# Patient Record
Sex: Male | Born: 1950
Health system: Southern US, Community
[De-identification: ages and names within clinical notes are randomized; demographics above are authoritative.]

## PROBLEM LIST (undated history)

## (undated) DIAGNOSIS — G809 Cerebral palsy, unspecified: Secondary | ICD-10-CM

## (undated) DIAGNOSIS — K0510 Chronic gingivitis, plaque induced: Secondary | ICD-10-CM

## (undated) DIAGNOSIS — R569 Unspecified convulsions: Secondary | ICD-10-CM

## (undated) DIAGNOSIS — D171 Benign lipomatous neoplasm of skin and subcutaneous tissue of trunk: Secondary | ICD-10-CM

## (undated) DIAGNOSIS — E119 Type 2 diabetes mellitus without complications: Secondary | ICD-10-CM

## (undated) HISTORY — PX: OTHER SURGICAL HISTORY: SHX169

---

## 2006-05-26 ENCOUNTER — Emergency Department (HOSPITAL_COMMUNITY): Admission: EM | Admit: 2006-05-26 | Discharge: 2006-05-26 | Payer: Self-pay | Admitting: Emergency Medicine

## 2011-05-29 ENCOUNTER — Emergency Department (HOSPITAL_COMMUNITY)
Admission: EM | Admit: 2011-05-29 | Discharge: 2011-05-29 | Disposition: A | Discharge status: Home / Self Care | Payer: Medicaid Other | Attending: Emergency Medicine | Admitting: Emergency Medicine

## 2011-05-29 ENCOUNTER — Encounter (HOSPITAL_COMMUNITY): Payer: Self-pay

## 2011-05-29 DIAGNOSIS — S01309A Unspecified open wound of unspecified ear, initial encounter: Secondary | ICD-10-CM | POA: Insufficient documentation

## 2011-05-29 DIAGNOSIS — Z79899 Other long term (current) drug therapy: Secondary | ICD-10-CM | POA: Insufficient documentation

## 2011-05-29 DIAGNOSIS — W19XXXA Unspecified fall, initial encounter: Secondary | ICD-10-CM

## 2011-05-29 DIAGNOSIS — W06XXXA Fall from bed, initial encounter: Secondary | ICD-10-CM | POA: Insufficient documentation

## 2011-05-29 DIAGNOSIS — S01311A Laceration without foreign body of right ear, initial encounter: Secondary | ICD-10-CM

## 2011-05-29 DIAGNOSIS — G809 Cerebral palsy, unspecified: Secondary | ICD-10-CM | POA: Insufficient documentation

## 2011-05-29 HISTORY — DX: Unspecified convulsions: R56.9

## 2011-05-29 HISTORY — DX: Cerebral palsy, unspecified: G80.9

## 2011-05-29 NOTE — ED Provider Notes (Signed)
History   This chart was scribed for Lyanne Co, MD by Clarita Crane. The patient was seen in room APA04/APA04 and the patient's care was started at 7:18AM.   CSN: [id number]  Arrival date & time 05/29/11  4098   First MD Initiated Contact with Patient 05/29/11 [phone]      Chief Complaint  Patient presents with  . Fall    (Consider location/radiation/quality/duration/timing/severity/associated sxs/prior treatment) HPI Jeremy Parks is a 61 y.o. male who presents to the Emergency Department to be evaluated after sustaining a fall this morning after attempting to get out of bed and having his foot slip from underneath him and falling backwards. Patient indicates striking the posterior aspect of his head on the bed during fall and sustaining moderate laceration to right ear in process of fall. Denies HA, LOC, neck pain, chest pain, abdominal pain, numbness, tingling, seizure, fever, nausea, vomiting. Patient with h/o cerebral palsy and seizures.  Past Medical History  Diagnosis Date  . Cerebral palsy   . Seizures     History reviewed. No pertinent past surgical history.  No family history on file.  History  Substance Use Topics  . Smoking status: Never Smoker   . Smokeless tobacco: Not on file  . Alcohol Use: No  -Lives in group home    Review of Systems 10 Systems reviewed and are negative for acute change except as noted in the HPI.  Allergies  Review of patient's allergies indicates no known allergies.  Home Medications   Current Outpatient Rx  Name Route Sig Dispense Refill  . CARBAMAZEPINE 200 MG PO TABS Oral Take 200 mg by mouth 2 (two) times daily.    . CHLORHEXIDINE GLUCONATE 0.12 % MT SOLN Mouth/Throat Use as directed 15 mLs in the mouth or throat 2 (two) times daily.    . IBUPROFEN 800 MG PO TABS Oral Take 800 mg by mouth every 6 (six) hours as needed.      BP 143/80  Pulse 63  Temp(Src) 97.6 F (36.4 C) (Oral)  Resp 20  Ht 5\' 9"  (1.753 m)  Wt 200 lb  (90.719 kg)  BMI 29.53 kg/m2  SpO2 100%  Physical Exam  Nursing note and vitals reviewed. Constitutional: He is oriented to person, place, and time. He appears well-developed and well-nourished.  HENT:  Head: Normocephalic.  Left Ear: External ear normal.       0.75cm laceration to right superior ear involving the helix. No evidence of hematoma to right ear noted.   Eyes: EOM are normal.  Neck: Normal range of motion. Neck supple.       C-spine non-tender.   Cardiovascular: Normal rate and regular rhythm.  Exam reveals no gallop and no friction rub.   No murmur heard. Pulmonary/Chest: Effort normal. No respiratory distress. He has no wheezes. He has no rales.  Abdominal: Soft. He exhibits no distension. There is no tenderness.  Musculoskeletal: Normal range of motion. He exhibits no tenderness.       No C-spine tenderness. No step-offs noted. FROM of all major joints. Chronic deformity of left ankle.   Neurological: He is alert and oriented to person, place, and time.       55 strength in major muscle groups bilaterally of both upper and lower extremities. No facial asymetry  Skin: Skin is warm and dry.  Psychiatric: He has a normal mood and affect.    ED Course  Procedures (including critical care time)  LACERATION REPAIR PROCEDURE NOTE The patient's  identification was confirmed and consent was obtained. This procedure was performed by Lyanne Co, MD at 7:27 AM.  Site: superior aspect of helix of right ear Sterile procedures observed Anesthetic used (type and amt): None Suture type/size: 5-0 Vicryl Length: 0.75cm # of Sutures: 4 Technique:Simple Interrupted  Complexity: Complex Antibx ointment applied Tetanus UTD or ordered  Site anesthetized, irrigated with NS, explored without evidence of foreign body, wound well approximated, site covered with dry, sterile dressing.  Patient tolerated procedure well without complications. Instructions for care discussed verbally  and patient provided with additional written instructions for homecare and f/u.  DIAGNOSTIC STUDIES: Oxygen Saturation is 100% on room air, normal by my interpretation.    COORDINATION OF CARE: 7:20AM- Patient advised of intent to perform laceration repair to right ear and consents to having procedure performed.  7:26AM-Jeremy Macy Larena Glassman, MD to bedside to perform laceration repair.  7:35AM- Patient informed of intent to d/c home. Patient agrees with plan set forth at this time.  Labs Reviewed - No data to display No results found.   1. Fall   2. Laceration of right ear, external       MDM  Isolated laceration to right external ear helix.  Stitches placed.  Tetanus up to date.  Mechanical fall.  No indication for head imaging.  No loss consciousness.  Normal neurologic exam.      I personally performed the services described in this documentation, which was scribed in my presence. The recorded information has been reviewed and considered.    Lyanne Co, MD 05/29/11 [phone]

## 2011-05-29 NOTE — ED Notes (Signed)
Brought to er by ems, pt stated he rolled out of bed, small laceration to right ear, denies loc

## 2011-05-29 NOTE — ED Notes (Signed)
[facility] [phone].  Gave report to Lupita Leash.  Called ems to transport.

## 2011-06-09 ENCOUNTER — Emergency Department (HOSPITAL_COMMUNITY): Payer: Medicaid Other

## 2011-06-09 ENCOUNTER — Encounter (HOSPITAL_COMMUNITY): Payer: Self-pay | Admitting: *Deleted

## 2011-06-09 ENCOUNTER — Emergency Department (HOSPITAL_COMMUNITY)
Admission: EM | Admit: 2011-06-09 | Discharge: 2011-06-10 | Disposition: A | Discharge status: Other Acute Inpatient Hospital | Payer: Medicaid Other | Attending: Emergency Medicine | Admitting: Emergency Medicine

## 2011-06-09 DIAGNOSIS — G809 Cerebral palsy, unspecified: Secondary | ICD-10-CM | POA: Insufficient documentation

## 2011-06-09 DIAGNOSIS — D179 Benign lipomatous neoplasm, unspecified: Secondary | ICD-10-CM

## 2011-06-09 DIAGNOSIS — D175 Benign lipomatous neoplasm of intra-abdominal organs: Secondary | ICD-10-CM | POA: Insufficient documentation

## 2011-06-09 DIAGNOSIS — R1900 Intra-abdominal and pelvic swelling, mass and lump, unspecified site: Secondary | ICD-10-CM | POA: Insufficient documentation

## 2011-06-09 DIAGNOSIS — R569 Unspecified convulsions: Secondary | ICD-10-CM | POA: Insufficient documentation

## 2011-06-09 LAB — DIFFERENTIAL
Basophils Absolute: 0 10*3/uL (ref 0.0–0.1)
Basophils Relative: 0 % (ref 0–1)
Eosinophils Absolute: 0.1 10*3/uL (ref 0.0–0.7)
Eosinophils Relative: 2 % (ref 0–5)
Lymphocytes Relative: 37 % (ref 12–46)
Lymphs Abs: 2.3 10*3/uL (ref 0.7–4.0)
Monocytes Absolute: 0.6 10*3/uL (ref 0.1–1.0)
Monocytes Relative: 10 % (ref 3–12)
Neutro Abs: 3.2 10*3/uL (ref 1.7–7.7)
Neutrophils Relative %: 51 % (ref 43–77)

## 2011-06-09 LAB — BASIC METABOLIC PANEL
BUN: 14 mg/dL (ref 6–23)
CO2: 31 mEq/L (ref 19–32)
Calcium: 10.5 mg/dL (ref 8.4–10.5)
Chloride: 99 mEq/L (ref 96–112)
Creatinine, Ser: 0.89 mg/dL (ref 0.50–1.35)
GFR calc Af Amer: >90 mL/min (ref >90)
GFR calc non Af Amer: >90 mL/min (ref >90)
Glucose, Bld: 129 mg/dL — ABNORMAL HIGH (ref 70–99)
Potassium: 4.7 mEq/L (ref 3.5–5.1)
Sodium: 137 mEq/L (ref 135–145)

## 2011-06-09 LAB — CBC
HCT: 46 % (ref 39.0–52.0)
Hemoglobin: 15.6 g/dL (ref 13.0–17.0)
MCH: 29.6 pg (ref 26.0–34.0)
MCHC: 33.9 g/dL (ref 30.0–36.0)
MCV: 87.3 fL (ref 78.0–100.0)
Platelets: 163 10*3/uL (ref 150–400)
RBC: 5.27 MIL/uL (ref 4.22–5.81)
RDW: 12.4 % (ref 11.5–15.5)
WBC: 6.2 10*3/uL (ref 4.0–10.5)

## 2011-06-09 MED ORDER — IOHEXOL 300 MG/ML  SOLN
100.0000 mL | Freq: Once | INTRAMUSCULAR | Status: AC | PRN
  Administered 2011-06-09: 100 mL via INTRAVENOUS

## 2011-06-09 NOTE — ED Provider Notes (Signed)
History    This chart was scribed for Jeremy Lennert, MD, MD by Smitty Pluck. The patient was seen in room APA04 and the patient's care was started at 7:06PM.   CSN: [id number]  Arrival date & time 06/09/11  1753   First MD Initiated Contact with Patient 06/09/11 1903      Chief Complaint  Patient presents with  . Abdominal Pain    (Consider location/radiation/quality/duration/timing/severity/associated sxs/prior treatment) The history is provided by the patient.   Jeremy Parks is a 61 y.o. male who presents to the Emergency Department complaining of abdominal swelling onset 2 weeks. Pt reports falling out of bed 2 weeks ago and landing on the floor. Pt denies nausea, vomiting and diarrhea. Pt is from [facility]. Pt reports that he does not have abdominal pain.   Past Medical History  Diagnosis Date  . Cerebral palsy   . Seizures     History reviewed. No pertinent past surgical history.  History reviewed. No pertinent family history.  History  Substance Use Topics  . Smoking status: Never Smoker   . Smokeless tobacco: Not on file  . Alcohol Use: No      Review of Systems  All other systems reviewed and are negative.  10 Systems reviewed and are negative for acute change except as noted in the HPI.   Allergies  Review of patient's allergies indicates no known allergies.  Home Medications   Current Outpatient Rx  Name Route Sig Dispense Refill  . CARBAMAZEPINE 200 MG PO TABS Oral Take 200 mg by mouth 2 (two) times daily.    . CHLORHEXIDINE GLUCONATE 0.12 % MT SOLN Mouth/Throat Use as directed 15 mLs in the mouth or throat 2 (two) times daily. **Swish 15 mls for 30 seconds then spit out. USE DAILY**    . IBUPROFEN 800 MG PO TABS Oral Take 800 mg by mouth every 6 (six) hours as needed.      BP 137/77  Pulse 91  Temp(Src) 97.1 F (36.2 C) (Oral)  Resp 17  Ht 5\' 8"  (1.727 m)  Wt 200 lb (90.719 kg)  BMI 30.41 kg/m2  SpO2  97%  Physical Exam  Nursing note and vitals reviewed. Constitutional: He is oriented to person, place, and time. He appears well-developed and well-nourished. No distress.  HENT:  Head: Normocephalic and atraumatic.  Eyes: Conjunctivae and EOM are normal. No scleral icterus.  Neck: Neck supple. No thyromegaly present.  Cardiovascular: Normal rate and regular rhythm.  Exam reveals no gallop and no friction rub.   No murmur heard. Pulmonary/Chest: No stridor. He has no wheezes. He has no rales. He exhibits no tenderness.  Abdominal: He exhibits mass (LUQ 4x4). He exhibits no distension. There is no tenderness. There is no rebound.  Musculoskeletal: Normal range of motion. He exhibits no edema.  Lymphadenopathy:    He has no cervical adenopathy.  Neurological: He is oriented to person, place, and time. Coordination normal.  Skin: No rash noted. No erythema.  Psychiatric: He has a normal mood and affect. His behavior is normal.    ED Course  Procedures (including critical care time) DIAGNOSTIC STUDIES: Oxygen Saturation is 97% on room air, normal by my interpretation.    COORDINATION OF CARE: 7:15 EDP discussed pt ED treatment course with pt   Labs Reviewed  BASIC METABOLIC PANEL - Abnormal; Notable for the following:    Glucose, Bld 129 (*)    All other components within normal limits  CBC  DIFFERENTIAL   Ct Abdomen Pelvis W Contrast  06/09/2011  *RADIOLOGY REPORT*  Clinical Data: Left upper quadrant swelling.  CT ABDOMEN AND PELVIS WITH CONTRAST  Technique:  Multidetector CT imaging of the abdomen and pelvis was performed following the standard protocol during bolus administration of intravenous contrast.  Contrast: OMNIPAQUE IOHEXOL 300 MG/ML IV SOLN  Comparison: None.  Findings: Limited images through the lung bases demonstrate no significant appreciable abnormality. The heart size is within normal limits. No pleural or pericardial effusion.  Unremarkable liver, spleen,  pancreas, adrenal glands.  Symmetric renal enhancement.  No hydronephrosis or hydroureter.  Small bowel loops are normal course and caliber.  There is a moderate stool burden.  The proximal sigmoid colon is redundant and distended with air, up to 5.9 cm.  There is an abrupt transition in the left lower abdomen (image 50 of series 2).  Distal to this point, the sigmoid colon is entirely decompressed.  No mass, mesenteric twisting, or significant pericolonic inflammation identified in this region.  Mild colonic diverticulosis.  No free intraperitoneal air or fluid.  Normal caliber vasculature.  Thin-walled bladder.  Fat containing right greater than left inguinal hernias.  Multilevel degenerative changes of the imaged spine. No acute or aggressive appearing osseous lesion.  IMPRESSION: Redundant sigmoid colon with marked gaseous distension proximally and abrupt transition in the left lower abdomen to decompressed colon distally. This is a nonspecific finding on this static examination. No twisting to confirm volvulus.  Partial obstruction due to stricture or adhesion not excluded. Recommend a follow-up KUB to document contrast passage into the rectum and/or definitive characterization with colonoscopy to exclude an underlying lesion.  Original Report Authenticated By: Waneta Martins, M.D.     No diagnosis found.  I spoke with the radiologist and he felt that the swelling in his intestines was probably due to chronic constipation issues. The patient does not have any abdominal pain or tenderness. He just has the large fatty tumor in his left upper abd  MDM  Fatty tumor  The chart was scribed for me under my direct supervision.  I personally performed the history, physical, and medical decision making and all procedures in the evaluation of this patient.Jeremy Lennert, MD 06/09/11 2242

## 2011-06-09 NOTE — ED Notes (Signed)
Phoned [facility] about patient return to facility. They state to have ems return him to faclility due to them not having transport after eleven at night

## 2011-06-09 NOTE — ED Notes (Signed)
Swelling of Luq, noticed today.Pt is from [facility].   No NVD.

## 2012-03-14 DIAGNOSIS — G809 Cerebral palsy, unspecified: Secondary | ICD-10-CM | POA: Diagnosis not present

## 2012-03-14 DIAGNOSIS — R569 Unspecified convulsions: Secondary | ICD-10-CM | POA: Diagnosis not present

## 2012-03-21 DIAGNOSIS — R569 Unspecified convulsions: Secondary | ICD-10-CM | POA: Diagnosis not present

## 2012-05-12 DIAGNOSIS — B351 Tinea unguium: Secondary | ICD-10-CM | POA: Diagnosis not present

## 2012-05-12 DIAGNOSIS — M79609 Pain in unspecified limb: Secondary | ICD-10-CM | POA: Diagnosis not present

## 2012-07-18 DIAGNOSIS — G808 Other cerebral palsy: Secondary | ICD-10-CM | POA: Diagnosis not present

## 2012-07-18 DIAGNOSIS — R569 Unspecified convulsions: Secondary | ICD-10-CM | POA: Diagnosis not present

## 2012-07-19 DIAGNOSIS — G809 Cerebral palsy, unspecified: Secondary | ICD-10-CM | POA: Diagnosis not present

## 2012-07-19 DIAGNOSIS — R569 Unspecified convulsions: Secondary | ICD-10-CM | POA: Diagnosis not present

## 2012-07-19 DIAGNOSIS — E785 Hyperlipidemia, unspecified: Secondary | ICD-10-CM | POA: Diagnosis not present

## 2012-07-25 DIAGNOSIS — M79609 Pain in unspecified limb: Secondary | ICD-10-CM | POA: Diagnosis not present

## 2012-07-25 DIAGNOSIS — B351 Tinea unguium: Secondary | ICD-10-CM | POA: Diagnosis not present

## 2012-08-08 ENCOUNTER — Emergency Department (HOSPITAL_COMMUNITY)
Admission: EM | Admit: 2012-08-08 | Discharge: 2012-08-09 | Disposition: A | Discharge status: Other Acute Inpatient Hospital | Payer: Medicare Other | Attending: Emergency Medicine | Admitting: Emergency Medicine

## 2012-08-08 ENCOUNTER — Emergency Department (HOSPITAL_COMMUNITY): Payer: Medicare Other

## 2012-08-08 ENCOUNTER — Encounter (HOSPITAL_COMMUNITY): Payer: Self-pay | Admitting: Emergency Medicine

## 2012-08-08 DIAGNOSIS — R0609 Other forms of dyspnea: Secondary | ICD-10-CM | POA: Diagnosis not present

## 2012-08-08 DIAGNOSIS — R0989 Other specified symptoms and signs involving the circulatory and respiratory systems: Secondary | ICD-10-CM | POA: Insufficient documentation

## 2012-08-08 DIAGNOSIS — J3489 Other specified disorders of nose and nasal sinuses: Secondary | ICD-10-CM | POA: Insufficient documentation

## 2012-08-08 DIAGNOSIS — G40802 Other epilepsy, not intractable, without status epilepticus: Secondary | ICD-10-CM | POA: Insufficient documentation

## 2012-08-08 DIAGNOSIS — M545 Low back pain, unspecified: Secondary | ICD-10-CM | POA: Insufficient documentation

## 2012-08-08 DIAGNOSIS — G809 Cerebral palsy, unspecified: Secondary | ICD-10-CM | POA: Diagnosis not present

## 2012-08-08 DIAGNOSIS — R52 Pain, unspecified: Secondary | ICD-10-CM | POA: Diagnosis not present

## 2012-08-08 DIAGNOSIS — R05 Cough: Secondary | ICD-10-CM | POA: Diagnosis not present

## 2012-08-08 DIAGNOSIS — J9819 Other pulmonary collapse: Secondary | ICD-10-CM | POA: Diagnosis not present

## 2012-08-08 DIAGNOSIS — R059 Cough, unspecified: Secondary | ICD-10-CM | POA: Diagnosis not present

## 2012-08-08 DIAGNOSIS — J189 Pneumonia, unspecified organism: Secondary | ICD-10-CM

## 2012-08-08 DIAGNOSIS — R109 Unspecified abdominal pain: Secondary | ICD-10-CM

## 2012-08-08 DIAGNOSIS — Z79899 Other long term (current) drug therapy: Secondary | ICD-10-CM | POA: Diagnosis not present

## 2012-08-08 DIAGNOSIS — M549 Dorsalgia, unspecified: Secondary | ICD-10-CM | POA: Diagnosis not present

## 2012-08-08 DIAGNOSIS — K4090 Unilateral inguinal hernia, without obstruction or gangrene, not specified as recurrent: Secondary | ICD-10-CM | POA: Diagnosis not present

## 2012-08-08 MED ORDER — OXYMETAZOLINE HCL 0.05 % NA SOLN
2.0000 | Freq: Once | NASAL | Status: AC
  Administered 2012-08-08: 2 via NASAL
  Filled 2012-08-08: qty 15

## 2012-08-08 MED ORDER — HYDROCODONE-ACETAMINOPHEN 5-325 MG PO TABS
2.0000 | ORAL_TABLET | Freq: Once | ORAL | Status: AC
  Administered 2012-08-08: 2 via ORAL
  Filled 2012-08-08: qty 2

## 2012-08-08 NOTE — ED Notes (Signed)
Pt c/o left sided lower back pain and congestion.

## 2012-08-08 NOTE — ED Provider Notes (Signed)
History     CSN: [id number]  Arrival date & time 08/08/12  2051   First MD Initiated Contact with Patient 08/08/12 2221      Chief Complaint  Patient presents with  . Back Pain  . Nasal Congestion    (Consider location/radiation/quality/duration/timing/severity/associated sxs/prior treatment) Patient is a 62 y.o. male presenting with back pain. The history is provided by the patient.  Back Pain Location:  Lumbar spine Quality:  Aching Pain severity:  Moderate Pain is:  Same all the time Onset quality:  Gradual Duration:  2 days Timing:  Intermittent Progression:  Worsening Chronicity:  Chronic Context comment:  Pt has hx of back problems. Worse this weekend with cough Relieved by:  Nothing Worsened by:  Coughing and movement Ineffective treatments:  None tried Associated symptoms: no abdominal pain, no bladder incontinence, no bowel incontinence, no chest pain, no dysuria and no perianal numbness     Past Medical History  Diagnosis Date  . Cerebral palsy   . Seizures     History reviewed. No pertinent past surgical history.  History reviewed. No pertinent family history.  History  Substance Use Topics  . Smoking status: Never Smoker   . Smokeless tobacco: Not on file  . Alcohol Use: No      Review of Systems  Constitutional: Negative for activity change.       All ROS Neg except as noted in HPI  HENT: Negative for nosebleeds and neck pain.   Eyes: Negative for photophobia and discharge.  Respiratory: Negative for cough, shortness of breath and wheezing.   Cardiovascular: Negative for chest pain and palpitations.  Gastrointestinal: Negative for abdominal pain, blood in stool and bowel incontinence.  Genitourinary: Negative for bladder incontinence, dysuria, frequency and hematuria.  Musculoskeletal: Positive for back pain. Negative for arthralgias.  Skin: Negative.   Neurological: Positive for seizures. Negative for dizziness and speech difficulty.   Psychiatric/Behavioral: Negative for hallucinations and confusion.    Allergies  Review of patient's allergies indicates no known allergies.  Home Medications   Current Outpatient Rx  Name  Route  Sig  Dispense  Refill  . carbamazepine (TEGRETOL) 200 MG tablet   Oral   Take 200 mg by mouth 2 (two) times daily.         . chlorhexidine (PERIDEX) 0.12 % solution   Mouth/Throat   Use as directed 15 mLs in the mouth or throat 2 (two) times daily. **Swish 15 mls for 30 seconds then spit out. USE DAILY**         . ibuprofen (ADVIL,MOTRIN) 800 MG tablet   Oral   Take 800 mg by mouth every 6 (six) hours as needed.           BP 129/77  Pulse 84  Temp(Src) 97.7 F (36.5 C) (Oral)  Resp 20  Ht 5\' 6"  (1.676 m)  Wt 220 lb (99.791 kg)  BMI 35.53 kg/m2  SpO2 95%  Physical Exam  Nursing note and vitals reviewed. Constitutional: He is oriented to person, place, and time. He appears well-developed and well-nourished.  Non-toxic appearance.  HENT:  Head: Normocephalic.  Right Ear: Tympanic membrane and external ear normal.  Left Ear: Tympanic membrane and external ear normal.  Nasal congestion.  Eyes: EOM and lids are normal. Pupils are equal, round, and reactive to light.  Neck: Normal range of motion. Neck supple. Carotid bruit is not present.  Cardiovascular: Normal rate, regular rhythm, normal heart sounds, intact distal pulses and normal pulses.  Pulmonary/Chest: No respiratory distress. He has rhonchi.  Abdominal: Soft. Bowel sounds are normal. There is no tenderness. There is no guarding.  Musculoskeletal: Normal range of motion.  Lower lumbar area pain with change of position and with palpation. No hot areas. No palpable step off.  Lymphadenopathy:       Head (right side): No submandibular adenopathy present.       Head (left side): No submandibular adenopathy present.    He has no cervical adenopathy.  Neurological: He is alert and oriented to person, place, and  time. He has normal strength. No cranial nerve deficit or sensory deficit.  Skin: Skin is warm and dry.  Psychiatric: He has a normal mood and affect. His speech is normal.    ED Course  Procedures (including critical care time)  Labs Reviewed - No data to display No results found.  Pulse oximetry 95% on room air. Within normal limits by my interpretation. No diagnosis found.    MDM  I have reviewed nursing notes, vital signs, and all appropriate lab and imaging results for this patient.  Patient presents to the emergency department with left side of the lower back pain. He also has nasal and chest area congestion. He presents to the emergency department for evaluation of these problems.  The chest x-ray showed the patient had a mild patchy bibasilar airspace opacity which was of concern for pneumonia. A lumbar spine film showed no evidence of fracture or subluxation of the lumbar spine. It was noted that there was a significant amount of stool and gas throughout the colon and the question was raised as to the possibility of ileus or maybe even an early obstruction. Given the patient's complicated medical history it was the opinion that a CT scan should probably be obtained to rule out any additional problem.  A noncontrasted CT abdomen and pelvis was obtained and there was no acute abnormality seen within the abdomen or pelvis there is noted bilateral inguinal hernias present. There was also some thickening in the gallbladder and the question of sludge in the gallbladder was raised. It was determined that this could be rechecked in a nonemergent setting.  Plan at this time is for the patient to receive Rocephin 1 g intramuscularly in the emergency department. As well as an oral dose of Zithromax. Patient is given a prescription for Zithromax and a prescription for albuterol inhaler. The patient is to be seen by his primary physician in 3-5 days for recheck and close  monitoring.      Kathie Dike, PA-C 08/09/12 0030

## 2012-08-09 MED ORDER — HYDROCODONE-ACETAMINOPHEN 5-325 MG PO TABS: 1.0000 | ORAL_TABLET | ORAL | Status: DC | PRN

## 2012-08-09 MED ORDER — AZITHROMYCIN 250 MG PO TABS
500.0000 mg | ORAL_TABLET | Freq: Once | ORAL | Status: AC
  Administered 2012-08-09: 500 mg via ORAL
  Filled 2012-08-09: qty 2

## 2012-08-09 MED ORDER — AZITHROMYCIN 250 MG PO TABS: ORAL_TABLET | ORAL | Status: DC

## 2012-08-09 MED ORDER — ALBUTEROL SULFATE HFA 108 (90 BASE) MCG/ACT IN AERS
2.0000 | INHALATION_SPRAY | RESPIRATORY_TRACT | Status: DC
  Administered 2012-08-09: 2 via RESPIRATORY_TRACT
  Filled 2012-08-09: qty 6.7

## 2012-08-09 MED ORDER — CEFTRIAXONE SODIUM 1 G IJ SOLR
1.0000 g | Freq: Once | INTRAMUSCULAR | Status: AC
  Administered 2012-08-09: 1 g via INTRAMUSCULAR
  Filled 2012-08-09: qty 10

## 2012-08-09 NOTE — ED Provider Notes (Signed)
Medical screening examination/treatment/procedure(s) were performed by non-physician practitioner and as supervising physician I was immediately available for consultation/collaboration.   Joya Gaskins, MD 08/09/12 [phone]

## 2012-08-15 DIAGNOSIS — R569 Unspecified convulsions: Secondary | ICD-10-CM | POA: Diagnosis not present

## 2012-08-15 DIAGNOSIS — J189 Pneumonia, unspecified organism: Secondary | ICD-10-CM | POA: Diagnosis not present

## 2012-08-15 DIAGNOSIS — G809 Cerebral palsy, unspecified: Secondary | ICD-10-CM | POA: Diagnosis not present

## 2012-09-06 DIAGNOSIS — J189 Pneumonia, unspecified organism: Secondary | ICD-10-CM | POA: Diagnosis not present

## 2012-09-06 DIAGNOSIS — R569 Unspecified convulsions: Secondary | ICD-10-CM | POA: Diagnosis not present

## 2012-09-06 DIAGNOSIS — G809 Cerebral palsy, unspecified: Secondary | ICD-10-CM | POA: Diagnosis not present

## 2012-09-07 ENCOUNTER — Ambulatory Visit (HOSPITAL_COMMUNITY)
Admission: RE | Admit: 2012-09-07 | Discharge: 2012-09-07 | Disposition: A | Discharge status: Home / Self Care | Payer: Medicare Other | Source: Ambulatory Visit | Attending: Pulmonary Disease | Admitting: Pulmonary Disease

## 2012-09-07 ENCOUNTER — Other Ambulatory Visit (HOSPITAL_COMMUNITY): Payer: Self-pay | Admitting: Pulmonary Disease

## 2012-09-07 DIAGNOSIS — J189 Pneumonia, unspecified organism: Secondary | ICD-10-CM | POA: Insufficient documentation

## 2012-09-16 DIAGNOSIS — G808 Other cerebral palsy: Secondary | ICD-10-CM | POA: Diagnosis not present

## 2012-09-16 DIAGNOSIS — R569 Unspecified convulsions: Secondary | ICD-10-CM | POA: Diagnosis not present

## 2012-10-12 DIAGNOSIS — B351 Tinea unguium: Secondary | ICD-10-CM | POA: Diagnosis not present

## 2012-10-12 DIAGNOSIS — M79609 Pain in unspecified limb: Secondary | ICD-10-CM | POA: Diagnosis not present

## 2012-11-15 DIAGNOSIS — G808 Other cerebral palsy: Secondary | ICD-10-CM | POA: Diagnosis not present

## 2012-11-15 DIAGNOSIS — R569 Unspecified convulsions: Secondary | ICD-10-CM | POA: Diagnosis not present

## 2012-12-28 DIAGNOSIS — M79609 Pain in unspecified limb: Secondary | ICD-10-CM | POA: Diagnosis not present

## 2012-12-28 DIAGNOSIS — B351 Tinea unguium: Secondary | ICD-10-CM | POA: Diagnosis not present

## 2013-01-14 DIAGNOSIS — G808 Other cerebral palsy: Secondary | ICD-10-CM | POA: Diagnosis not present

## 2013-01-14 DIAGNOSIS — R569 Unspecified convulsions: Secondary | ICD-10-CM | POA: Diagnosis not present

## 2013-01-18 DIAGNOSIS — G808 Other cerebral palsy: Secondary | ICD-10-CM | POA: Diagnosis not present

## 2013-01-18 DIAGNOSIS — R569 Unspecified convulsions: Secondary | ICD-10-CM | POA: Diagnosis not present

## 2013-01-30 DIAGNOSIS — Z23 Encounter for immunization: Secondary | ICD-10-CM | POA: Diagnosis not present

## 2013-03-08 DIAGNOSIS — N390 Urinary tract infection, site not specified: Secondary | ICD-10-CM | POA: Diagnosis not present

## 2013-03-08 DIAGNOSIS — R21 Rash and other nonspecific skin eruption: Secondary | ICD-10-CM | POA: Diagnosis not present

## 2013-03-08 DIAGNOSIS — R569 Unspecified convulsions: Secondary | ICD-10-CM | POA: Diagnosis not present

## 2013-03-10 DIAGNOSIS — N390 Urinary tract infection, site not specified: Secondary | ICD-10-CM | POA: Diagnosis not present

## 2013-03-15 DIAGNOSIS — R569 Unspecified convulsions: Secondary | ICD-10-CM | POA: Diagnosis not present

## 2013-03-15 DIAGNOSIS — G808 Other cerebral palsy: Secondary | ICD-10-CM | POA: Diagnosis not present

## 2013-06-21 DIAGNOSIS — M79609 Pain in unspecified limb: Secondary | ICD-10-CM | POA: Diagnosis not present

## 2013-06-21 DIAGNOSIS — B351 Tinea unguium: Secondary | ICD-10-CM | POA: Diagnosis not present

## 2013-07-13 DIAGNOSIS — R569 Unspecified convulsions: Secondary | ICD-10-CM | POA: Diagnosis not present

## 2013-07-13 DIAGNOSIS — G808 Other cerebral palsy: Secondary | ICD-10-CM | POA: Diagnosis not present

## 2013-07-18 DIAGNOSIS — G809 Cerebral palsy, unspecified: Secondary | ICD-10-CM | POA: Diagnosis not present

## 2013-07-18 DIAGNOSIS — E785 Hyperlipidemia, unspecified: Secondary | ICD-10-CM | POA: Diagnosis not present

## 2013-07-18 DIAGNOSIS — R569 Unspecified convulsions: Secondary | ICD-10-CM | POA: Diagnosis not present

## 2013-09-11 DIAGNOSIS — R569 Unspecified convulsions: Secondary | ICD-10-CM | POA: Diagnosis not present

## 2013-09-11 DIAGNOSIS — G808 Other cerebral palsy: Secondary | ICD-10-CM | POA: Diagnosis not present

## 2013-09-18 DIAGNOSIS — B351 Tinea unguium: Secondary | ICD-10-CM | POA: Diagnosis not present

## 2013-09-18 DIAGNOSIS — M79609 Pain in unspecified limb: Secondary | ICD-10-CM | POA: Diagnosis not present

## 2013-11-22 DIAGNOSIS — M79609 Pain in unspecified limb: Secondary | ICD-10-CM | POA: Diagnosis not present

## 2013-11-22 DIAGNOSIS — B351 Tinea unguium: Secondary | ICD-10-CM | POA: Diagnosis not present

## 2014-02-13 DIAGNOSIS — Z23 Encounter for immunization: Secondary | ICD-10-CM | POA: Diagnosis not present

## 2014-02-16 ENCOUNTER — Encounter (HOSPITAL_COMMUNITY): Payer: Self-pay | Admitting: Emergency Medicine

## 2014-02-16 ENCOUNTER — Emergency Department (HOSPITAL_COMMUNITY)
Admission: EM | Admit: 2014-02-16 | Discharge: 2014-02-16 | Disposition: A | Discharge status: Home / Self Care | Payer: Medicare Other | Attending: Emergency Medicine | Admitting: Emergency Medicine

## 2014-02-16 DIAGNOSIS — R110 Nausea: Secondary | ICD-10-CM

## 2014-02-16 DIAGNOSIS — R112 Nausea with vomiting, unspecified: Secondary | ICD-10-CM | POA: Diagnosis not present

## 2014-02-16 DIAGNOSIS — G40909 Epilepsy, unspecified, not intractable, without status epilepticus: Secondary | ICD-10-CM | POA: Diagnosis not present

## 2014-02-16 DIAGNOSIS — R030 Elevated blood-pressure reading, without diagnosis of hypertension: Secondary | ICD-10-CM | POA: Diagnosis not present

## 2014-02-16 DIAGNOSIS — Z79899 Other long term (current) drug therapy: Secondary | ICD-10-CM | POA: Insufficient documentation

## 2014-02-16 DIAGNOSIS — Z792 Long term (current) use of antibiotics: Secondary | ICD-10-CM | POA: Diagnosis not present

## 2014-02-16 DIAGNOSIS — Z8669 Personal history of other diseases of the nervous system and sense organs: Secondary | ICD-10-CM | POA: Insufficient documentation

## 2014-02-16 DIAGNOSIS — R5383 Other fatigue: Secondary | ICD-10-CM | POA: Diagnosis not present

## 2014-02-16 LAB — COMPREHENSIVE METABOLIC PANEL
ALT: 33 U/L (ref 0–53)
AST: 22 U/L (ref 0–37)
Albumin: 4 g/dL (ref 3.5–5.2)
Alkaline Phosphatase: 160 U/L — ABNORMAL HIGH (ref 39–117)
Anion gap: 13 (ref 5–15)
BUN: 14 mg/dL (ref 6–23)
CO2: 26 mEq/L (ref 19–32)
Calcium: 9.6 mg/dL (ref 8.4–10.5)
Chloride: 100 mEq/L (ref 96–112)
Creatinine, Ser: 0.74 mg/dL (ref 0.50–1.35)
GFR calc Af Amer: >90 mL/min (ref >90)
GFR calc non Af Amer: >90 mL/min (ref >90)
Glucose, Bld: 365 mg/dL — ABNORMAL HIGH (ref 70–99)
Potassium: 4.4 mEq/L (ref 3.7–5.3)
Sodium: 139 mEq/L (ref 137–147)
Total Bilirubin: 0.3 mg/dL (ref 0.3–1.2)
Total Protein: 8.3 g/dL (ref 6.0–8.3)

## 2014-02-16 LAB — CBC WITH DIFFERENTIAL/PLATELET
Basophils Absolute: 0 10*3/uL (ref 0.0–0.1)
Basophils Relative: 0 % (ref 0–1)
Eosinophils Absolute: 0.2 10*3/uL (ref 0.0–0.7)
Eosinophils Relative: 3 % (ref 0–5)
HCT: 42.4 % (ref 39.0–52.0)
Hemoglobin: 15.2 g/dL (ref 13.0–17.0)
Lymphocytes Relative: 31 % (ref 12–46)
Lymphs Abs: 2 10*3/uL (ref 0.7–4.0)
MCH: 30 pg (ref 26.0–34.0)
MCHC: 35.8 g/dL (ref 30.0–36.0)
MCV: 83.8 fL (ref 78.0–100.0)
Monocytes Absolute: 0.7 10*3/uL (ref 0.1–1.0)
Monocytes Relative: 11 % (ref 3–12)
Neutro Abs: 3.6 10*3/uL (ref 1.7–7.7)
Neutrophils Relative %: 56 % (ref 43–77)
Platelets: 170 10*3/uL (ref 150–400)
RBC: 5.06 MIL/uL (ref 4.22–5.81)
RDW: 12.4 % (ref 11.5–15.5)
WBC: 6.4 10*3/uL (ref 4.0–10.5)

## 2014-02-16 MED ORDER — ONDANSETRON HCL 4 MG/2ML IJ SOLN
4.0000 mg | Freq: Once | INTRAMUSCULAR | Status: AC
  Administered 2014-02-16: 4 mg via INTRAVENOUS
  Filled 2014-02-16: qty 2

## 2014-02-16 MED ORDER — LIDOCAINE HCL (PF) 2 % IJ SOLN
INTRAMUSCULAR | Status: AC
  Filled 2014-02-16: qty 10

## 2014-02-16 MED ORDER — ONDANSETRON 4 MG PO TBDP: 4.0000 mg | ORAL_TABLET | Freq: Three times a day (TID) | ORAL | Status: DC | PRN

## 2014-02-16 NOTE — ED Notes (Signed)
[facility] called for pt d/c transportation.

## 2014-02-16 NOTE — ED Notes (Signed)
MD at bedside. 

## 2014-02-16 NOTE — Discharge Instructions (Signed)
Take this Zofran for the nausea and vomiting. Labs here without significant abnormalities. Return for any new or worse symptoms. Followup with your doctor as needed.

## 2014-02-16 NOTE — ED Notes (Signed)
Per EMS: pt wanted to come get checked out. Got a flu shot earlier this week, has been vomiting after eating x 2 days.

## 2014-02-16 NOTE — ED Provider Notes (Signed)
CSN: [id number]     Arrival date & time 02/16/14  0309 History   First MD Initiated Contact with Patient 02/16/14 0522     Chief Complaint  Patient presents with  . Emesis     (Consider location/radiation/quality/duration/timing/severity/associated sxs/prior Treatment) Patient is a 63 y.o. male presenting with vomiting. The history is provided by the patient and the EMS personnel.  Emesis Associated symptoms: no abdominal pain, no diarrhea, no headaches and no myalgias    patient with history of sural palsy and seizures. Patient on Tegretol for the seizures. Patient has had nausea and vomiting for the past 2 days not frequently but mostly associated with meals. Also has not been feeling well patient a flu shot earlier in the week. Patient denies any sniffing abdominal pain. Denies fevers. In the upper respiratory symptoms.  Past Medical History  Diagnosis Date  . Cerebral palsy   . Seizures    History reviewed. No pertinent past surgical history. No family history on file. History  Substance Use Topics  . Smoking status: Never Smoker   . Smokeless tobacco: Not on file  . Alcohol Use: No    Review of Systems  Constitutional: Positive for fatigue. Negative for fever.  HENT: Negative for congestion.   Eyes: Negative for redness.  Respiratory: Negative for shortness of breath.   Cardiovascular: Negative for chest pain.  Gastrointestinal: Positive for nausea and vomiting. Negative for abdominal pain and diarrhea.  Genitourinary: Negative for dysuria.  Musculoskeletal: Negative for myalgias.  Skin: Negative for rash.  Neurological: Negative for headaches.  Hematological: Does not bruise/bleed easily.  Psychiatric/Behavioral: Negative for confusion.      Allergies  Review of patient's allergies indicates no known allergies.  Home Medications   Prior to Admission medications   Medication Sig Start Date End Date Taking? Authorizing Provider  azithromycin (ZITHROMAX) 250  MG tablet Take one tab daily with food until all taken. 08/09/12   Lenox Ahr, PA-C  carbamazepine (TEGRETOL) 200 MG tablet Take 200 mg by mouth 2 (two) times daily.    Historical Provider, MD  chlorhexidine (PERIDEX) 0.12 % solution Use as directed 15 mLs in the mouth or throat 2 (two) times daily. **Swish 15 mls for 30 seconds then spit out. USE DAILY**    Historical Provider, MD  HYDROcodone-acetaminophen (NORCO/VICODIN) 5-325 MG per tablet Take 1 tablet by mouth every 4 (four) hours as needed for pain. 08/09/12   Lenox Ahr, PA-C  ibuprofen (ADVIL,MOTRIN) 800 MG tablet Take 800 mg by mouth every 6 (six) hours as needed.    Historical Provider, MD  ondansetron (ZOFRAN ODT) 4 MG disintegrating tablet Take 1 tablet (4 mg total) by mouth every 8 (eight) hours as needed. 02/16/14   Fredia Sorrow, MD   BP 115/78  Pulse 76  Temp(Src) 97.7 F (36.5 C) (Oral)  Resp 16  Ht 5\' 10"  (1.778 m)  Wt 175 lb (79.379 kg)  BMI 25.11 kg/m2  SpO2 100% Physical Exam  Nursing note and vitals reviewed. Constitutional: He is oriented to person, place, and time. He appears well-nourished. No distress.  HENT:  Head: Normocephalic and atraumatic.  Mouth/Throat: Oropharynx is clear and moist.  Eyes: Conjunctivae and EOM are normal. Pupils are equal, round, and reactive to light.  Neck: Normal range of motion. Neck supple.  Cardiovascular: Normal rate, regular rhythm and normal heart sounds.   No murmur heard. Pulmonary/Chest: Effort normal and breath sounds normal. No respiratory distress.  Abdominal: Soft. Bowel sounds are normal. There  is no tenderness.  Musculoskeletal: He exhibits no edema.  Neurological: He is alert and oriented to person, place, and time. No cranial nerve deficit. He exhibits normal muscle tone. Coordination normal.  Skin: Skin is warm. No rash noted.    ED Course  Procedures (including critical care time) Labs Review Labs Reviewed  COMPREHENSIVE METABOLIC PANEL - Abnormal;  Notable for the following:    Glucose, Bld 365 (*)    Alkaline Phosphatase 160 (*)    All other components within normal limits  CBC WITH DIFFERENTIAL   Results for orders placed during the hospital encounter of 02/16/14  CBC WITH DIFFERENTIAL      Result Value Ref Range   WBC 6.4  4.0 - 10.5 K/uL   RBC 5.06  4.22 - 5.81 MIL/uL   Hemoglobin 15.2  13.0 - 17.0 g/dL   HCT 42.4  39.0 - 52.0 %   MCV 83.8  78.0 - 100.0 fL   MCH 30.0  26.0 - 34.0 pg   MCHC 35.8  30.0 - 36.0 g/dL   RDW 12.4  11.5 - 15.5 %   Platelets 170  150 - 400 K/uL   Neutrophils Relative % 56  43 - 77 %   Neutro Abs 3.6  1.7 - 7.7 K/uL   Lymphocytes Relative 31  12 - 46 %   Lymphs Abs 2.0  0.7 - 4.0 K/uL   Monocytes Relative 11  3 - 12 %   Monocytes Absolute 0.7  0.1 - 1.0 K/uL   Eosinophils Relative 3  0 - 5 %   Eosinophils Absolute 0.2  0.0 - 0.7 K/uL   Basophils Relative 0  0 - 1 %   Basophils Absolute 0.0  0.0 - 0.1 K/uL  COMPREHENSIVE METABOLIC PANEL      Result Value Ref Range   Sodium 139  137 - 147 mEq/L   Potassium 4.4  3.7 - 5.3 mEq/L   Chloride 100  96 - 112 mEq/L   CO2 26  19 - 32 mEq/L   Glucose, Bld 365 (*) 70 - 99 mg/dL   BUN 14  6 - 23 mg/dL   Creatinine, Ser 0.74  0.50 - 1.35 mg/dL   Calcium 9.6  8.4 - 10.5 mg/dL   Total Protein 8.3  6.0 - 8.3 g/dL   Albumin 4.0  3.5 - 5.2 g/dL   AST 22  0 - 37 U/L   ALT 33  0 - 53 U/L   Alkaline Phosphatase 160 (*) 39 - 117 U/L   Total Bilirubin 0.3  0.3 - 1.2 mg/dL   GFR calc non Af Amer >90  >90 mL/min   GFR calc Af Amer >90  >90 mL/min   Anion gap 13  5 - 15     Imaging Review No results found.   EKG Interpretation None      MDM   Final diagnoses:  Nausea  Nausea and vomiting, vomiting of unspecified type    Patient arrived wanting to get checked out. Brought in by EMS. Patient is a shot earlier this week and he has had some nausea and vomiting after eating for 2 days. Not vomiting that frequently. No blood in it. Patient thinks  something to do with flu shot. Labs here without significant abnormalities. Patient improved with antinausea medicine. Abdomen soft nontender no evidence of any acute surgical process. Patient nontoxic no acute distress. Patient is on Tegretol. Level not checked. Suspect not related to toxicity.    Kinsley Nicklaus  Rogene Houston, MD 02/16/14 [phone]

## 2014-02-19 DIAGNOSIS — B351 Tinea unguium: Secondary | ICD-10-CM | POA: Diagnosis not present

## 2014-02-19 DIAGNOSIS — M79675 Pain in left toe(s): Secondary | ICD-10-CM | POA: Diagnosis not present

## 2014-02-19 DIAGNOSIS — M79674 Pain in right toe(s): Secondary | ICD-10-CM | POA: Diagnosis not present

## 2014-03-05 DIAGNOSIS — G809 Cerebral palsy, unspecified: Secondary | ICD-10-CM | POA: Diagnosis not present

## 2014-03-05 DIAGNOSIS — R569 Unspecified convulsions: Secondary | ICD-10-CM | POA: Diagnosis not present

## 2014-03-10 DIAGNOSIS — G801 Spastic diplegic cerebral palsy: Secondary | ICD-10-CM | POA: Diagnosis not present

## 2014-03-10 DIAGNOSIS — R569 Unspecified convulsions: Secondary | ICD-10-CM | POA: Diagnosis not present

## 2014-06-05 DIAGNOSIS — M79675 Pain in left toe(s): Secondary | ICD-10-CM | POA: Diagnosis not present

## 2014-06-05 DIAGNOSIS — B351 Tinea unguium: Secondary | ICD-10-CM | POA: Diagnosis not present

## 2014-07-25 ENCOUNTER — Emergency Department (HOSPITAL_COMMUNITY)
Admission: EM | Admit: 2014-07-25 | Discharge: 2014-07-25 | Disposition: A | Discharge status: Home / Self Care | Payer: Medicare Other | Attending: Emergency Medicine | Admitting: Emergency Medicine

## 2014-07-25 ENCOUNTER — Emergency Department (HOSPITAL_COMMUNITY): Payer: Medicare Other

## 2014-07-25 ENCOUNTER — Encounter (HOSPITAL_COMMUNITY): Payer: Self-pay | Admitting: Cardiology

## 2014-07-25 DIAGNOSIS — R40241 Glasgow coma scale score 13-15: Secondary | ICD-10-CM | POA: Diagnosis not present

## 2014-07-25 DIAGNOSIS — R279 Unspecified lack of coordination: Secondary | ICD-10-CM | POA: Diagnosis not present

## 2014-07-25 DIAGNOSIS — K5904 Chronic idiopathic constipation: Secondary | ICD-10-CM

## 2014-07-25 DIAGNOSIS — K5900 Constipation, unspecified: Secondary | ICD-10-CM | POA: Diagnosis not present

## 2014-07-25 DIAGNOSIS — G40909 Epilepsy, unspecified, not intractable, without status epilepticus: Secondary | ICD-10-CM | POA: Insufficient documentation

## 2014-07-25 DIAGNOSIS — D171 Benign lipomatous neoplasm of skin and subcutaneous tissue of trunk: Secondary | ICD-10-CM | POA: Insufficient documentation

## 2014-07-25 DIAGNOSIS — M549 Dorsalgia, unspecified: Secondary | ICD-10-CM | POA: Insufficient documentation

## 2014-07-25 DIAGNOSIS — R197 Diarrhea, unspecified: Secondary | ICD-10-CM | POA: Diagnosis not present

## 2014-07-25 DIAGNOSIS — K5909 Other constipation: Secondary | ICD-10-CM | POA: Diagnosis not present

## 2014-07-25 DIAGNOSIS — K760 Fatty (change of) liver, not elsewhere classified: Secondary | ICD-10-CM | POA: Diagnosis not present

## 2014-07-25 DIAGNOSIS — Z7401 Bed confinement status: Secondary | ICD-10-CM | POA: Diagnosis not present

## 2014-07-25 DIAGNOSIS — R1012 Left upper quadrant pain: Secondary | ICD-10-CM | POA: Diagnosis not present

## 2014-07-25 DIAGNOSIS — R109 Unspecified abdominal pain: Secondary | ICD-10-CM

## 2014-07-25 DIAGNOSIS — L89151 Pressure ulcer of sacral region, stage 1: Secondary | ICD-10-CM | POA: Insufficient documentation

## 2014-07-25 DIAGNOSIS — K6289 Other specified diseases of anus and rectum: Secondary | ICD-10-CM | POA: Diagnosis present

## 2014-07-25 LAB — COMPREHENSIVE METABOLIC PANEL
ALT: 26 U/L (ref 0–53)
AST: 24 U/L (ref 0–37)
Albumin: 4.3 g/dL (ref 3.5–5.2)
Alkaline Phosphatase: 138 U/L — ABNORMAL HIGH (ref 39–117)
Anion gap: 11 (ref 5–15)
BUN: 15 mg/dL (ref 6–23)
CO2: 24 mmol/L (ref 19–32)
Calcium: 9.3 mg/dL (ref 8.4–10.5)
Chloride: 98 mmol/L (ref 96–112)
Creatinine, Ser: 0.79 mg/dL (ref 0.50–1.35)
GFR calc Af Amer: >90 mL/min (ref >90)
GFR calc non Af Amer: >90 mL/min (ref >90)
Glucose, Bld: 315 mg/dL — ABNORMAL HIGH (ref 70–99)
Potassium: 4.2 mmol/L (ref 3.5–5.1)
Sodium: 133 mmol/L — ABNORMAL LOW (ref 135–145)
Total Bilirubin: 0.7 mg/dL (ref 0.3–1.2)
Total Protein: 8.3 g/dL (ref 6.0–8.3)

## 2014-07-25 LAB — LIPASE, BLOOD: Lipase: 20 U/L (ref 11–59)

## 2014-07-25 LAB — URINALYSIS, ROUTINE W REFLEX MICROSCOPIC
Bilirubin Urine: NEGATIVE
Glucose, UA: >1000 mg/dL — AB
Ketones, ur: 15 mg/dL — AB
Nitrite: NEGATIVE
Protein, ur: NEGATIVE mg/dL
Specific Gravity, Urine: 1.025 (ref 1.005–1.030)
Urobilinogen, UA: 0.2 mg/dL (ref 0.0–1.0)
pH: 5.5 (ref 5.0–8.0)

## 2014-07-25 LAB — CBC WITH DIFFERENTIAL/PLATELET
Basophils Absolute: 0 10*3/uL (ref 0.0–0.1)
Basophils Relative: 0 % (ref 0–1)
Eosinophils Absolute: 0.1 10*3/uL (ref 0.0–0.7)
Eosinophils Relative: 1 % (ref 0–5)
HCT: 44 % (ref 39.0–52.0)
Hemoglobin: 15.4 g/dL (ref 13.0–17.0)
Lymphocytes Relative: 27 % (ref 12–46)
Lymphs Abs: 2.2 10*3/uL (ref 0.7–4.0)
MCH: 29.1 pg (ref 26.0–34.0)
MCHC: 35 g/dL (ref 30.0–36.0)
MCV: 83.2 fL (ref 78.0–100.0)
Monocytes Absolute: 0.7 10*3/uL (ref 0.1–1.0)
Monocytes Relative: 8 % (ref 3–12)
Neutro Abs: 5.4 10*3/uL (ref 1.7–7.7)
Neutrophils Relative %: 64 % (ref 43–77)
Platelets: 162 10*3/uL (ref 150–400)
RBC: 5.29 MIL/uL (ref 4.22–5.81)
RDW: 12.1 % (ref 11.5–15.5)
WBC: 8.4 10*3/uL (ref 4.0–10.5)

## 2014-07-25 LAB — URINE MICROSCOPIC-ADD ON

## 2014-07-25 MED ORDER — IOHEXOL 300 MG/ML  SOLN
100.0000 mL | Freq: Once | INTRAMUSCULAR | Status: AC | PRN
  Administered 2014-07-25: 100 mL via INTRAVENOUS

## 2014-07-25 MED ORDER — SODIUM CHLORIDE 0.9 % IJ SOLN
INTRAMUSCULAR | Status: AC
  Filled 2014-07-25: qty 1000

## 2014-07-25 MED ORDER — IOHEXOL 300 MG/ML  SOLN
50.0000 mL | Freq: Once | INTRAMUSCULAR | Status: AC | PRN
  Administered 2014-07-25: 50 mL via ORAL

## 2014-07-25 MED ORDER — PEG 3350/ELECTROLYTES 240 G PO SOLR: 240.0000 mL | ORAL | Status: DC

## 2014-07-25 NOTE — ED Provider Notes (Signed)
CSN: [id number]     Arrival date & time 07/25/14  1723 History   This chart was scribed for Delora Fuel, MD by Chester Holstein, ED Scribe. This patient was seen in room APA14/[facility]14 and the patient's care was started at 5:55 PM.    Chief Complaint  Patient presents with  . Rectal Pain    The history is provided by the patient. No language interpreter was used.    HPI Comments: Jeremy Parks is a 64 y.o. male brought in by ambulance, with PMHx of cerebral palsy and seizures who presents to the Emergency Department complaining of 4/10 abdominal pain with onset this morning. Pt denies nay known alleviating or aggravating factors. Pt notes associated rectal pain, nausea, diarrhea and back pain. Pt denies vomiting, chills, fever, diaphoresis, and constipation.  Past Medical History  Diagnosis Date  . Cerebral palsy   . Seizures    History reviewed. No pertinent past surgical history. History reviewed. No pertinent family history. History  Substance Use Topics  . Smoking status: Never Smoker   . Smokeless tobacco: Not on file  . Alcohol Use: No    Review of Systems  Constitutional: Negative for fever, chills and diaphoresis.  Gastrointestinal: Positive for nausea, abdominal pain, diarrhea and rectal pain. Negative for vomiting and constipation.  Musculoskeletal: Positive for back pain.  All other systems reviewed and are negative.    Allergies  Review of patient's allergies indicates no known allergies.  Home Medications   Prior to Admission medications   Medication Sig Start Date End Date Taking? Authorizing Provider  azithromycin (ZITHROMAX) 250 MG tablet Take one tab daily with food until all taken. 08/09/12   Lily Kocher, PA-C  carbamazepine (TEGRETOL) 200 MG tablet Take 200 mg by mouth 2 (two) times daily.    Historical Provider, MD  chlorhexidine (PERIDEX) 0.12 % solution Use as directed 15 mLs in the mouth or throat 2 (two) times daily. **Swish 15 mls for 30 seconds then  spit out. USE DAILY**    Historical Provider, MD  HYDROcodone-acetaminophen (NORCO/VICODIN) 5-325 MG per tablet Take 1 tablet by mouth every 4 (four) hours as needed for pain. 08/09/12   Lily Kocher, PA-C  ibuprofen (ADVIL,MOTRIN) 800 MG tablet Take 800 mg by mouth every 6 (six) hours as needed.    Historical Provider, MD  ondansetron (ZOFRAN ODT) 4 MG disintegrating tablet Take 1 tablet (4 mg total) by mouth every 8 (eight) hours as needed. 02/16/14   Fredia Sorrow, MD   BP 128/81 mmHg  Pulse 96  Temp(Src) 98.2 F (36.8 C) (Oral)  Resp 18  Ht 6' (1.829 m)  Wt 175 lb (79.379 kg)  BMI 23.73 kg/m2  SpO2 100% Physical Exam  Constitutional: He is oriented to person, place, and time. He appears well-developed and well-nourished.  HENT:  Head: Normocephalic.  Eyes: Conjunctivae are normal. Pupils are equal, round, and reactive to light.  Neck: Normal range of motion. Neck supple. No JVD present.  Cardiovascular: Normal rate and regular rhythm.   No murmur heard. Pulmonary/Chest: Effort normal and breath sounds normal. No respiratory distress. He has no wheezes. He has no rales.  Abdominal: Soft. Bowel sounds are normal. He exhibits no distension. There is tenderness (mild) in the left lower quadrant. There is no rebound and no guarding.  Lipoma present LUQ  Genitourinary: Rectal exam shows no internal hemorrhoid and no fissure.  grade 1 left sacral decubitis  Musculoskeletal: Normal range of motion. He exhibits no edema.  Braces are  present on both ankles.  Lymphadenopathy:    He has no cervical adenopathy.  Neurological: He is alert and oriented to person, place, and time. No cranial nerve deficit.  Skin: Skin is warm and dry. No rash noted.  Psychiatric: He has a normal mood and affect. His behavior is normal.  Nursing note and vitals reviewed.   ED Course  Procedures (including critical care time) DIAGNOSTIC STUDIES: Oxygen Saturation is 100% on room air, normal by my  interpretation.    COORDINATION OF CARE: 6:08 PM Discussed treatment plan with patient at beside, the patient agrees with the plan and has no further questions at this time.   Labs Review Results for orders placed or performed during the hospital encounter of 07/25/14  Comprehensive metabolic panel  Result Value Ref Range   Sodium 133 (L) 135 - 145 mmol/L   Potassium 4.2 3.5 - 5.1 mmol/L   Chloride 98 96 - 112 mmol/L   CO2 24 19 - 32 mmol/L   Glucose, Bld 315 (H) 70 - 99 mg/dL   BUN 15 6 - 23 mg/dL   Creatinine, Ser 0.79 0.50 - 1.35 mg/dL   Calcium 9.3 8.4 - 10.5 mg/dL   Total Protein 8.3 6.0 - 8.3 g/dL   Albumin 4.3 3.5 - 5.2 g/dL   AST 24 0 - 37 U/L   ALT 26 0 - 53 U/L   Alkaline Phosphatase 138 (H) 39 - 117 U/L   Total Bilirubin 0.7 0.3 - 1.2 mg/dL   GFR calc non Af Amer >90 >90 mL/min   GFR calc Af Amer >90 >90 mL/min   Anion gap 11 5 - 15  Lipase, blood  Result Value Ref Range   Lipase 20 11 - 59 U/L  CBC with Differential  Result Value Ref Range   WBC 8.4 4.0 - 10.5 K/uL   RBC 5.29 4.22 - 5.81 MIL/uL   Hemoglobin 15.4 13.0 - 17.0 g/dL   HCT 44.0 39.0 - 52.0 %   MCV 83.2 78.0 - 100.0 fL   MCH 29.1 26.0 - 34.0 pg   MCHC 35.0 30.0 - 36.0 g/dL   RDW 12.1 11.5 - 15.5 %   Platelets 162 150 - 400 K/uL   Neutrophils Relative % 64 43 - 77 %   Neutro Abs 5.4 1.7 - 7.7 K/uL   Lymphocytes Relative 27 12 - 46 %   Lymphs Abs 2.2 0.7 - 4.0 K/uL   Monocytes Relative 8 3 - 12 %   Monocytes Absolute 0.7 0.1 - 1.0 K/uL   Eosinophils Relative 1 0 - 5 %   Eosinophils Absolute 0.1 0.0 - 0.7 K/uL   Basophils Relative 0 0 - 1 %   Basophils Absolute 0.0 0.0 - 0.1 K/uL  Urinalysis, Routine w reflex microscopic  Result Value Ref Range   Color, Urine YELLOW YELLOW   APPearance CLEAR CLEAR   Specific Gravity, Urine 1.025 1.005 - 1.030   pH 5.5 5.0 - 8.0   Glucose, UA >1000 (A) NEGATIVE mg/dL   Hgb urine dipstick TRACE (A) NEGATIVE   Bilirubin Urine NEGATIVE NEGATIVE   Ketones, ur  15 (A) NEGATIVE mg/dL   Protein, ur NEGATIVE NEGATIVE mg/dL   Urobilinogen, UA 0.2 0.0 - 1.0 mg/dL   Nitrite NEGATIVE NEGATIVE   Leukocytes, UA TRACE (A) NEGATIVE  Urine microscopic-add on  Result Value Ref Range   Squamous Epithelial / LPF RARE RARE   WBC, UA 3-6 <3 WBC/hpf   RBC / HPF 0-2 <3  RBC/hpf   Bacteria, UA FEW (A) RARE   Urine-Other YEAST    Imaging Review Ct Abdomen Pelvis W Contrast  07/25/2014   CLINICAL DATA:  Left lower quadrant pain, rectal pain  EXAM: CT ABDOMEN AND PELVIS WITH CONTRAST  TECHNIQUE: Multidetector CT imaging of the abdomen and pelvis was performed using the standard protocol following bolus administration of intravenous contrast.  CONTRAST:  67mL OMNIPAQUE IOHEXOL 300 MG/ML SOLN, 130mL OMNIPAQUE IOHEXOL 300 MG/ML SOLN  COMPARISON:  08/08/2012  FINDINGS: Sagittal images of the spine shows mild degenerative changes lumbar spine.  Lung bases are unremarkable. There are artifacts from patient's right hand overlying the mid upper abdomen. Mild hepatic fatty infiltration. No focal hepatic mass. The spleen, pancreas and adrenal glands are unremarkable.  Kidneys are symmetrical in size and enhancement. No hydronephrosis or hydroureter. There is a probable lipoma in left anterior abdominal wall measures 9 x 3.6 cm. Abdominal aorta is unremarkable. No aortic aneurysm.  Kidneys are symmetrical in size and enhancement. No hydronephrosis or hydroureter. Abundant stool noted in right colon and transverse colon. There is no small bowel obstruction. No ascites or free air. No adenopathy. There is moderate stool in descending colon. There is significant redundant sigmoid colon which is looping in left upper abdomen. Moderate colonic gas is noted within sigmoid colon. There is sign narrowing of the sigmoid colon in left upper abdomen see axial image 35 without evidence of definite obstruction or mass at this level. There is some gas and moderate stool is noted in distal sigmoid colon.  Moderate stool is noted within rectum. The rectum measures 5.4 cm in diameter suspicious for mild fecal impaction.  The urinary bladder is unremarkable. Prostate gland is unremarkable. There is a small right inguinal hernia containing fat measures 1.9 cm without evidence of acute complication. Mild enlarged lymph nodes noted in left inguinal region the largest measures 1.4 x 1.5 cm probable reactive.  Delayed renal images shows bilateral renal symmetrical excretion.  IMPRESSION: 1. Mild hepatic fatty infiltration. 2. No pericecal inflammation. Abundant stool noted in right colon and transverse colon. Moderate stool noted in descending colon. Significant redundant sigmoid colon which is looping in left upper abdomen. There is mild narrowing of the lumen of the sigmoid colon in left abdomen see axial image 35 and coronal image 34. There is no definite evidence of colonic obstruction or mass at this level. There is some gas within colon beyond this point. Moderate stool is noted within rectum which measures 5.4 cm in diameter suspicious for mild fecal impaction. 3. No small bowel obstruction. 4. Probable lipoma in left anterior abdominal wall measures 8.9 x 3.6 cm. 5. Mild enlarged lymph nodes in left inguinal region. This may be reactive. Clinical correlation is necessary. 6. No hydronephrosis or hydroureter.   Electronically Signed   By: Lahoma Crocker M.D.   On: 07/25/2014 21:21   Images viewed by me.   MDM   Final diagnoses:  Abdominal pain, unspecified abdominal location  Constipation - functional    Abdominal pain of uncertain cause. Lipoma in left upper quadrant is not related to his pain and is an incidental finding. Ability to get history is somewhat limited, so he will be sent for CT scan.  CT is significant for significant fecal stasis and fecal retention which appears to be the cause of his pain. CT also confirms presence of lipoma and left upper quadrant. He is discharged with prescription for  polyethylene glycol solution.   I personally performed the  services described in this documentation, which was scribed in my presence. The recorded information has been reviewed and is accurate.       Delora Fuel, MD 50/27/74 [id number]

## 2014-07-25 NOTE — ED Notes (Signed)
Spoke with Charleston Ropes at [facility], report given to her and all questions answered. [facility] to transport.

## 2014-07-25 NOTE — ED Notes (Signed)
EMS called out for rectal pain.

## 2014-07-25 NOTE — ED Notes (Signed)
[facility] called for patient transport.

## 2014-07-25 NOTE — Discharge Instructions (Signed)
Constipation Constipation is when a person has fewer than three bowel movements a week, has difficulty having a bowel movement, or has stools that are dry, hard, or larger than normal. As people grow older, constipation is more common. If you try to fix constipation with medicines that make you have a bowel movement (laxatives), the problem may get worse. Long-term laxative use may cause the muscles of the colon to become weak. A low-fiber diet, not taking in enough fluids, and taking certain medicines may make constipation worse.  CAUSES   Certain medicines, such as antidepressants, pain medicine, iron supplements, antacids, and water pills.   Certain diseases, such as diabetes, irritable bowel syndrome (IBS), thyroid disease, or depression.   Not drinking enough water.   Not eating enough fiber-rich foods.   Stress or travel.   Lack of physical activity or exercise.   Ignoring the urge to have a bowel movement.   Using laxatives too much.  SIGNS AND SYMPTOMS   Having fewer than three bowel movements a week.   Straining to have a bowel movement.   Having stools that are hard, dry, or larger than normal.   Feeling full or bloated.   Pain in the lower abdomen.   Not feeling relief after having a bowel movement.  DIAGNOSIS  Your health care provider will take a medical history and perform a physical exam. Further testing may be done for severe constipation. Some tests may include:  A barium enema X-ray to examine your rectum, colon, and, sometimes, your small intestine.   A sigmoidoscopy to examine your lower colon.   A colonoscopy to examine your entire colon. TREATMENT  Treatment will depend on the severity of your constipation and what is causing it. Some dietary treatments include drinking more fluids and eating more fiber-rich foods. Lifestyle treatments may include regular exercise. If these diet and lifestyle recommendations do not help, your health care  provider may recommend taking over-the-counter laxative medicines to help you have bowel movements. Prescription medicines may be prescribed if over-the-counter medicines do not work.  HOME CARE INSTRUCTIONS   Eat foods that have a lot of fiber, such as fruits, vegetables, whole grains, and beans.  Limit foods high in fat and processed sugars, such as french fries, hamburgers, cookies, candies, and soda.   A fiber supplement may be added to your diet if you cannot get enough fiber from foods.   Drink enough fluids to keep your urine clear or pale yellow.   Exercise regularly or as directed by your health care provider.   Go to the restroom when you have the urge to go. Do not hold it.   Only take over-the-counter or prescription medicines as directed by your health care provider. Do not take other medicines for constipation without talking to your health care provider first.  [place] IF:   You have bright red blood in your stool.   Your constipation lasts for more than 4 days or gets worse.   You have abdominal or rectal pain.   You have thin, pencil-like stools.   You have unexplained weight loss. MAKE SURE YOU:   Understand these instructions.  Will watch your condition.  Will get help right away if you are not doing well or get worse. Document Released: 01/17/2004 Document Revised: 04/25/2013 Document Reviewed: 01/30/2013 [facility] Patient Information 2015 [employer], [place]. This information is not intended to replace advice given to you by your health care provider. Make sure you discuss any questions  you have with your health care provider.   Abdominal Pain Many things can cause abdominal pain. Usually, abdominal pain is not caused by a disease and will improve without treatment. It can often be observed and treated at home. Your health care provider will do a physical exam and possibly order blood tests and X-rays to help determine the  seriousness of your pain. However, in many cases, more time must pass before a clear cause of the pain can be found. Before that point, your health care provider may not know if you need more testing or further treatment. HOME CARE INSTRUCTIONS  Monitor your abdominal pain for any changes. The following actions may help to alleviate any discomfort you are experiencing:  Only take over-the-counter or prescription medicines as directed by your health care provider.  Do not take laxatives unless directed to do so by your health care provider.  Try a clear liquid diet (broth, tea, or water) as directed by your health care provider. Slowly move to a bland diet as tolerated. SEEK MEDICAL CARE IF:  You have unexplained abdominal pain.  You have abdominal pain associated with nausea or diarrhea.  You have pain when you urinate or have a bowel movement.  You experience abdominal pain that wakes you in the night.  You have abdominal pain that is worsened or improved by eating food.  You have abdominal pain that is worsened with eating fatty foods.  You have a fever. SEEK IMMEDIATE MEDICAL CARE IF:   Your pain does not go away within 2 hours.  You keep throwing up (vomiting).  Your pain is felt only in portions of the abdomen, such as the right side or the left lower portion of the abdomen.  You pass bloody or black tarry stools. MAKE SURE YOU:  Understand these instructions.   Will watch your condition.   Will get help right away if you are not doing well or get worse.  Document Released: 01/28/2005 Document Revised: 04/25/2013 Document Reviewed: 12/28/2012 [facility] Patient Information 2015 [place], [place]. This information is not intended to replace advice given to you by your health care provider. Make sure you discuss any questions you have with your health care provider.  Polyethylene glycol; Electrolytes; Bisacodyl bowel prep kit What is this medicine? POLYETHYLENE GLYCOL;  ELECTROLYTES; BISACODYL (pol ee ETH i leen GLYE col; i LEK truh lahyts; bis a KOE dill) bowel prep kit is a combination laxative. It is used to clean out the bowel before a colonoscopy. This medicine may be used for other purposes; ask your health care provider or pharmacist if you have questions. COMMON BRAND NAME(S): GaviLyte-H, HalfLytely and Bisacodyl Tablet Bowel Prep Kit, PEG-Prep and Bisacodyl What should I tell my health care provider before I take this medicine? They need to know if you have any of these conditions: - any chronic disease of the intestine, stomach, or throat - kidney disease - stomach pain or blockage - an unusual or allergic reaction to polyethylene glycol, bisacodyl, other medicines, dyes, or preservatives - pregnant or trying to get pregnant - breast-feeding How should I use this medicine? Take this medicine by mouth. Swallow the tablets whole with a glass of water. Do not crush or chew. Do not attempt to swallow the dry powder before mixing it with water. To take this medicine, you must prepare a solution as indicated on the package label. Other than the flavor packs provided, do not add any additional ingredients or flavors to the solution. You may  chill the solution before using it to improve its taste. Shake well before each dose. Your doctor will tell you at what time to start this medicine. Take exactly as directed. You will have the first bowel movement about 1 to 6 hours after you take the tablets. Once you start taking the solution, you will have frequent watery bowel movements. You will need to follow a special diet before your procedure. Talk to your doctor or healthcare professional. Talk to your pediatrician regarding the use of this medicine in children. Special care may be needed. Overdosage: If you think you've taken too much of this medicine contact a poison control center or emergency room at once. Overdosage: If you think you have taken too much of this  medicine contact a poison control center or emergency room at once. NOTE: This medicine is only for you. Do not share this medicine with others. What if I miss a dose? Talk to your doctor if you are not able to complete the bowel preparation as advised. What may interact with this medicine? - antacids - diuretics - medicines for blood pressure like enalapril, lisinopril, quinapril, and ramipril - medicines for stomach problems like famotidine, nizatidine, ranitidine, esomeprazole, omeprazole, pantoprazole, and rabeprazole This list may not describe all possible interactions. Give your health care provider a list of all the medicines, herbs, non-prescription drugs, or dietary supplements you use. Also tell them if you smoke, drink alcohol, or use illegal drugs. Some items may interact with your medicine. What should I watch for while using this medicine? Do not take any other medicine by mouth starting one hour before you take this medicine. Talk to your doctor about when to take your other medicines. Do not take this medicine within 1 hour of taking antacids or eating dairy products like milk or yogurt. These items can destroy the protective coating on the tablets and increase stomach upset and cramps. Do not inhale dust from the solution powder. This can make breathing difficult or may cause sneezing or an allergic-type reaction. Bloating may occur before the first bowel movement. If your discomfort continues while you are drinking the solution, stop drinking the solution temporarily or drink each portion with longer spaces in between until your symptoms disappear. What side effects may I notice from receiving this medicine? Side effects that you should report to your doctor or health care professional as soon as possible: - allergic reactions like skin rash, itching or hives, swelling of the face, lips, or tongue - breathing problems - chest tightness - seizures - vomiting Side effects that  usually do not require medical attention (Report these to your doctor or health care professional if they continue or are bothersome.): - anal irritation - bloating, pain, or distension of the stomach - headache - hunger, thirst - nausea - stomach gas This list may not describe all possible side effects. Call your doctor for medical advice about side effects. You may report side effects to FDA at 1-800-FDA-1088. Where should I keep my medicine? Keep out of the reach of children. Store at room temperature between 15 and 30 degrees C (59 and 86 degrees F). Protect from moisture. Once the solution has been prepared, store it in the refrigerator to improve its taste. Do not freeze. Throw away any unused solution 48 hours after mixing. NOTE: This sheet is a summary. It may not cover all possible information. If you have questions about this medicine, talk to your doctor, pharmacist, or health care provider.  2015,  [employer] Standard. (2007-12-30 15:59:28)

## 2014-07-26 ENCOUNTER — Encounter (HOSPITAL_COMMUNITY): Payer: Self-pay | Admitting: *Deleted

## 2014-07-26 ENCOUNTER — Emergency Department (HOSPITAL_COMMUNITY)
Admission: EM | Admit: 2014-07-26 | Discharge: 2014-07-26 | Disposition: A | Discharge status: Home / Self Care | Payer: Medicare Other | Attending: Emergency Medicine | Admitting: Emergency Medicine

## 2014-07-26 DIAGNOSIS — L0231 Cutaneous abscess of buttock: Secondary | ICD-10-CM | POA: Diagnosis not present

## 2014-07-26 DIAGNOSIS — G40909 Epilepsy, unspecified, not intractable, without status epilepticus: Secondary | ICD-10-CM | POA: Diagnosis not present

## 2014-07-26 DIAGNOSIS — R109 Unspecified abdominal pain: Secondary | ICD-10-CM | POA: Diagnosis present

## 2014-07-26 DIAGNOSIS — D171 Benign lipomatous neoplasm of skin and subcutaneous tissue of trunk: Secondary | ICD-10-CM | POA: Diagnosis not present

## 2014-07-26 DIAGNOSIS — D179 Benign lipomatous neoplasm, unspecified: Secondary | ICD-10-CM

## 2014-07-26 DIAGNOSIS — K5900 Constipation, unspecified: Secondary | ICD-10-CM | POA: Diagnosis not present

## 2014-07-26 DIAGNOSIS — Z743 Need for continuous supervision: Secondary | ICD-10-CM | POA: Diagnosis not present

## 2014-07-26 DIAGNOSIS — R40241 Glasgow coma scale score 13-15: Secondary | ICD-10-CM | POA: Diagnosis not present

## 2014-07-26 DIAGNOSIS — Z79899 Other long term (current) drug therapy: Secondary | ICD-10-CM | POA: Insufficient documentation

## 2014-07-26 DIAGNOSIS — R279 Unspecified lack of coordination: Secondary | ICD-10-CM | POA: Diagnosis not present

## 2014-07-26 HISTORY — DX: Benign lipomatous neoplasm of skin and subcutaneous tissue of trunk: D17.1

## 2014-07-26 MED ORDER — SULFAMETHOXAZOLE-TRIMETHOPRIM 800-160 MG PO TABS
1.0000 | ORAL_TABLET | Freq: Two times a day (BID) | ORAL | Status: DC
Start: 2014-07-26 — End: 2016-06-03

## 2014-07-26 MED ORDER — SULFAMETHOXAZOLE-TRIMETHOPRIM 800-160 MG PO TABS
1.0000 | ORAL_TABLET | Freq: Once | ORAL | Status: AC
  Administered 2014-07-26: 1 via ORAL
  Filled 2014-07-26: qty 1

## 2014-07-26 NOTE — ED Provider Notes (Signed)
CSN: [id number]     Arrival date & time 07/26/14  1346 History   First MD Initiated Contact with Patient 07/26/14 1412     Chief Complaint  Patient presents with  . Abdominal Pain  . Mass    lt side off abdomen     HPI  Impression presents from his care facility with concern "staff suspects he has a hernia".  He should have an evaluation yesterday including CT scan of the abdomen exam showing abdominal: Lipoma and no sign of abdominal wall hernia. His diagnosed with constipation and prescribed GoLYTELY. Situation has not changed. No nausea vomiting diarrhea fevers. Patient is able to some tell me that he has had this area "a long time". He denies abdominal pain. He has no abdominal tenderness.  Past Medical History  Diagnosis Date  . Cerebral palsy   . Seizures   . Lipoma of abdominal wall    History reviewed. No pertinent past surgical history. History reviewed. No pertinent family history. History  Substance Use Topics  . Smoking status: Never Smoker   . Smokeless tobacco: Not on file  . Alcohol Use: No    Review of Systems  Constitutional: Negative for fever, chills, diaphoresis, appetite change and fatigue.  HENT: Negative for mouth sores, sore throat and trouble swallowing.   Eyes: Negative for visual disturbance.  Respiratory: Negative for cough, chest tightness, shortness of breath and wheezing.   Cardiovascular: Negative for chest pain.  Gastrointestinal: Negative for nausea, vomiting, abdominal pain, diarrhea and abdominal distention.  Endocrine: Negative for polydipsia, polyphagia and polyuria.  Genitourinary: Negative for dysuria, frequency and hematuria.  Musculoskeletal: Negative for gait problem.  Skin: Negative for color change, pallor and rash.  Neurological: Negative for dizziness, syncope, light-headedness and headaches.  Hematological: Does not bruise/bleed easily.  Psychiatric/Behavioral: Negative for behavioral problems and confusion.       Allergies  Review of patient's allergies indicates no known allergies.  Home Medications   Prior to Admission medications   Medication Sig Start Date End Date Taking? Authorizing Provider  acetaminophen (TYLENOL) 500 MG tablet Take 500 mg by mouth every 6 (six) hours as needed (pain).    Historical Provider, MD  azithromycin (ZITHROMAX) 250 MG tablet Take one tab daily with food until all taken. Patient not taking: Reported on 07/25/2014 08/09/12   Lily Kocher, PA-C  carbamazepine (TEGRETOL) 200 MG tablet Take 200 mg by mouth 2 (two) times daily.    Historical Provider, MD  chlorhexidine (PERIDEX) 0.12 % solution Use as directed 15 mLs in the mouth or throat daily. **Swish 15 mls for 30 seconds then spit out. USE DAILY**    Historical Provider, MD  HYDROcodone-acetaminophen (NORCO/VICODIN) 5-325 MG per tablet Take 1 tablet by mouth every 4 (four) hours as needed for pain. Patient not taking: Reported on 07/25/2014 08/09/12   Lily Kocher, PA-C  ondansetron (ZOFRAN ODT) 4 MG disintegrating tablet Take 1 tablet (4 mg total) by mouth every 8 (eight) hours as needed. 02/16/14   Fredia Sorrow, MD  PEG 3350-KCl-NaBcb-NaCl-NaSulf (PEG 3350/ELECTROLYTES) 240 G SOLR Take 240 mLs by mouth every 15 (fifteen) minutes. 1/75/10   Delora Fuel, MD   BP 258/52 mmHg  Pulse 89  Temp(Src) 98.3 F (36.8 C) (Oral)  Resp 17  SpO2 99% Physical Exam  Constitutional: He is oriented to person, place, and time. He appears well-developed and well-nourished. No distress.  HENT:  Head: Normocephalic.  Eyes: Conjunctivae are normal. Pupils are equal, round, and reactive to light. No scleral  icterus.  Neck: Normal range of motion. Neck supple. No thyromegaly present.  Cardiovascular: Normal rate and regular rhythm.  Exam reveals no gallop and no friction rub.   No murmur heard. Pulmonary/Chest: Effort normal and breath sounds normal. No respiratory distress. He has no wheezes. He has no rales.  Abdominal:  Soft. Bowel sounds are normal. He exhibits no distension. There is no tenderness. There is no rebound.    Musculoskeletal: Normal range of motion.  Neurological: He is alert and oriented to person, place, and time.  Skin: Skin is warm and dry. No rash noted.  Psychiatric: He has a normal mood and affect. His behavior is normal.    ED Course  Procedures (including critical care time) Labs Review Labs Reviewed - No data to display  Imaging Review Ct Abdomen Pelvis W Contrast  07/25/2014   CLINICAL DATA:  Left lower quadrant pain, rectal pain  EXAM: CT ABDOMEN AND PELVIS WITH CONTRAST  TECHNIQUE: Multidetector CT imaging of the abdomen and pelvis was performed using the standard protocol following bolus administration of intravenous contrast.  CONTRAST:  55mL OMNIPAQUE IOHEXOL 300 MG/ML SOLN, 166mL OMNIPAQUE IOHEXOL 300 MG/ML SOLN  COMPARISON:  08/08/2012  FINDINGS: Sagittal images of the spine shows mild degenerative changes lumbar spine.  Lung bases are unremarkable. There are artifacts from patient's right hand overlying the mid upper abdomen. Mild hepatic fatty infiltration. No focal hepatic mass. The spleen, pancreas and adrenal glands are unremarkable.  Kidneys are symmetrical in size and enhancement. No hydronephrosis or hydroureter. There is a probable lipoma in left anterior abdominal wall measures 9 x 3.6 cm. Abdominal aorta is unremarkable. No aortic aneurysm.  Kidneys are symmetrical in size and enhancement. No hydronephrosis or hydroureter. Abundant stool noted in right colon and transverse colon. There is no small bowel obstruction. No ascites or free air. No adenopathy. There is moderate stool in descending colon. There is significant redundant sigmoid colon which is looping in left upper abdomen. Moderate colonic gas is noted within sigmoid colon. There is sign narrowing of the sigmoid colon in left upper abdomen see axial image 35 without evidence of definite obstruction or mass at this  level. There is some gas and moderate stool is noted in distal sigmoid colon. Moderate stool is noted within rectum. The rectum measures 5.4 cm in diameter suspicious for mild fecal impaction.  The urinary bladder is unremarkable. Prostate gland is unremarkable. There is a small right inguinal hernia containing fat measures 1.9 cm without evidence of acute complication. Mild enlarged lymph nodes noted in left inguinal region the largest measures 1.4 x 1.5 cm probable reactive.  Delayed renal images shows bilateral renal symmetrical excretion.  IMPRESSION: 1. Mild hepatic fatty infiltration. 2. No pericecal inflammation. Abundant stool noted in right colon and transverse colon. Moderate stool noted in descending colon. Significant redundant sigmoid colon which is looping in left upper abdomen. There is mild narrowing of the lumen of the sigmoid colon in left abdomen see axial image 35 and coronal image 34. There is no definite evidence of colonic obstruction or mass at this level. There is some gas within colon beyond this point. Moderate stool is noted within rectum which measures 5.4 cm in diameter suspicious for mild fecal impaction. 3. No small bowel obstruction. 4. Probable lipoma in left anterior abdominal wall measures 8.9 x 3.6 cm. 5. Mild enlarged lymph nodes in left inguinal region. This may be reactive. Clinical correlation is necessary. 6. No hydronephrosis or hydroureter.   Electronically Signed  By: Lahoma Crocker M.D.   On: 07/25/2014 21:21     EKG Interpretation None      MDM   Final diagnoses:  Lipoma  Constipation, unspecified constipation type    Reassuring exam. Stable lipoma. Will try to reitterate to his care facility that this is a lipoma, not a hernia. Recommended they follow through with the physician's recommendation of giving GoLYTELY for his constipation.    Tanna Furry, MD 07/26/14 [phone]

## 2014-07-26 NOTE — ED Notes (Addendum)
Pt brought in from [facility] facility for abdominal mass to the lt of belly button with pain. Seen here for same thing, DX with constipation and abdominal lipoma. Pt has HX of cerebral palsy. Pt was DC with instructions for facility to give an enema, pt sent back here for staff suspecting hernia.

## 2014-07-26 NOTE — ED Provider Notes (Signed)
Asked by RN to examine skin on left buttocks.  Patient has a draining boil approximately 2 cm in diameter. Moderate amount of pus expressed. Rx Septra DS twice a day for 10 days.  Nat Christen, MD 07/26/14 1950

## 2014-07-26 NOTE — ED Notes (Signed)
Discharge instructions reviewed with pt, questions answered. Pt verbalized understanding.  

## 2014-07-26 NOTE — ED Notes (Signed)
Pt still waiting for EMS to transport him back to [facility]. No requests at this time.

## 2014-07-26 NOTE — Discharge Instructions (Signed)
Jeremy Parks does not have an abdominal wall hernia. His CT scan on March 23 clearly demonstrates a lipoma in his abdominal wall. Lipoma is a benign fatty growth, is not painful, and is not dangerous. He IS constipated and needs to have the GoLYTELY prescription given by Dr. Roxanne Mins yesterday in the emergency room.  Additonally, you have a draining boil on the buttocks. Antibiotic for same.  Can also take shower.    Lipoma A lipoma is a noncancerous (benign) tumor composed of fat cells. They are usually found under the skin (subcutaneous). A lipoma may occur in any tissue of the body that contains fat. Common areas for lipomas to appear include the back, shoulders, buttocks, and thighs. Lipomas are a very common soft tissue growth. They are soft and grow slowly. Most problems caused by a lipoma depend on where it is growing. DIAGNOSIS  A lipoma can be diagnosed with a physical exam. These tumors rarely become cancerous, but radiographic studies can help determine this for certain. Studies used may include:  Computerized X-ray scans (CT or CAT scan).  Computerized magnetic scans (MRI). TREATMENT  Small lipomas that are not causing problems may be watched. If a lipoma continues to enlarge or causes problems, removal is often the best treatment. Lipomas can also be removed to improve appearance. Surgery is done to remove the fatty cells and the surrounding capsule. Most often, this is done with medicine that numbs the area (local anesthetic). The removed tissue is examined under a microscope to make sure it is not cancerous. Keep all follow-up appointments with your caregiver. SEEK MEDICAL CARE IF:   The lipoma becomes larger or hard.  The lipoma becomes painful, red, or increasingly swollen. These could be signs of infection or a more serious condition. Document Released: 04/10/2002 Document Revised: 07/13/2011 Document Reviewed: 09/20/2009 [facility] Patient Information 2015 [place], [place]. This  information is not intended to replace advice given to you by your health care provider. Make sure you discuss any questions you have with your health care provider.  Constipation Constipation is when a person:  Poops (has a bowel movement) less than 3 times a week.  Has a hard time pooping.  Has poop that is dry, hard, or bigger than normal. HOME CARE   Eat foods with a lot of fiber in them. This includes fruits, vegetables, beans, and whole grains such as brown rice.  Avoid fatty foods and foods with a lot of sugar. This includes french fries, hamburgers, cookies, candy, and soda.  If you are not getting enough fiber from food, take products with added fiber in them (supplements).  Drink enough fluid to keep your pee (urine) clear or pale yellow.  Exercise on a regular basis, or as told by your doctor.  Go to the restroom when you feel like you need to poop. Do not hold it.  Only take medicine as told by your doctor. Do not take medicines that help you poop (laxatives) without talking to your doctor first. GET HELP RIGHT AWAY IF:   You have bright red blood in your poop (stool).  Your constipation lasts more than 4 days or gets worse.  You have belly (abdominal) or butt (rectal) pain.  You have thin poop (as thin as a pencil).  You lose weight, and it cannot be explained. MAKE SURE YOU:   Understand these instructions.  Will watch your condition.  Will get help right away if you are not doing well or get worse. Document Released: 10/07/2007 Document  Revised: 04/25/2013 Document Reviewed: 01/30/2013 [facility] Patient Information 2015 [place], [place]. This information is not intended to replace advice given to you by your health care provider. Make sure you discuss any questions you have with your health care provider.

## 2014-07-31 DIAGNOSIS — G809 Cerebral palsy, unspecified: Secondary | ICD-10-CM | POA: Diagnosis not present

## 2014-07-31 DIAGNOSIS — L0231 Cutaneous abscess of buttock: Secondary | ICD-10-CM | POA: Diagnosis not present

## 2014-08-03 DIAGNOSIS — R569 Unspecified convulsions: Secondary | ICD-10-CM | POA: Diagnosis not present

## 2014-08-03 DIAGNOSIS — G809 Cerebral palsy, unspecified: Secondary | ICD-10-CM | POA: Diagnosis not present

## 2014-08-03 DIAGNOSIS — L0231 Cutaneous abscess of buttock: Secondary | ICD-10-CM | POA: Diagnosis not present

## 2014-08-04 DIAGNOSIS — L0231 Cutaneous abscess of buttock: Secondary | ICD-10-CM | POA: Diagnosis not present

## 2014-08-04 DIAGNOSIS — R569 Unspecified convulsions: Secondary | ICD-10-CM | POA: Diagnosis not present

## 2014-08-04 DIAGNOSIS — G809 Cerebral palsy, unspecified: Secondary | ICD-10-CM | POA: Diagnosis not present

## 2014-08-06 DIAGNOSIS — G809 Cerebral palsy, unspecified: Secondary | ICD-10-CM | POA: Diagnosis not present

## 2014-08-06 DIAGNOSIS — R569 Unspecified convulsions: Secondary | ICD-10-CM | POA: Diagnosis not present

## 2014-08-06 DIAGNOSIS — L0231 Cutaneous abscess of buttock: Secondary | ICD-10-CM | POA: Diagnosis not present

## 2014-08-07 DIAGNOSIS — R569 Unspecified convulsions: Secondary | ICD-10-CM | POA: Diagnosis not present

## 2014-08-07 DIAGNOSIS — G809 Cerebral palsy, unspecified: Secondary | ICD-10-CM | POA: Diagnosis not present

## 2014-08-07 DIAGNOSIS — L0231 Cutaneous abscess of buttock: Secondary | ICD-10-CM | POA: Diagnosis not present

## 2014-08-08 DIAGNOSIS — H25813 Combined forms of age-related cataract, bilateral: Secondary | ICD-10-CM | POA: Diagnosis not present

## 2014-08-10 DIAGNOSIS — G809 Cerebral palsy, unspecified: Secondary | ICD-10-CM | POA: Diagnosis not present

## 2014-08-10 DIAGNOSIS — R569 Unspecified convulsions: Secondary | ICD-10-CM | POA: Diagnosis not present

## 2014-08-10 DIAGNOSIS — L0231 Cutaneous abscess of buttock: Secondary | ICD-10-CM | POA: Diagnosis not present

## 2014-08-14 DIAGNOSIS — G809 Cerebral palsy, unspecified: Secondary | ICD-10-CM | POA: Diagnosis not present

## 2014-08-14 DIAGNOSIS — L0231 Cutaneous abscess of buttock: Secondary | ICD-10-CM | POA: Diagnosis not present

## 2014-08-14 DIAGNOSIS — R569 Unspecified convulsions: Secondary | ICD-10-CM | POA: Diagnosis not present

## 2014-08-21 DIAGNOSIS — R569 Unspecified convulsions: Secondary | ICD-10-CM | POA: Diagnosis not present

## 2014-08-21 DIAGNOSIS — L0231 Cutaneous abscess of buttock: Secondary | ICD-10-CM | POA: Diagnosis not present

## 2014-08-21 DIAGNOSIS — G809 Cerebral palsy, unspecified: Secondary | ICD-10-CM | POA: Diagnosis not present

## 2014-08-27 DIAGNOSIS — M79674 Pain in right toe(s): Secondary | ICD-10-CM | POA: Diagnosis not present

## 2014-08-27 DIAGNOSIS — B351 Tinea unguium: Secondary | ICD-10-CM | POA: Diagnosis not present

## 2014-08-27 DIAGNOSIS — M79675 Pain in left toe(s): Secondary | ICD-10-CM | POA: Diagnosis not present

## 2014-08-29 DIAGNOSIS — R569 Unspecified convulsions: Secondary | ICD-10-CM | POA: Diagnosis not present

## 2014-08-29 DIAGNOSIS — G809 Cerebral palsy, unspecified: Secondary | ICD-10-CM | POA: Diagnosis not present

## 2014-08-29 DIAGNOSIS — L0231 Cutaneous abscess of buttock: Secondary | ICD-10-CM | POA: Diagnosis not present

## 2014-09-04 DIAGNOSIS — L02416 Cutaneous abscess of left lower limb: Secondary | ICD-10-CM | POA: Diagnosis not present

## 2014-09-04 DIAGNOSIS — G809 Cerebral palsy, unspecified: Secondary | ICD-10-CM | POA: Diagnosis not present

## 2014-09-04 DIAGNOSIS — R569 Unspecified convulsions: Secondary | ICD-10-CM | POA: Diagnosis not present

## 2014-09-08 DIAGNOSIS — R569 Unspecified convulsions: Secondary | ICD-10-CM | POA: Diagnosis not present

## 2014-09-08 DIAGNOSIS — G809 Cerebral palsy, unspecified: Secondary | ICD-10-CM | POA: Diagnosis not present

## 2014-09-08 DIAGNOSIS — L0231 Cutaneous abscess of buttock: Secondary | ICD-10-CM | POA: Diagnosis not present

## 2014-09-12 DIAGNOSIS — R569 Unspecified convulsions: Secondary | ICD-10-CM | POA: Diagnosis not present

## 2014-09-12 DIAGNOSIS — L0231 Cutaneous abscess of buttock: Secondary | ICD-10-CM | POA: Diagnosis not present

## 2014-09-12 DIAGNOSIS — G809 Cerebral palsy, unspecified: Secondary | ICD-10-CM | POA: Diagnosis not present

## 2014-09-17 DIAGNOSIS — R569 Unspecified convulsions: Secondary | ICD-10-CM | POA: Diagnosis not present

## 2014-09-17 DIAGNOSIS — G809 Cerebral palsy, unspecified: Secondary | ICD-10-CM | POA: Diagnosis not present

## 2014-09-17 DIAGNOSIS — L0231 Cutaneous abscess of buttock: Secondary | ICD-10-CM | POA: Diagnosis not present

## 2014-09-24 DIAGNOSIS — G809 Cerebral palsy, unspecified: Secondary | ICD-10-CM | POA: Diagnosis not present

## 2014-09-24 DIAGNOSIS — R569 Unspecified convulsions: Secondary | ICD-10-CM | POA: Diagnosis not present

## 2014-09-24 DIAGNOSIS — L0231 Cutaneous abscess of buttock: Secondary | ICD-10-CM | POA: Diagnosis not present

## 2014-10-01 DIAGNOSIS — L0231 Cutaneous abscess of buttock: Secondary | ICD-10-CM | POA: Diagnosis not present

## 2014-10-01 DIAGNOSIS — R569 Unspecified convulsions: Secondary | ICD-10-CM | POA: Diagnosis not present

## 2014-10-01 DIAGNOSIS — G809 Cerebral palsy, unspecified: Secondary | ICD-10-CM | POA: Diagnosis not present

## 2014-10-02 DIAGNOSIS — R569 Unspecified convulsions: Secondary | ICD-10-CM | POA: Diagnosis not present

## 2014-10-02 DIAGNOSIS — G809 Cerebral palsy, unspecified: Secondary | ICD-10-CM | POA: Diagnosis not present

## 2014-10-02 DIAGNOSIS — L0231 Cutaneous abscess of buttock: Secondary | ICD-10-CM | POA: Diagnosis not present

## 2014-11-19 DIAGNOSIS — M79675 Pain in left toe(s): Secondary | ICD-10-CM | POA: Diagnosis not present

## 2014-11-19 DIAGNOSIS — B351 Tinea unguium: Secondary | ICD-10-CM | POA: Diagnosis not present

## 2015-01-30 DIAGNOSIS — L0231 Cutaneous abscess of buttock: Secondary | ICD-10-CM | POA: Diagnosis not present

## 2015-01-30 DIAGNOSIS — R569 Unspecified convulsions: Secondary | ICD-10-CM | POA: Diagnosis not present

## 2015-01-30 DIAGNOSIS — G809 Cerebral palsy, unspecified: Secondary | ICD-10-CM | POA: Diagnosis not present

## 2015-02-04 ENCOUNTER — Emergency Department (HOSPITAL_COMMUNITY)
Admission: EM | Admit: 2015-02-04 | Discharge: 2015-02-05 | Disposition: A | Discharge status: Skilled Nursing Facility | Payer: Medicare Other | Attending: Emergency Medicine | Admitting: Emergency Medicine

## 2015-02-04 ENCOUNTER — Encounter (HOSPITAL_COMMUNITY): Payer: Self-pay | Admitting: Emergency Medicine

## 2015-02-04 DIAGNOSIS — Z792 Long term (current) use of antibiotics: Secondary | ICD-10-CM | POA: Diagnosis not present

## 2015-02-04 DIAGNOSIS — G40909 Epilepsy, unspecified, not intractable, without status epilepticus: Secondary | ICD-10-CM | POA: Diagnosis not present

## 2015-02-04 DIAGNOSIS — M62569 Muscle wasting and atrophy, not elsewhere classified, unspecified lower leg: Secondary | ICD-10-CM | POA: Insufficient documentation

## 2015-02-04 DIAGNOSIS — R569 Unspecified convulsions: Secondary | ICD-10-CM | POA: Diagnosis not present

## 2015-02-04 DIAGNOSIS — Z79899 Other long term (current) drug therapy: Secondary | ICD-10-CM | POA: Diagnosis not present

## 2015-02-04 DIAGNOSIS — Z86018 Personal history of other benign neoplasm: Secondary | ICD-10-CM | POA: Insufficient documentation

## 2015-02-04 NOTE — ED Notes (Signed)
Pt from [facility] nursing facility and brother witnessed pt having seizure. Pt has hx of seizures.

## 2015-02-05 DIAGNOSIS — G40909 Epilepsy, unspecified, not intractable, without status epilepticus: Secondary | ICD-10-CM | POA: Diagnosis not present

## 2015-02-05 DIAGNOSIS — Z7401 Bed confinement status: Secondary | ICD-10-CM | POA: Diagnosis not present

## 2015-02-05 DIAGNOSIS — F4489 Other dissociative and conversion disorders: Secondary | ICD-10-CM | POA: Diagnosis not present

## 2015-02-05 LAB — BASIC METABOLIC PANEL
Anion gap: 5 (ref 5–15)
BUN: 13 mg/dL (ref 6–20)
CO2: 26 mmol/L (ref 22–32)
Calcium: 9.1 mg/dL (ref 8.9–10.3)
Chloride: 101 mmol/L (ref 101–111)
Creatinine, Ser: 0.82 mg/dL (ref 0.61–1.24)
GFR calc Af Amer: >60 mL/min (ref >60)
GFR calc non Af Amer: >60 mL/min (ref >60)
Glucose, Bld: 232 mg/dL — ABNORMAL HIGH (ref 65–99)
Potassium: 4.3 mmol/L (ref 3.5–5.1)
Sodium: 132 mmol/L — ABNORMAL LOW (ref 135–145)

## 2015-02-05 LAB — CARBAMAZEPINE LEVEL, TOTAL: Carbamazepine Lvl: 9.3 ug/mL (ref 4.0–12.0)

## 2015-02-05 NOTE — ED Provider Notes (Signed)
CSN: [id number]     Arrival date & time 02/04/15  2328 History   By signing my name below, I, Jeremy Parks, attest that this documentation has been prepared under the direction and in the presence of Jeremy Porter, MD. Electronically Signed: Forrestine Parks, ED Scribe. 02/05/2015. 12:20 AM.   Chief Complaint  Patient presents with  . Seizures   The history is provided by the patient. No language interpreter was used.    HPI Comments: Jeremy Parks from [facility] facility is a 64 y.o. male with a PMHx of cerebral palsy and seizures who presents to the Emergency Department here after a witnessed seizure this evening. Pt states typically he gets a HA prior to episodes which he reports this evening. Denies any pain at this time. Last known normal earlier this afternoon. Jeremy Parks is not a drinker and denies any smoking habits.no known allergies to medications. When asked how often he gets seizures he states "once in a while". He denies any injury from his seizure today.  PCP Dr Jeremy Parks  Past Medical History  Diagnosis Date  . Cerebral palsy ([place])   . Seizures ([place])   . Lipoma of abdominal wall    History reviewed. No pertinent past surgical history. History reviewed. No pertinent family history. Social History  Substance Use Topics  . Smoking status: Never Smoker   . Smokeless tobacco: None  . Alcohol Use: No  lives in a NH nonambulatory  Review of Systems  Constitutional: Negative for fever and chills.  Respiratory: Negative for cough and shortness of breath.   Cardiovascular: Negative for chest pain.  Gastrointestinal: Negative for nausea, vomiting and abdominal pain.  Genitourinary: Negative for dysuria.  Musculoskeletal: Negative for back pain.  Skin: Negative for rash.  Neurological: Positive for seizures.  Psychiatric/Behavioral: Negative for confusion.  All other systems reviewed and are negative.     Allergies  Review of patient's allergies indicates no known  allergies.  Home Medications   Prior to Admission medications   Medication Sig Start Date End Date Taking? Authorizing Provider  acetaminophen (TYLENOL) 500 MG tablet Take 500 mg by mouth every 6 (six) hours as needed (pain).    Historical Provider, MD  azithromycin (ZITHROMAX) 250 MG tablet Take one tab daily with food until all taken. Patient not taking: Reported on 07/25/2014 08/09/12   Lily Kocher, PA-C  carbamazepine (TEGRETOL) 200 MG tablet Take 200 mg by mouth 2 (two) times daily.    Historical Provider, MD  chlorhexidine (PERIDEX) 0.12 % solution Use as directed 15 mLs in the mouth or throat daily. **Swish 15 mls for 30 seconds then spit out. USE DAILY**    Historical Provider, MD  HYDROcodone-acetaminophen (NORCO/VICODIN) 5-325 MG per tablet Take 1 tablet by mouth every 4 (four) hours as needed for pain. Patient not taking: Reported on 07/25/2014 08/09/12   Lily Kocher, PA-C  ondansetron (ZOFRAN ODT) 4 MG disintegrating tablet Take 1 tablet (4 mg total) by mouth every 8 (eight) hours as needed. 02/16/14   Fredia Sorrow, MD  PEG 3350-KCl-NaBcb-NaCl-NaSulf (PEG 3350/ELECTROLYTES) 240 G SOLR Take 240 mLs by mouth every 15 (fifteen) minutes. 0/34/74   Delora Fuel, MD  sulfamethoxazole-trimethoprim (SEPTRA DS) 800-160 MG per tablet Take 1 tablet by mouth 2 (two) times daily. 07/26/14   Nat Christen, MD   Triage Vitals: BP 137/87 mmHg  Pulse 80  Temp(Src) 97 F (36.1 C) (Oral)  Resp 18  SpO2 94%  Vital signs normal     Physical Exam  Constitutional: He is oriented to person, place, and time. He appears well-developed and well-nourished.  Non-toxic appearance. He does not appear ill. No distress.  HENT:  Head: Normocephalic and atraumatic.  Right Ear: External ear normal.  Left Ear: External ear normal.  Nose: Nose normal. No mucosal edema or rhinorrhea.  Mouth/Throat: Oropharynx is clear and moist and mucous membranes are normal. No dental abscesses or uvula swelling.   Edentulous No trauma to tongue   Eyes: Conjunctivae and EOM are normal. Pupils are equal, round, and reactive to light.  Neck: Normal range of motion and full passive range of motion without pain. Neck supple.  Cardiovascular: Normal rate, regular rhythm and normal heart sounds.  Exam reveals no gallop and no friction rub.   No murmur heard. Pulmonary/Chest: Effort normal and breath sounds normal. No respiratory distress. He has no wheezes. He has no rhonchi. He has no rales. He exhibits no tenderness and no crepitus.  Abdominal: Soft. Normal appearance and bowel sounds are normal. He exhibits no distension. There is no tenderness. There is no rebound and no guarding.  Musculoskeletal: Normal range of motion. He exhibits no edema or tenderness.   Has muscle wasting of legs consistent with Parks being non ambulatory   Neurological: He is alert and oriented to person, place, and time. He has normal strength. No cranial nerve deficit.  Skin: Skin is warm, dry and intact. No rash noted. No erythema. No pallor.  Psychiatric: He has a normal mood and affect. His speech is normal and behavior is normal. His mood appears not anxious.  Nursing note and vitals reviewed.   ED Course  Procedures (including critical care time)  DIAGNOSTIC STUDIES: Oxygen Saturation is 94% on RA, adequate by my interpretation.    COORDINATION OF CARE: 12:37 AM- Will order carbamazepine level and BMP. Discussed treatment plan with pt at bedside and pt agreed to plan.     Labs Review Results for orders placed or performed during the hospital encounter of 02/04/15  Carbamazepine level, total  Result Value Ref Range   Carbamazepine Lvl 9.3 4.0 - 12.0 ug/mL  Basic metabolic panel  Result Value Ref Range   Sodium 132 (L) 135 - 145 mmol/L   Potassium 4.3 3.5 - 5.1 mmol/L   Chloride 101 101 - 111 mmol/L   CO2 26 22 - 32 mmol/L   Glucose, Bld 232 (H) 65 - 99 mg/dL   BUN 13 6 - 20 mg/dL   Creatinine, Ser 0.82 0.61 -  1.24 mg/dL   Calcium 9.1 8.9 - 10.3 mg/dL   GFR calc non Af Amer >60 >60 mL/min   GFR calc Af Amer >60 >60 mL/min   Anion gap 5 5 - 15   Laboratory interpretation all normal except hyperglycemia, mild hyponatremia. Tegretol level is therapeutic     Imaging Review No results found. I have personally reviewed and evaluated these images and lab results as part of my medical decision-making.   EKG Interpretation None      MDM   patient presents with a seizure and known seizure disorder on tegretol. He is not able to tell me how often he has seizures. I am going to ask the nursing home to keep a log of his seizure activity. His Tegretol level is therapeutic. This is the patient's first ED visit in 6 months so I am going to assume this is his first seizure in 6 months. His medication was not adjusted or changed tonight. He can follow-up with a  neurologist to discuss his seizure medications. Patient seemed to be back to his baseline at the time of his exam.    Final diagnoses:  Seizure disorder [facility])   Plan discharge  Jeremy Porter, MD, FACEP   I personally performed the services described in this documentation, which was scribed in my presence. The recorded information has been reviewed and considered.  Jeremy Porter, MD, Barbette Or, MD 02/05/15 [phone]

## 2015-02-05 NOTE — Discharge Instructions (Signed)
Your tegretol (carbamaepine) level was therapeutic tonight at 9.3. Please discuss with your neurologist if you have one or you can be seen by Dr Merlene Laughter to discuss your seizure medication. Please have someone at your nursing facility write down every time you have a seizure on a piece of paper so it can be reviewed to see how often you are having seizures, and if your medication needs to be adjusted or changed.

## 2015-02-11 DIAGNOSIS — M79672 Pain in left foot: Secondary | ICD-10-CM | POA: Diagnosis not present

## 2015-02-11 DIAGNOSIS — M79675 Pain in left toe(s): Secondary | ICD-10-CM | POA: Diagnosis not present

## 2015-02-11 DIAGNOSIS — B351 Tinea unguium: Secondary | ICD-10-CM | POA: Diagnosis not present

## 2015-02-19 DIAGNOSIS — G809 Cerebral palsy, unspecified: Secondary | ICD-10-CM | POA: Diagnosis not present

## 2015-02-19 DIAGNOSIS — R569 Unspecified convulsions: Secondary | ICD-10-CM | POA: Diagnosis not present

## 2015-03-07 DIAGNOSIS — G809 Cerebral palsy, unspecified: Secondary | ICD-10-CM | POA: Diagnosis not present

## 2015-03-07 DIAGNOSIS — R569 Unspecified convulsions: Secondary | ICD-10-CM | POA: Diagnosis not present

## 2015-03-31 DIAGNOSIS — R569 Unspecified convulsions: Secondary | ICD-10-CM | POA: Diagnosis not present

## 2015-03-31 DIAGNOSIS — G809 Cerebral palsy, unspecified: Secondary | ICD-10-CM | POA: Diagnosis not present

## 2015-03-31 DIAGNOSIS — L0231 Cutaneous abscess of buttock: Secondary | ICD-10-CM | POA: Diagnosis not present

## 2015-04-03 DIAGNOSIS — R569 Unspecified convulsions: Secondary | ICD-10-CM | POA: Diagnosis not present

## 2015-04-03 DIAGNOSIS — G809 Cerebral palsy, unspecified: Secondary | ICD-10-CM | POA: Diagnosis not present

## 2015-05-21 DIAGNOSIS — M79675 Pain in left toe(s): Secondary | ICD-10-CM | POA: Diagnosis not present

## 2015-05-21 DIAGNOSIS — B351 Tinea unguium: Secondary | ICD-10-CM | POA: Diagnosis not present

## 2015-05-21 DIAGNOSIS — M79674 Pain in right toe(s): Secondary | ICD-10-CM | POA: Diagnosis not present

## 2015-06-11 DIAGNOSIS — R569 Unspecified convulsions: Secondary | ICD-10-CM | POA: Diagnosis not present

## 2015-07-29 DIAGNOSIS — R569 Unspecified convulsions: Secondary | ICD-10-CM | POA: Diagnosis not present

## 2015-07-29 DIAGNOSIS — G809 Cerebral palsy, unspecified: Secondary | ICD-10-CM | POA: Diagnosis not present

## 2015-07-29 DIAGNOSIS — L0231 Cutaneous abscess of buttock: Secondary | ICD-10-CM | POA: Diagnosis not present

## 2015-08-20 DIAGNOSIS — G809 Cerebral palsy, unspecified: Secondary | ICD-10-CM | POA: Diagnosis not present

## 2015-08-20 DIAGNOSIS — R569 Unspecified convulsions: Secondary | ICD-10-CM | POA: Diagnosis not present

## 2015-09-04 DIAGNOSIS — B351 Tinea unguium: Secondary | ICD-10-CM | POA: Diagnosis not present

## 2015-09-04 DIAGNOSIS — M79674 Pain in right toe(s): Secondary | ICD-10-CM | POA: Diagnosis not present

## 2015-09-04 DIAGNOSIS — M79675 Pain in left toe(s): Secondary | ICD-10-CM | POA: Diagnosis not present

## 2015-11-27 DIAGNOSIS — M79675 Pain in left toe(s): Secondary | ICD-10-CM | POA: Diagnosis not present

## 2015-11-27 DIAGNOSIS — B351 Tinea unguium: Secondary | ICD-10-CM | POA: Diagnosis not present

## 2015-11-27 DIAGNOSIS — M79674 Pain in right toe(s): Secondary | ICD-10-CM | POA: Diagnosis not present

## 2016-02-19 DIAGNOSIS — H25813 Combined forms of age-related cataract, bilateral: Secondary | ICD-10-CM | POA: Diagnosis not present

## 2016-02-19 DIAGNOSIS — R569 Unspecified convulsions: Secondary | ICD-10-CM | POA: Diagnosis not present

## 2016-02-19 DIAGNOSIS — G809 Cerebral palsy, unspecified: Secondary | ICD-10-CM | POA: Diagnosis not present

## 2016-02-19 DIAGNOSIS — Z23 Encounter for immunization: Secondary | ICD-10-CM | POA: Diagnosis not present

## 2016-03-12 DIAGNOSIS — M79675 Pain in left toe(s): Secondary | ICD-10-CM | POA: Diagnosis not present

## 2016-03-12 DIAGNOSIS — B351 Tinea unguium: Secondary | ICD-10-CM | POA: Diagnosis not present

## 2016-06-03 ENCOUNTER — Emergency Department (HOSPITAL_COMMUNITY)
Admission: EM | Admit: 2016-06-03 | Discharge: 2016-06-03 | Disposition: A | Discharge status: Home / Self Care | Payer: Medicare Other | Attending: Emergency Medicine | Admitting: Emergency Medicine

## 2016-06-03 ENCOUNTER — Encounter (HOSPITAL_COMMUNITY): Payer: Self-pay | Admitting: *Deleted

## 2016-06-03 DIAGNOSIS — R569 Unspecified convulsions: Secondary | ICD-10-CM

## 2016-06-03 DIAGNOSIS — E108 Type 1 diabetes mellitus with unspecified complications: Secondary | ICD-10-CM | POA: Diagnosis not present

## 2016-06-03 DIAGNOSIS — G40909 Epilepsy, unspecified, not intractable, without status epilepticus: Secondary | ICD-10-CM | POA: Diagnosis not present

## 2016-06-03 DIAGNOSIS — Z7401 Bed confinement status: Secondary | ICD-10-CM | POA: Diagnosis not present

## 2016-06-03 DIAGNOSIS — R279 Unspecified lack of coordination: Secondary | ICD-10-CM | POA: Diagnosis not present

## 2016-06-03 LAB — CBC WITH DIFFERENTIAL/PLATELET
Basophils Absolute: 0 10*3/uL (ref 0.0–0.1)
Basophils Relative: 0 %
Eosinophils Absolute: 0.1 10*3/uL (ref 0.0–0.7)
Eosinophils Relative: 2 %
HCT: 41.6 % (ref 39.0–52.0)
Hemoglobin: 14.5 g/dL (ref 13.0–17.0)
Lymphocytes Relative: 41 %
Lymphs Abs: 2.2 10*3/uL (ref 0.7–4.0)
MCH: 28.7 pg (ref 26.0–34.0)
MCHC: 34.9 g/dL (ref 30.0–36.0)
MCV: 82.4 fL (ref 78.0–100.0)
Monocytes Absolute: 0.5 10*3/uL (ref 0.1–1.0)
Monocytes Relative: 9 %
Neutro Abs: 2.5 10*3/uL (ref 1.7–7.7)
Neutrophils Relative %: 48 %
Platelets: 174 10*3/uL (ref 150–400)
RBC: 5.05 MIL/uL (ref 4.22–5.81)
RDW: 12.5 % (ref 11.5–15.5)
WBC: 5.3 10*3/uL (ref 4.0–10.5)

## 2016-06-03 LAB — BASIC METABOLIC PANEL
Anion gap: 7 (ref 5–15)
BUN: 13 mg/dL (ref 6–20)
CO2: 27 mmol/L (ref 22–32)
Calcium: 8.8 mg/dL — ABNORMAL LOW (ref 8.9–10.3)
Chloride: 99 mmol/L — ABNORMAL LOW (ref 101–111)
Creatinine, Ser: 0.73 mg/dL (ref 0.61–1.24)
GFR calc Af Amer: >60 mL/min (ref >60)
GFR calc non Af Amer: >60 mL/min (ref >60)
Glucose, Bld: 317 mg/dL — ABNORMAL HIGH (ref 65–99)
Potassium: 4.1 mmol/L (ref 3.5–5.1)
Sodium: 133 mmol/L — ABNORMAL LOW (ref 135–145)

## 2016-06-03 LAB — I-STAT CHEM 8, ED
BUN: 17 mg/dL (ref 6–20)
Calcium, Ion: 1.13 mmol/L — ABNORMAL LOW (ref 1.15–1.40)
Chloride: 98 mmol/L — ABNORMAL LOW (ref 101–111)
Creatinine, Ser: 0.7 mg/dL (ref 0.61–1.24)
Glucose, Bld: 342 mg/dL — ABNORMAL HIGH (ref 65–99)
HCT: 42 % (ref 39.0–52.0)
Hemoglobin: 14.3 g/dL (ref 13.0–17.0)
Potassium: 5.8 mmol/L — ABNORMAL HIGH (ref 3.5–5.1)
Sodium: 134 mmol/L — ABNORMAL LOW (ref 135–145)
TCO2: 30 mmol/L (ref 0–100)

## 2016-06-03 MED ORDER — CARBAMAZEPINE 200 MG PO TABS
200.0000 mg | ORAL_TABLET | Freq: Once | ORAL | Status: AC
  Administered 2016-06-03: 200 mg via ORAL
  Filled 2016-06-03: qty 1

## 2016-06-03 MED ORDER — SODIUM CHLORIDE 0.9 % IV BOLUS (SEPSIS)
500.0000 mL | Freq: Once | INTRAVENOUS | Status: AC
  Administered 2016-06-03: 500 mL via INTRAVENOUS

## 2016-06-03 NOTE — ED Triage Notes (Addendum)
Pt brought in by [facility] from [facility]. Staff at [employer] states they walked into the patients room and witnessed the pt having a seizure. Staff states that the pt had a witnessed 2 minute seizure. Pt was alert upon arrival. When the pt was asked what happened, he reported, "I had a seizure." [facility] reports that the pt's CBG was 457.

## 2016-06-03 NOTE — ED Notes (Signed)
Pt offered food and fluids.

## 2016-06-03 NOTE — ED Provider Notes (Signed)
[facility] DEPT Provider Note   CSN: [id number] Arrival date & time: 06/03/16  [id number]     History   Chief Complaint Chief Complaint  Patient presents with  . Seizures    HPI Jeremy Parks is a 66 y.o. male.   Seizures   This is a recurrent problem. The current episode started less than 1 hour ago. The problem has not changed since onset.There was 1 seizure. The most recent episode lasted 2 to 5 minutes. Pertinent negatives include no sleepiness and no neck stiffness. Characteristics include rhythmic jerking and loss of consciousness. The episode was witnessed. The seizures did not continue in the ED. The seizure(s) had no focality. There has been no fever.   Discussed with nursing home and patient was found this morning to be having a tonic/clonic seizure associated with LOC and shaking. Lasted about two minutes. Was postictal but normal VS with EMS. Alert on arrival here. Facility states no recent illnesses, has been eating/drinking/acting normally.  Past Medical History:  Diagnosis Date  . Cerebral palsy ([place])   . Lipoma of abdominal wall   . Seizures ([place])     There are no active problems to display for this patient.   History reviewed. No pertinent surgical history.     Home Medications    Prior to Admission medications   Medication Sig Start Date End Date Taking? Authorizing Provider  acetaminophen (TYLENOL) 500 MG tablet Take 500 mg by mouth every 6 (six) hours as needed (pain).    Historical Provider, MD  carbamazepine (TEGRETOL) 200 MG tablet Take 200 mg by mouth 2 (two) times daily.    Historical Provider, MD  chlorhexidine (PERIDEX) 0.12 % solution Use as directed 15 mLs in the mouth or throat daily. **Swish 15 mls for 30 seconds then spit out. USE DAILY**    Historical Provider, MD  ondansetron (ZOFRAN ODT) 4 MG disintegrating tablet Take 1 tablet (4 mg total) by mouth every 8 (eight) hours as needed. 02/16/14   Fredia Sorrow, MD  PEG  3350-KCl-NaBcb-NaCl-NaSulf (PEG 3350/ELECTROLYTES) 240 G SOLR Take 240 mLs by mouth every 15 (fifteen) minutes. [id number]   Delora Fuel, MD    Family History History reviewed. No pertinent family history.  Social History Social History  Substance Use Topics  . Smoking status: Never Smoker  . Smokeless tobacco: Never Used  . Alcohol use No     Allergies   Patient has no known allergies.   Review of Systems Review of Systems  Neurological: Positive for seizures and loss of consciousness.  All other systems reviewed and are negative.    Physical Exam Updated Vital Signs BP 115/82   Pulse 77   Temp 97.5 F (36.4 C) (Oral)   Resp 24   SpO2 95%   Physical Exam  Constitutional: He is oriented to person, place, and time. He appears well-developed and well-nourished.  HENT:  Head: Normocephalic and atraumatic.  Eyes: Conjunctivae and EOM are normal.  Neck: Normal range of motion.  Cardiovascular: Normal rate.   Pulmonary/Chest: Effort normal. No respiratory distress.  Abdominal: Soft. He exhibits no distension.  Musculoskeletal: Normal range of motion.  Neurological: He is alert and oriented to person, place, and time. No cranial nerve deficit.  Nursing note and vitals reviewed.    ED Treatments / Results  Labs (all labs ordered are listed, but only abnormal results are displayed) Labs Reviewed  BASIC METABOLIC PANEL - Abnormal; Notable for the following:       Result Value  Sodium 133 (*)    Chloride 99 (*)    Glucose, Bld 317 (*)    Calcium 8.8 (*)    All other components within normal limits  I-STAT CHEM 8, ED - Abnormal; Notable for the following:    Sodium 134 (*)    Potassium 5.8 (*)    Chloride 98 (*)    Glucose, Bld 342 (*)    Calcium, Ion 1.13 (*)    All other components within normal limits  CBC WITH DIFFERENTIAL/PLATELET    EKG  EKG Interpretation  Date/Time:  Wednesday June 03 2016 06:48:48 EST Ventricular Rate:  77 PR Interval:    QRS  Duration: 88 QT Interval:  387 QTC Calculation: 438 R Axis:   54 Text Interpretation:  Sinus rhythm Short PR interval Abnormal R-wave progression, early transition Electrode noise No old tracing to compare Confirmed by KNAPP  MD-I, IVA ([id number]) on 06/03/2016 6:59:24 AM       Radiology No results found.  Procedures Procedures (including critical care time)  Medications Ordered in ED Medications  carbamazepine (TEGRETOL) tablet 200 mg (not administered)  sodium chloride 0.9 % bolus 500 mL (500 mLs Intravenous New Bag/Given 06/03/16 0804)     Initial Impression / Assessment and Plan / ED Course  I have reviewed the triage vital signs and the nursing notes.  Pertinent labs & imaging results that were available during my care of the patient were reviewed by me and considered in my medical decision making (see chart for details).   Labs ok. Ms ok. ecg ok. lilkely breakthrough seizure. Will give morning dose of tegretol here. Dc to facility for pcp/neuro follow up as dictated by neurologist.   Final Clinical Impressions(s) / ED Diagnoses   Final diagnoses:  Seizure [facility])    New Prescriptions Current Discharge Medication List       Merrily Pew, MD 06/03/16 1002

## 2016-06-11 DIAGNOSIS — R739 Hyperglycemia, unspecified: Secondary | ICD-10-CM | POA: Diagnosis not present

## 2016-06-11 DIAGNOSIS — G809 Cerebral palsy, unspecified: Secondary | ICD-10-CM | POA: Diagnosis not present

## 2016-06-11 DIAGNOSIS — R69 Illness, unspecified: Secondary | ICD-10-CM | POA: Diagnosis not present

## 2016-06-17 DIAGNOSIS — G809 Cerebral palsy, unspecified: Secondary | ICD-10-CM | POA: Diagnosis not present

## 2016-06-17 DIAGNOSIS — R739 Hyperglycemia, unspecified: Secondary | ICD-10-CM | POA: Diagnosis not present

## 2016-06-29 DIAGNOSIS — M79674 Pain in right toe(s): Secondary | ICD-10-CM | POA: Diagnosis not present

## 2016-06-29 DIAGNOSIS — B351 Tinea unguium: Secondary | ICD-10-CM | POA: Diagnosis not present

## 2016-07-03 DIAGNOSIS — E1165 Type 2 diabetes mellitus with hyperglycemia: Secondary | ICD-10-CM | POA: Diagnosis not present

## 2016-07-03 DIAGNOSIS — G890 Central pain syndrome: Secondary | ICD-10-CM | POA: Diagnosis not present

## 2016-07-03 DIAGNOSIS — R569 Unspecified convulsions: Secondary | ICD-10-CM | POA: Diagnosis not present

## 2016-07-15 ENCOUNTER — Encounter (HOSPITAL_COMMUNITY): Payer: Self-pay | Admitting: Emergency Medicine

## 2016-07-15 ENCOUNTER — Emergency Department (HOSPITAL_COMMUNITY)
Admission: EM | Admit: 2016-07-15 | Discharge: 2016-07-15 | Disposition: A | Discharge status: Home / Self Care | Payer: Medicare Other | Attending: Emergency Medicine | Admitting: Emergency Medicine

## 2016-07-15 DIAGNOSIS — Z79899 Other long term (current) drug therapy: Secondary | ICD-10-CM | POA: Insufficient documentation

## 2016-07-15 DIAGNOSIS — D1739 Benign lipomatous neoplasm of skin and subcutaneous tissue of other sites: Secondary | ICD-10-CM | POA: Insufficient documentation

## 2016-07-15 DIAGNOSIS — R402441 Other coma, without documented Glasgow coma scale score, or with partial score reported, in the field [EMT or ambulance]: Secondary | ICD-10-CM | POA: Diagnosis not present

## 2016-07-15 DIAGNOSIS — R531 Weakness: Secondary | ICD-10-CM | POA: Insufficient documentation

## 2016-07-15 DIAGNOSIS — Z7401 Bed confinement status: Secondary | ICD-10-CM | POA: Diagnosis not present

## 2016-07-15 DIAGNOSIS — G40909 Epilepsy, unspecified, not intractable, without status epilepticus: Secondary | ICD-10-CM | POA: Diagnosis not present

## 2016-07-15 DIAGNOSIS — R197 Diarrhea, unspecified: Secondary | ICD-10-CM

## 2016-07-15 DIAGNOSIS — R279 Unspecified lack of coordination: Secondary | ICD-10-CM | POA: Diagnosis not present

## 2016-07-15 LAB — BASIC METABOLIC PANEL
Anion gap: 6 (ref 5–15)
BUN: 12 mg/dL (ref 6–20)
CO2: 28 mmol/L (ref 22–32)
Calcium: 9.1 mg/dL (ref 8.9–10.3)
Chloride: 101 mmol/L (ref 101–111)
Creatinine, Ser: 0.67 mg/dL (ref 0.61–1.24)
GFR calc Af Amer: >60 mL/min (ref >60)
GFR calc non Af Amer: >60 mL/min (ref >60)
Glucose, Bld: 138 mg/dL — ABNORMAL HIGH (ref 65–99)
Potassium: 3.9 mmol/L (ref 3.5–5.1)
Sodium: 135 mmol/L (ref 135–145)

## 2016-07-15 LAB — CBC WITH DIFFERENTIAL/PLATELET
Basophils Absolute: 0 10*3/uL (ref 0.0–0.1)
Basophils Relative: 0 %
Eosinophils Absolute: 0.1 10*3/uL (ref 0.0–0.7)
Eosinophils Relative: 2 %
HCT: 37.8 % — ABNORMAL LOW (ref 39.0–52.0)
Hemoglobin: 13.2 g/dL (ref 13.0–17.0)
Lymphocytes Relative: 45 %
Lymphs Abs: 2.6 10*3/uL (ref 0.7–4.0)
MCH: 29.2 pg (ref 26.0–34.0)
MCHC: 34.9 g/dL (ref 30.0–36.0)
MCV: 83.6 fL (ref 78.0–100.0)
Monocytes Absolute: 0.5 10*3/uL (ref 0.1–1.0)
Monocytes Relative: 8 %
Neutro Abs: 2.6 10*3/uL (ref 1.7–7.7)
Neutrophils Relative %: 45 %
Platelets: 177 10*3/uL (ref 150–400)
RBC: 4.52 MIL/uL (ref 4.22–5.81)
RDW: 12.5 % (ref 11.5–15.5)
WBC: 5.8 10*3/uL (ref 4.0–10.5)

## 2016-07-15 MED ORDER — SODIUM CHLORIDE 0.9 % IV SOLN
1000.0000 mL | Freq: Once | INTRAVENOUS | Status: AC
  Administered 2016-07-15: 1000 mL via INTRAVENOUS

## 2016-07-15 MED ORDER — TOBRAMYCIN 0.3 % OP SOLN: 2.0000 [drp] | OPHTHALMIC | Status: DC

## 2016-07-15 MED ORDER — TOBRAMYCIN 0.3 % OP SOLN
OPHTHALMIC | Status: AC
  Filled 2016-07-15: qty 5

## 2016-07-15 MED ORDER — LOPERAMIDE HCL 2 MG PO CAPS
2.0000 mg | ORAL_CAPSULE | Freq: Once | ORAL | Status: AC
  Administered 2016-07-15: 2 mg via ORAL
  Filled 2016-07-15: qty 1

## 2016-07-15 MED ORDER — SODIUM CHLORIDE 0.9 % IV SOLN: 1000.0000 mL | INTRAVENOUS | Status: DC

## 2016-07-15 NOTE — ED Provider Notes (Signed)
[facility] DEPT Provider Note   CSN: [id number] Arrival date & time: 07/15/16  8588     History   Chief Complaint Chief Complaint  Patient presents with  . Diarrhea    HPI Jeremy Parks is a 66 y.o. male.  Patient is a 66 year old male who presents to the emergency department from a local nursing facility because of diarrhea.  Information from the nursing facility, as well as from the patient report that the patient has recently been started on a new medication for his diabetes. Review of the records sent from the nursing facility indicate Januvia. Patient states since he's been on this medication he's been having diarrhea. According to patient and staff there's been 3 or 4 loose stools in the last 24 hours. The patient is eating and drinking. There's been no high fevers reported. It is also of note that the patient is on a stool softener. No recent injury or trauma to the abdomen. The patient has a history of cerebral palsy. Patient was sent to the emergency department for evaluation at this time.   The history is provided by the patient.  Diarrhea      Past Medical History:  Diagnosis Date  . Cerebral palsy ([place])   . Lipoma of abdominal wall   . Seizures ([place])     There are no active problems to display for this patient.   History reviewed. No pertinent surgical history.     Home Medications    Prior to Admission medications   Medication Sig Start Date End Date Taking? Authorizing Provider  carbamazepine (TEGRETOL) 200 MG tablet Take 200 mg by mouth 2 (two) times daily.   Yes Historical Provider, MD  chlorhexidine (PERIDEX) 0.12 % solution Use as directed 15 mLs in the mouth or throat daily. **Swish 15 mls for 30 seconds then spit out. USE DAILY**   Yes Historical Provider, MD  docusate sodium (COLACE) 100 MG capsule Take 100 mg by mouth 2 (two) times daily.   Yes Historical Provider, MD  nystatin (MYCOSTATIN/NYSTOP) powder Apply topically 4 (four) times daily.  Apply topically to affected area of groin twice daily as needed for rash.   Yes Historical Provider, MD  SitaGLIPtin-MetFORMIN HCl (JANUMET XR) [phone] MG TB24 Take 1 tablet by mouth daily.   Yes Historical Provider, MD    Family History History reviewed. No pertinent family history.  Social History Social History  Substance Use Topics  . Smoking status: Never Smoker  . Smokeless tobacco: Never Used  . Alcohol use No     Allergies   Patient has no known allergies.   Review of Systems Review of Systems  Gastrointestinal: Positive for diarrhea.  Neurological: Positive for seizures and weakness.  All other systems reviewed and are negative.    Physical Exam Updated Vital Signs BP 130/97 (BP Location: Left Arm)   Pulse 90   Temp 98.5 F (36.9 C) (Oral)   Resp 19   Ht 5\' 10"  (1.778 m)   Wt 99.8 kg   SpO2 98%   BMI 31.57 kg/m   Physical Exam  Constitutional: He is oriented to person, place, and time. He appears well-developed and well-nourished.  Non-toxic appearance.  HENT:  Head: Normocephalic.  Right Ear: Tympanic membrane and external ear normal.  Left Ear: Tympanic membrane and external ear normal.  Eyes: EOM and lids are normal. Pupils are equal, round, and reactive to light.  Neck: Normal range of motion. Neck supple. Carotid bruit is not present.  Cardiovascular:  Normal rate, regular rhythm, normal heart sounds, intact distal pulses and normal pulses.   Pulmonary/Chest: Breath sounds normal. No respiratory distress.  Abdominal: Soft. Bowel sounds are normal. There is no tenderness. There is no guarding.  Patient has a large lipoma present of the left upper quadrant. This is not new.  Musculoskeletal: Normal range of motion.  The radial pulses on the dorsalis pedis pulses are 2+ bilaterally. The capillary refill is less than 2 seconds bilaterally.  Lymphadenopathy:       Head (right side): No submandibular adenopathy present.       Head (left side): No  submandibular adenopathy present.    He has no cervical adenopathy.  Neurological: He is alert and oriented to person, place, and time. He has normal strength. No cranial nerve deficit or sensory deficit.  Skin: Skin is warm and dry.  Psychiatric: He has a normal mood and affect. His speech is normal.  Nursing note and vitals reviewed.    ED Treatments / Results  Labs (all labs ordered are listed, but only abnormal results are displayed) Labs Reviewed  C DIFFICILE QUICK SCREEN W PCR REFLEX  BASIC METABOLIC PANEL  CBC WITH DIFFERENTIAL/PLATELET    EKG  EKG Interpretation None       Radiology No results found.  Procedures Procedures (including critical care time)  Medications Ordered in ED Medications  0.9 %  sodium chloride infusion (not administered)    Followed by  0.9 %  sodium chloride infusion (not administered)     Initial Impression / Assessment and Plan / ED Course  I have reviewed the triage vital signs and the nursing notes.  Pertinent labs & imaging results that were available during my care of the patient were reviewed by me and considered in my medical decision making (see chart for details).     *I have reviewed nursing notes, vital signs, and all appropriate lab and imaging results for this patient.**  Final Clinical Impressions(s) / ED Diagnoses  MDM Patient sent from the local nursing facility because of repeated problems with diarrhea. Patient is a stool softener. He is also recently started Januvia. Vital signs at this time are within normal limits. The patient will be evaluated for changes in his chemistry. We'll also send a sample for C. difficile as the patient is in a nursing facility.  The basic metabolic panel is well within normal limits. The complete blood count is within normal limits. Several attempts were made to collect a stool specimen for C. difficile, but these were unsuccessful. We will ask the nursing facility to attempt to  collect the specimen and have Dr. Luan Pulling to send this into the lab for evaluation. Patient was given Imodium on 1 tablet here in the emergency department. Patient was also given IV fluids here in the emergency department, which he tolerated without problem. I suspect that given the normal vital signs, that this diarrhea is probably related to the Januvia.    Final diagnoses:  None    New Prescriptions New Prescriptions   No medications on file     Lily Kocher, PA-C 07/15/16 [place], DO 07/16/16 1526

## 2016-07-15 NOTE — ED Notes (Signed)
Pt has small light brown mooshy bm. Pt cleaned

## 2016-07-15 NOTE — ED Triage Notes (Signed)
Pt reports starting a new medication for diabetes and since starting it, has been having diarrhea.  States at first was just in the morning, but now constant.  Pt states 3 stools in last 24 hours.

## 2016-07-15 NOTE — Discharge Instructions (Signed)
Vital signs, complete blood count, and electrolyte chemistries are all within normal limits. Attempted to collect a specimen for C. difficile, but this was unsuccessful. Please collect a stool specimen, and have Dr. Luan Pulling, or member of his team send it to the lab to rule out the possibility of C. difficile. Please increase fluids. Discuss with Dr. Luan Pulling the possibility of selecting another medication other than Januvia, as this may be the source of the diarrhea.

## 2016-07-15 NOTE — ED Notes (Addendum)
Report given to Stacy at [facility] and she is aware pt needing stool specimen for C diff

## 2016-07-21 DIAGNOSIS — G809 Cerebral palsy, unspecified: Secondary | ICD-10-CM | POA: Diagnosis not present

## 2016-07-21 DIAGNOSIS — R569 Unspecified convulsions: Secondary | ICD-10-CM | POA: Diagnosis not present

## 2016-07-21 DIAGNOSIS — R197 Diarrhea, unspecified: Secondary | ICD-10-CM | POA: Diagnosis not present

## 2016-07-21 DIAGNOSIS — E1165 Type 2 diabetes mellitus with hyperglycemia: Secondary | ICD-10-CM | POA: Diagnosis not present

## 2016-08-19 DIAGNOSIS — R197 Diarrhea, unspecified: Secondary | ICD-10-CM | POA: Diagnosis not present

## 2016-08-19 DIAGNOSIS — G809 Cerebral palsy, unspecified: Secondary | ICD-10-CM | POA: Diagnosis not present

## 2016-08-19 DIAGNOSIS — R569 Unspecified convulsions: Secondary | ICD-10-CM | POA: Diagnosis not present

## 2016-08-19 DIAGNOSIS — E1165 Type 2 diabetes mellitus with hyperglycemia: Secondary | ICD-10-CM | POA: Diagnosis not present

## 2016-09-04 DIAGNOSIS — H25813 Combined forms of age-related cataract, bilateral: Secondary | ICD-10-CM | POA: Diagnosis not present

## 2016-09-04 DIAGNOSIS — Z794 Long term (current) use of insulin: Secondary | ICD-10-CM | POA: Diagnosis not present

## 2016-09-04 DIAGNOSIS — E119 Type 2 diabetes mellitus without complications: Secondary | ICD-10-CM | POA: Diagnosis not present

## 2016-09-07 DIAGNOSIS — B351 Tinea unguium: Secondary | ICD-10-CM | POA: Diagnosis not present

## 2016-09-07 DIAGNOSIS — M79674 Pain in right toe(s): Secondary | ICD-10-CM | POA: Diagnosis not present

## 2016-09-07 DIAGNOSIS — M79675 Pain in left toe(s): Secondary | ICD-10-CM | POA: Diagnosis not present

## 2016-09-16 DIAGNOSIS — R2689 Other abnormalities of gait and mobility: Secondary | ICD-10-CM | POA: Diagnosis not present

## 2016-09-16 DIAGNOSIS — G809 Cerebral palsy, unspecified: Secondary | ICD-10-CM | POA: Diagnosis not present

## 2016-09-16 DIAGNOSIS — E119 Type 2 diabetes mellitus without complications: Secondary | ICD-10-CM | POA: Diagnosis not present

## 2016-09-16 DIAGNOSIS — G4089 Other seizures: Secondary | ICD-10-CM | POA: Diagnosis not present

## 2016-09-16 DIAGNOSIS — M6281 Muscle weakness (generalized): Secondary | ICD-10-CM | POA: Diagnosis not present

## 2016-09-18 DIAGNOSIS — E119 Type 2 diabetes mellitus without complications: Secondary | ICD-10-CM | POA: Diagnosis not present

## 2016-09-18 DIAGNOSIS — G809 Cerebral palsy, unspecified: Secondary | ICD-10-CM | POA: Diagnosis not present

## 2016-09-18 DIAGNOSIS — R2689 Other abnormalities of gait and mobility: Secondary | ICD-10-CM | POA: Diagnosis not present

## 2016-09-18 DIAGNOSIS — G4089 Other seizures: Secondary | ICD-10-CM | POA: Diagnosis not present

## 2016-09-18 DIAGNOSIS — M6281 Muscle weakness (generalized): Secondary | ICD-10-CM | POA: Diagnosis not present

## 2016-09-21 DIAGNOSIS — G4089 Other seizures: Secondary | ICD-10-CM | POA: Diagnosis not present

## 2016-09-21 DIAGNOSIS — R2689 Other abnormalities of gait and mobility: Secondary | ICD-10-CM | POA: Diagnosis not present

## 2016-09-21 DIAGNOSIS — G809 Cerebral palsy, unspecified: Secondary | ICD-10-CM | POA: Diagnosis not present

## 2016-09-21 DIAGNOSIS — M6281 Muscle weakness (generalized): Secondary | ICD-10-CM | POA: Diagnosis not present

## 2016-09-21 DIAGNOSIS — E119 Type 2 diabetes mellitus without complications: Secondary | ICD-10-CM | POA: Diagnosis not present

## 2016-09-24 DIAGNOSIS — G4089 Other seizures: Secondary | ICD-10-CM | POA: Diagnosis not present

## 2016-09-24 DIAGNOSIS — M6281 Muscle weakness (generalized): Secondary | ICD-10-CM | POA: Diagnosis not present

## 2016-09-24 DIAGNOSIS — R2689 Other abnormalities of gait and mobility: Secondary | ICD-10-CM | POA: Diagnosis not present

## 2016-09-24 DIAGNOSIS — E119 Type 2 diabetes mellitus without complications: Secondary | ICD-10-CM | POA: Diagnosis not present

## 2016-09-24 DIAGNOSIS — G809 Cerebral palsy, unspecified: Secondary | ICD-10-CM | POA: Diagnosis not present

## 2016-09-29 DIAGNOSIS — R2689 Other abnormalities of gait and mobility: Secondary | ICD-10-CM | POA: Diagnosis not present

## 2016-09-29 DIAGNOSIS — E119 Type 2 diabetes mellitus without complications: Secondary | ICD-10-CM | POA: Diagnosis not present

## 2016-09-29 DIAGNOSIS — G809 Cerebral palsy, unspecified: Secondary | ICD-10-CM | POA: Diagnosis not present

## 2016-09-29 DIAGNOSIS — M6281 Muscle weakness (generalized): Secondary | ICD-10-CM | POA: Diagnosis not present

## 2016-09-29 DIAGNOSIS — G4089 Other seizures: Secondary | ICD-10-CM | POA: Diagnosis not present

## 2016-10-01 DIAGNOSIS — M6281 Muscle weakness (generalized): Secondary | ICD-10-CM | POA: Diagnosis not present

## 2016-10-01 DIAGNOSIS — R2689 Other abnormalities of gait and mobility: Secondary | ICD-10-CM | POA: Diagnosis not present

## 2016-10-01 DIAGNOSIS — G4089 Other seizures: Secondary | ICD-10-CM | POA: Diagnosis not present

## 2016-10-01 DIAGNOSIS — E119 Type 2 diabetes mellitus without complications: Secondary | ICD-10-CM | POA: Diagnosis not present

## 2016-10-01 DIAGNOSIS — G809 Cerebral palsy, unspecified: Secondary | ICD-10-CM | POA: Diagnosis not present

## 2016-10-05 DIAGNOSIS — G4089 Other seizures: Secondary | ICD-10-CM | POA: Diagnosis not present

## 2016-10-05 DIAGNOSIS — M6281 Muscle weakness (generalized): Secondary | ICD-10-CM | POA: Diagnosis not present

## 2016-10-05 DIAGNOSIS — G809 Cerebral palsy, unspecified: Secondary | ICD-10-CM | POA: Diagnosis not present

## 2016-10-05 DIAGNOSIS — E119 Type 2 diabetes mellitus without complications: Secondary | ICD-10-CM | POA: Diagnosis not present

## 2016-10-05 DIAGNOSIS — R2689 Other abnormalities of gait and mobility: Secondary | ICD-10-CM | POA: Diagnosis not present

## 2016-10-08 DIAGNOSIS — G4089 Other seizures: Secondary | ICD-10-CM | POA: Diagnosis not present

## 2016-10-08 DIAGNOSIS — E119 Type 2 diabetes mellitus without complications: Secondary | ICD-10-CM | POA: Diagnosis not present

## 2016-10-08 DIAGNOSIS — M6281 Muscle weakness (generalized): Secondary | ICD-10-CM | POA: Diagnosis not present

## 2016-10-08 DIAGNOSIS — R2689 Other abnormalities of gait and mobility: Secondary | ICD-10-CM | POA: Diagnosis not present

## 2016-10-08 DIAGNOSIS — G809 Cerebral palsy, unspecified: Secondary | ICD-10-CM | POA: Diagnosis not present

## 2016-10-12 DIAGNOSIS — G809 Cerebral palsy, unspecified: Secondary | ICD-10-CM | POA: Diagnosis not present

## 2016-10-12 DIAGNOSIS — G4089 Other seizures: Secondary | ICD-10-CM | POA: Diagnosis not present

## 2016-10-12 DIAGNOSIS — E119 Type 2 diabetes mellitus without complications: Secondary | ICD-10-CM | POA: Diagnosis not present

## 2016-10-12 DIAGNOSIS — R2689 Other abnormalities of gait and mobility: Secondary | ICD-10-CM | POA: Diagnosis not present

## 2016-10-12 DIAGNOSIS — M6281 Muscle weakness (generalized): Secondary | ICD-10-CM | POA: Diagnosis not present

## 2016-10-14 DIAGNOSIS — E119 Type 2 diabetes mellitus without complications: Secondary | ICD-10-CM | POA: Diagnosis not present

## 2016-10-14 DIAGNOSIS — M6281 Muscle weakness (generalized): Secondary | ICD-10-CM | POA: Diagnosis not present

## 2016-10-14 DIAGNOSIS — R2689 Other abnormalities of gait and mobility: Secondary | ICD-10-CM | POA: Diagnosis not present

## 2016-10-14 DIAGNOSIS — G809 Cerebral palsy, unspecified: Secondary | ICD-10-CM | POA: Diagnosis not present

## 2016-10-14 DIAGNOSIS — G4089 Other seizures: Secondary | ICD-10-CM | POA: Diagnosis not present

## 2016-10-19 DIAGNOSIS — R569 Unspecified convulsions: Secondary | ICD-10-CM | POA: Diagnosis not present

## 2016-10-19 DIAGNOSIS — M6281 Muscle weakness (generalized): Secondary | ICD-10-CM | POA: Diagnosis not present

## 2016-10-19 DIAGNOSIS — R2689 Other abnormalities of gait and mobility: Secondary | ICD-10-CM | POA: Diagnosis not present

## 2016-10-19 DIAGNOSIS — E119 Type 2 diabetes mellitus without complications: Secondary | ICD-10-CM | POA: Diagnosis not present

## 2016-10-19 DIAGNOSIS — E785 Hyperlipidemia, unspecified: Secondary | ICD-10-CM | POA: Diagnosis not present

## 2016-10-19 DIAGNOSIS — G809 Cerebral palsy, unspecified: Secondary | ICD-10-CM | POA: Diagnosis not present

## 2016-10-19 DIAGNOSIS — E1165 Type 2 diabetes mellitus with hyperglycemia: Secondary | ICD-10-CM | POA: Diagnosis not present

## 2016-10-19 DIAGNOSIS — G4089 Other seizures: Secondary | ICD-10-CM | POA: Diagnosis not present

## 2016-10-23 DIAGNOSIS — G809 Cerebral palsy, unspecified: Secondary | ICD-10-CM | POA: Diagnosis not present

## 2016-10-23 DIAGNOSIS — E119 Type 2 diabetes mellitus without complications: Secondary | ICD-10-CM | POA: Diagnosis not present

## 2016-10-23 DIAGNOSIS — G4089 Other seizures: Secondary | ICD-10-CM | POA: Diagnosis not present

## 2016-10-23 DIAGNOSIS — M6281 Muscle weakness (generalized): Secondary | ICD-10-CM | POA: Diagnosis not present

## 2016-10-23 DIAGNOSIS — R2689 Other abnormalities of gait and mobility: Secondary | ICD-10-CM | POA: Diagnosis not present

## 2016-10-26 DIAGNOSIS — G809 Cerebral palsy, unspecified: Secondary | ICD-10-CM | POA: Diagnosis not present

## 2016-10-26 DIAGNOSIS — R2689 Other abnormalities of gait and mobility: Secondary | ICD-10-CM | POA: Diagnosis not present

## 2016-10-26 DIAGNOSIS — M6281 Muscle weakness (generalized): Secondary | ICD-10-CM | POA: Diagnosis not present

## 2016-10-26 DIAGNOSIS — G4089 Other seizures: Secondary | ICD-10-CM | POA: Diagnosis not present

## 2016-10-26 DIAGNOSIS — E119 Type 2 diabetes mellitus without complications: Secondary | ICD-10-CM | POA: Diagnosis not present

## 2016-10-29 DIAGNOSIS — R2689 Other abnormalities of gait and mobility: Secondary | ICD-10-CM | POA: Diagnosis not present

## 2016-10-29 DIAGNOSIS — M6281 Muscle weakness (generalized): Secondary | ICD-10-CM | POA: Diagnosis not present

## 2016-10-29 DIAGNOSIS — G809 Cerebral palsy, unspecified: Secondary | ICD-10-CM | POA: Diagnosis not present

## 2016-10-29 DIAGNOSIS — G4089 Other seizures: Secondary | ICD-10-CM | POA: Diagnosis not present

## 2016-10-29 DIAGNOSIS — E119 Type 2 diabetes mellitus without complications: Secondary | ICD-10-CM | POA: Diagnosis not present

## 2016-10-30 ENCOUNTER — Encounter (HOSPITAL_COMMUNITY): Payer: Self-pay | Admitting: Emergency Medicine

## 2016-10-30 ENCOUNTER — Emergency Department (HOSPITAL_COMMUNITY)
Admission: EM | Admit: 2016-10-30 | Discharge: 2016-10-31 | Disposition: A | Discharge status: Home / Self Care | Payer: Medicare Other | Attending: Emergency Medicine | Admitting: Emergency Medicine

## 2016-10-30 ENCOUNTER — Emergency Department (HOSPITAL_COMMUNITY): Payer: Medicare Other

## 2016-10-30 DIAGNOSIS — R4781 Slurred speech: Secondary | ICD-10-CM | POA: Diagnosis not present

## 2016-10-30 DIAGNOSIS — I69992 Facial weakness following unspecified cerebrovascular disease: Secondary | ICD-10-CM | POA: Diagnosis not present

## 2016-10-30 DIAGNOSIS — I6789 Other cerebrovascular disease: Secondary | ICD-10-CM | POA: Diagnosis not present

## 2016-10-30 DIAGNOSIS — Z7984 Long term (current) use of oral hypoglycemic drugs: Secondary | ICD-10-CM | POA: Insufficient documentation

## 2016-10-30 DIAGNOSIS — R569 Unspecified convulsions: Secondary | ICD-10-CM | POA: Insufficient documentation

## 2016-10-30 DIAGNOSIS — Z79899 Other long term (current) drug therapy: Secondary | ICD-10-CM | POA: Diagnosis not present

## 2016-10-30 DIAGNOSIS — I639 Cerebral infarction, unspecified: Secondary | ICD-10-CM | POA: Diagnosis present

## 2016-10-30 DIAGNOSIS — R29818 Other symptoms and signs involving the nervous system: Secondary | ICD-10-CM | POA: Diagnosis not present

## 2016-10-30 DIAGNOSIS — Z8669 Personal history of other diseases of the nervous system and sense organs: Secondary | ICD-10-CM | POA: Diagnosis not present

## 2016-10-30 LAB — DIFFERENTIAL
Basophils Absolute: 0 10*3/uL (ref 0.0–0.1)
Basophils Relative: 0 %
Eosinophils Absolute: 0.1 10*3/uL (ref 0.0–0.7)
Eosinophils Relative: 3 %
Lymphocytes Relative: 51 %
Lymphs Abs: 2.1 10*3/uL (ref 0.7–4.0)
Monocytes Absolute: 0.4 10*3/uL (ref 0.1–1.0)
Monocytes Relative: 9 %
Neutro Abs: 1.5 10*3/uL — ABNORMAL LOW (ref 1.7–7.7)
Neutrophils Relative %: 37 %

## 2016-10-30 LAB — COMPREHENSIVE METABOLIC PANEL
ALT: 23 U/L (ref 17–63)
AST: 19 U/L (ref 15–41)
Albumin: 4.3 g/dL (ref 3.5–5.0)
Alkaline Phosphatase: 104 U/L (ref 38–126)
Anion gap: 9 (ref 5–15)
BUN: 13 mg/dL (ref 6–20)
CO2: 27 mmol/L (ref 22–32)
Calcium: 9.5 mg/dL (ref 8.9–10.3)
Chloride: 96 mmol/L — ABNORMAL LOW (ref 101–111)
Creatinine, Ser: 0.86 mg/dL (ref 0.61–1.24)
GFR calc Af Amer: >60 mL/min (ref >60)
GFR calc non Af Amer: >60 mL/min (ref >60)
Glucose, Bld: 144 mg/dL — ABNORMAL HIGH (ref 65–99)
Potassium: 4.4 mmol/L (ref 3.5–5.1)
Sodium: 132 mmol/L — ABNORMAL LOW (ref 135–145)
Total Bilirubin: 0.7 mg/dL (ref 0.3–1.2)
Total Protein: 7.8 g/dL (ref 6.5–8.1)

## 2016-10-30 LAB — TROPONIN I: Troponin I: <0.03 ng/mL (ref <0.03)

## 2016-10-30 LAB — CBC
HCT: 37.9 % — ABNORMAL LOW (ref 39.0–52.0)
Hemoglobin: 13.3 g/dL (ref 13.0–17.0)
MCH: 28.5 pg (ref 26.0–34.0)
MCHC: 35.1 g/dL (ref 30.0–36.0)
MCV: 81.2 fL (ref 78.0–100.0)
Platelets: 177 10*3/uL (ref 150–400)
RBC: 4.67 MIL/uL (ref 4.22–5.81)
RDW: 12.2 % (ref 11.5–15.5)
WBC: 4.2 10*3/uL (ref 4.0–10.5)

## 2016-10-30 LAB — PROTIME-INR
INR: 1
Prothrombin Time: 13.2 seconds (ref 11.4–15.2)

## 2016-10-30 LAB — APTT: aPTT: 28 seconds (ref 24–36)

## 2016-10-30 LAB — CBG MONITORING, ED: Glucose-Capillary: 137 mg/dL — ABNORMAL HIGH (ref 65–99)

## 2016-10-30 MED ORDER — CARBAMAZEPINE 200 MG PO TABS
300.0000 mg | ORAL_TABLET | Freq: Once | ORAL | Status: AC
  Administered 2016-10-30: 300 mg via ORAL
  Filled 2016-10-30: qty 2

## 2016-10-30 NOTE — ED Notes (Signed)
Soc neurologist cancelled code stroke along with dr Oleta Mouse at 2300.

## 2016-10-30 NOTE — ED Triage Notes (Signed)
Pt was getting a shower with caregivers from [facility] and they stated he started shaking all over and would not respond to them around 2215 tonight. When the med tech got to shower room pt was very slow to respond and would not make eye contact with them.

## 2016-10-31 DIAGNOSIS — R279 Unspecified lack of coordination: Secondary | ICD-10-CM | POA: Diagnosis not present

## 2016-10-31 DIAGNOSIS — Z743 Need for continuous supervision: Secondary | ICD-10-CM | POA: Diagnosis not present

## 2016-10-31 MED ORDER — CARBAMAZEPINE 200 MG PO TABS: 300.0000 mg | ORAL_TABLET | Freq: Two times a day (BID) | ORAL | 0 refills | Status: AC

## 2016-10-31 NOTE — ED Provider Notes (Signed)
[facility] DEPT Provider Note   CSN: [id number] Arrival date & time: 10/30/16  2236   An emergency department physician performed an initial assessment on this suspected stroke patient at 2235.  History   Chief Complaint Chief Complaint  Patient presents with  . Code Stroke    HPI Jeremy Parks is a 66 y.o. male.  The history is provided by the nursing home.   66 year old male who presents as a code stroke. as a code stroke. He has a history of cerebral palsy and seizure disorder on Tegretol. According to nursing facility, patient while getting out of the shower had generalized shaking, and afterwards was minimally responsive. They stated that he had slurred speech. EMS was called, and a code stroke was activated. They did state initially that he had difficulty moving the left side compared to the right. The patient does not recall what happened to him. He denies any complaints. Denies any chest pain, abdominal pain, difficulty breathing. Denies feeling ill.   Past Medical History:  Diagnosis Date  . Cerebral palsy ([place])   . Lipoma of abdominal wall   . Seizures ([place])     There are no active problems to display for this patient.   History reviewed. No pertinent surgical history.     Home Medications    Prior to Admission medications   Medication Sig Start Date End Date Taking? Authorizing Provider  carbamazepine (TEGRETOL) 200 MG tablet Take 1.5 tablets (300 mg total) by mouth 2 (two) times daily. 10/31/16   Forde Dandy, MD  chlorhexidine (PERIDEX) 0.12 % solution Use as directed 15 mLs in the mouth or throat daily. **Swish 15 mls for 30 seconds then spit out. USE DAILY**    [provider]  docusate sodium (COLACE) 100 MG capsule Take 100 mg by mouth 2 (two) times daily.    [provider]  nystatin (MYCOSTATIN/NYSTOP) powder Apply topically 4 (four) times daily. Apply topically to affected area of groin twice daily as needed for rash.    [provider]    SitaGLIPtin-MetFORMIN HCl (JANUMET XR) [phone] MG TB24 Take 1 tablet by mouth daily.    [provider]    Family History No family history on file.  Social History Social History  Substance Use Topics  . Smoking status: Never Smoker  . Smokeless tobacco: Never Used  . Alcohol use No     Allergies   Patient has no known allergies.   Review of Systems Review of Systems  Constitutional: Negative for fever.  Respiratory: Negative for shortness of breath.   Cardiovascular: Negative for chest pain.  Gastrointestinal: Negative for abdominal pain.  Hematological: Does not bruise/bleed easily.  All other systems reviewed and are negative.    Physical Exam Updated Vital Signs BP 125/86   Pulse 85   Temp 98.9 F (37.2 C)   Resp 12   SpO2 99%   Physical Exam Physical Exam  Nursing note and vitals reviewed. Constitutional: Well developed, well nourished, non-toxic, and in no acute distress Head: Normocephalic and atraumatic.  Mouth/Throat: Oropharynx is clear and moist.  Neck: Normal range of motion. Neck supple. no nuchal rigidity Cardiovascular: Normal rate and regular rhythm.   Pulmonary/Chest: Effort normal and breath sounds normal.  Abdominal: Soft. There is no tenderness. There is no rebound and no guarding.  Musculoskeletal: Normal range of motion.  Neurological: Alert, no facial droop, fluent speech, no pronator drift, sensation to light touch in tact throughout, (minimally moves lower extremities which is baseline), EOMI, PERRL  Skin: Skin is warm and dry.  Psychiatric: Cooperative   ED Treatments / Results  Labs (all labs ordered are listed, but only abnormal results are displayed) Labs Reviewed  CBC - Abnormal; Notable for the following:       Result Value   HCT 37.9 (*)    All other components within normal limits  DIFFERENTIAL - Abnormal; Notable for the following:    Neutro Abs 1.5 (*)    All other components within normal limits   COMPREHENSIVE METABOLIC PANEL - Abnormal; Notable for the following:    Sodium 132 (*)    Chloride 96 (*)    Glucose, Bld 144 (*)    All other components within normal limits  CBG MONITORING, ED - Abnormal; Notable for the following:    Glucose-Capillary 137 (*)    All other components within normal limits  PROTIME-INR  APTT  TROPONIN I    EKG  EKG Interpretation  Date/Time:  Friday October 30 2016 22:55:50 EDT Ventricular Rate:  92 PR Interval:    QRS Duration: 82 QT Interval:  352 QTC Calculation: 436 R Axis:   55 Text Interpretation:  Sinus rhythm Abnormal R-wave progression, early transition no acute changes  Confirmed by Brantley Stage [id number]) on 10/30/2016 11:40:31 PM       Radiology Ct Head Code Stroke W/o Cm  Result Date: 10/30/2016 CLINICAL DATA:  Code stroke.  Shaking all over. EXAM: CT HEAD WITHOUT CONTRAST TECHNIQUE: Contiguous axial images were obtained from the base of the skull through the vertex without intravenous contrast. COMPARISON:  None. FINDINGS: Brain: No evidence of acute infarction, hemorrhage, hydrocephalus, extra-axial collection or mass lesion/mass effect. Moderate brain parenchymal volume loss with prominent cerebellar volume loss. Nonspecific foci of hypoattenuation in white matter probably represent mild chronic microvascular ischemic changes Vascular: No hyperdense vessel or unexpected calcification. Skull: Normal. Negative for fracture or focal lesion. Sinuses/Orbits: No acute finding. Other: Left external ear opacification, probably cerumen. ASPECTS [facility] Stroke Program Early CT Score) - Ganglionic level infarction (caudate, lentiform nuclei, internal capsule, insula, M1-M3 cortex): 7 - Supraganglionic infarction (M4-M6 cortex): 3 Total score (0-10 with 10 being normal): 10 IMPRESSION: 1. No acute intracranial abnormality identified. 2. Moderate brain parenchymal volume loss. Prominent cerebellar volume loss can be seen with chronic seizure medication. 3.  ASPECTS is 10 These results were called by telephone at the time of interpretation on 10/30/2016 at 10:58 pm to Dr. Brantley Stage , who verbally acknowledged these results. Electronically Signed   By: Kristine Garbe M.D.   On: 10/30/2016 23:00    Procedures Procedures (including critical care time)  Medications Ordered in ED Medications  carbamazepine (TEGRETOL) tablet 300 mg (300 mg Oral Given 10/30/16 2312)     Initial Impression / Assessment and Plan / ED Course  I have reviewed the triage vital signs and the nursing notes.  Pertinent labs & imaging results that were available during my care of the patient were reviewed by me and considered in my medical decision making (see chart for details).     Patient presenting his code stroke, however on my evaluation patient appeared to have seizure with possible Todd's paralysis. On my evaluation, I he has no focal neurological deficits. Answering questions appropriately.  At baseline, he has difficulty moving his lower extremities but no new findings. CT head visualized and shows no acute intracranial processes. Was evaluated by the [place] neurologist, who felt that presentation is consistent with that of his seizure. He recommended increasing his dose  of Tegretol to 300 mg twice a day. A prescription is provided. Patient is observed, and has returned to baseline. Is felt to be stable for discharge back to his facility.  Final Clinical Impressions(s) / ED Diagnoses   Final diagnoses:  Seizure ([place])    New Prescriptions New Prescriptions   CARBAMAZEPINE (TEGRETOL) 200 MG TABLET    Take 1.5 tablets (300 mg total) by mouth 2 (two) times daily.     Forde Dandy, MD 10/31/16 [phone]

## 2016-10-31 NOTE — Discharge Instructions (Signed)
You had a seizure today.  Our neurologist recommend that you increase your tegretol to 300 mg two times daily.  Return for worsening symptoms, including confusion, recurrent seizures, or any other symptoms concerning to you.

## 2016-11-02 DIAGNOSIS — E119 Type 2 diabetes mellitus without complications: Secondary | ICD-10-CM | POA: Diagnosis not present

## 2016-11-02 DIAGNOSIS — G809 Cerebral palsy, unspecified: Secondary | ICD-10-CM | POA: Diagnosis not present

## 2016-11-02 DIAGNOSIS — G4089 Other seizures: Secondary | ICD-10-CM | POA: Diagnosis not present

## 2016-11-02 DIAGNOSIS — M6281 Muscle weakness (generalized): Secondary | ICD-10-CM | POA: Diagnosis not present

## 2016-11-02 DIAGNOSIS — R2689 Other abnormalities of gait and mobility: Secondary | ICD-10-CM | POA: Diagnosis not present

## 2017-02-26 DIAGNOSIS — Z23 Encounter for immunization: Secondary | ICD-10-CM | POA: Diagnosis not present

## 2017-03-10 DIAGNOSIS — R609 Edema, unspecified: Secondary | ICD-10-CM | POA: Diagnosis not present

## 2017-03-10 DIAGNOSIS — R569 Unspecified convulsions: Secondary | ICD-10-CM | POA: Diagnosis not present

## 2017-03-10 DIAGNOSIS — E1165 Type 2 diabetes mellitus with hyperglycemia: Secondary | ICD-10-CM | POA: Diagnosis not present

## 2017-03-10 DIAGNOSIS — G809 Cerebral palsy, unspecified: Secondary | ICD-10-CM | POA: Diagnosis not present

## 2017-03-18 DIAGNOSIS — H25813 Combined forms of age-related cataract, bilateral: Secondary | ICD-10-CM | POA: Diagnosis not present

## 2017-03-18 DIAGNOSIS — Z794 Long term (current) use of insulin: Secondary | ICD-10-CM | POA: Diagnosis not present

## 2017-03-18 DIAGNOSIS — E119 Type 2 diabetes mellitus without complications: Secondary | ICD-10-CM | POA: Diagnosis not present

## 2017-04-05 DIAGNOSIS — M79674 Pain in right toe(s): Secondary | ICD-10-CM | POA: Diagnosis not present

## 2017-04-05 DIAGNOSIS — M79675 Pain in left toe(s): Secondary | ICD-10-CM | POA: Diagnosis not present

## 2017-04-05 DIAGNOSIS — B351 Tinea unguium: Secondary | ICD-10-CM | POA: Diagnosis not present

## 2017-06-08 DIAGNOSIS — R569 Unspecified convulsions: Secondary | ICD-10-CM | POA: Diagnosis not present

## 2017-06-22 DIAGNOSIS — B351 Tinea unguium: Secondary | ICD-10-CM | POA: Diagnosis not present

## 2017-06-22 DIAGNOSIS — M79674 Pain in right toe(s): Secondary | ICD-10-CM | POA: Diagnosis not present

## 2017-06-22 DIAGNOSIS — M79675 Pain in left toe(s): Secondary | ICD-10-CM | POA: Diagnosis not present

## 2017-06-29 DIAGNOSIS — H25813 Combined forms of age-related cataract, bilateral: Secondary | ICD-10-CM | POA: Diagnosis not present

## 2017-06-29 DIAGNOSIS — E109 Type 1 diabetes mellitus without complications: Secondary | ICD-10-CM | POA: Diagnosis not present

## 2017-06-29 DIAGNOSIS — Z794 Long term (current) use of insulin: Secondary | ICD-10-CM | POA: Diagnosis not present

## 2017-07-17 ENCOUNTER — Encounter (HOSPITAL_COMMUNITY): Payer: Self-pay | Admitting: Emergency Medicine

## 2017-07-17 ENCOUNTER — Emergency Department (HOSPITAL_COMMUNITY): Payer: Medicare Other

## 2017-07-17 ENCOUNTER — Emergency Department (HOSPITAL_COMMUNITY)
Admission: EM | Admit: 2017-07-17 | Discharge: 2017-07-18 | Disposition: A | Discharge status: Home / Self Care | Payer: Medicare Other | Attending: Emergency Medicine | Admitting: Emergency Medicine

## 2017-07-17 ENCOUNTER — Other Ambulatory Visit: Payer: Self-pay

## 2017-07-17 DIAGNOSIS — R1110 Vomiting, unspecified: Secondary | ICD-10-CM | POA: Diagnosis present

## 2017-07-17 DIAGNOSIS — G43A0 Cyclical vomiting, not intractable: Secondary | ICD-10-CM | POA: Insufficient documentation

## 2017-07-17 DIAGNOSIS — R509 Fever, unspecified: Secondary | ICD-10-CM | POA: Diagnosis not present

## 2017-07-17 DIAGNOSIS — R197 Diarrhea, unspecified: Secondary | ICD-10-CM | POA: Diagnosis not present

## 2017-07-17 DIAGNOSIS — J9811 Atelectasis: Secondary | ICD-10-CM | POA: Diagnosis not present

## 2017-07-17 DIAGNOSIS — G43A1 Cyclical vomiting, intractable: Secondary | ICD-10-CM | POA: Diagnosis not present

## 2017-07-17 DIAGNOSIS — R110 Nausea: Secondary | ICD-10-CM | POA: Diagnosis not present

## 2017-07-17 DIAGNOSIS — R1115 Cyclical vomiting syndrome unrelated to migraine: Secondary | ICD-10-CM

## 2017-07-17 DIAGNOSIS — R269 Unspecified abnormalities of gait and mobility: Secondary | ICD-10-CM | POA: Diagnosis not present

## 2017-07-17 DIAGNOSIS — R52 Pain, unspecified: Secondary | ICD-10-CM | POA: Diagnosis not present

## 2017-07-17 MED ORDER — ONDANSETRON HCL 4 MG/2ML IJ SOLN
4.0000 mg | Freq: Once | INTRAMUSCULAR | Status: AC
  Administered 2017-07-18: 4 mg via INTRAVENOUS
  Filled 2017-07-17: qty 2

## 2017-07-17 MED ORDER — SODIUM CHLORIDE 0.9 % IV BOLUS (SEPSIS)
1000.0000 mL | Freq: Once | INTRAVENOUS | Status: AC
  Administered 2017-07-18: 1000 mL via INTRAVENOUS

## 2017-07-17 NOTE — ED Triage Notes (Signed)
Pt had 4 episodes of coffee ground emesis tonight.

## 2017-07-17 NOTE — ED Provider Notes (Signed)
[facility] EMERGENCY DEPARTMENT Provider Note   CSN: [id number] Arrival date & time: 07/17/17  2310     History   Chief Complaint Chief Complaint  Patient presents with  . Emesis  Level 5 caveat due to poor historian  HPI Jeremy Parks is a 67 y.o. male.  The history is provided by the patient and the nursing home.  Emesis   This is a new problem. Episode onset: Earlier tonight. The problem has been gradually worsening. There has been no fever. Associated symptoms include abdominal pain.  he presents from nursing facility.  Patient has a history of cerebral palsy and seizures Patient reports he began vomiting early in the night.  He denies any pain at this time.  He also reports diarrhea.  I spoke to the nursing staff at [facility]. They report he was found in his room vomiting with dark emesis noted.  No diarrhea has been reported.  At that time he had reported abdominal pain.  He is otherwise been well recently.  No new illnesses recently.  No sick exposures. Patient is nonambulatory at baseline Per nursing staff, when asked if he is able to provide any history they report "yes and no " Past Medical History:  Diagnosis Date  . Cerebral palsy ([place])   . Lipoma of abdominal wall   . Seizures ([place])     There are no active problems to display for this patient.   History reviewed. No pertinent surgical history.     Home Medications    Prior to Admission medications   Medication Sig Start Date End Date Taking? Authorizing Provider  atorvastatin (LIPITOR) 40 MG tablet Take 40 mg by mouth daily.   Yes [provider]  carbamazepine (TEGRETOL) 200 MG tablet Take 1.5 tablets (300 mg total) by mouth 2 (two) times daily. 10/31/16  Yes Forde Dandy, MD  chlorhexidine (PERIDEX) 0.12 % solution Use as directed 15 mLs in the mouth or throat daily. **Swish 15 mls for 30 seconds then spit out. USE DAILY**   Yes [provider]  docusate sodium (COLACE) 100 MG  capsule Take 100 mg by mouth 2 (two) times daily.   Yes [provider]  Insulin Glargine (BASAGLAR KWIKPEN [place]) Inject 20 Units into the skin at bedtime.   Yes [provider]  Insulin Pen Needle (NOVOFINE) 30G X 8 MM MISC Inject 1 packet into the skin as needed.   Yes [provider]  nystatin (MYCOSTATIN/NYSTOP) powder Apply topically 4 (four) times daily. Apply topically to affected area of groin twice daily as needed for rash.   Yes [provider]  SitaGLIPtin-MetFORMIN HCl (JANUMET XR) [phone] MG TB24 Take 1 tablet by mouth daily.    [provider]    Family History No family history on file.  Social History Social History   Tobacco Use  . Smoking status: Never Smoker  . Smokeless tobacco: Never Used  Substance Use Topics  . Alcohol use: No  . Drug use: No     Allergies   Patient has no known allergies.   Review of Systems Review of Systems  Unable to perform ROS: Other  Gastrointestinal: Positive for abdominal pain and vomiting.     Physical Exam Updated Vital Signs BP 135/65   Pulse 90   Temp 98.1 F (36.7 C) (Oral)   Resp 18   Wt 99.8 kg (220 lb)   SpO2 100%   BMI 31.57 kg/m   Physical Exam CONSTITUTIONAL: Elderly  and frail HEAD: Normocephalic/atraumatic EYES: EOMI/PERRL ENMT: Mucous membranes dry NECK: supple no meningeal signs SPINE/BACK:entire spine nontender CV: S1/S2 noted, no murmurs/rubs/gallops noted LUNGS: Lungs are clear to auscultation bilaterally, no apparent distress ABDOMEN: soft, nontender, no rebound or guarding, decreased bowel sounds noted  NEURO: Pt is awake/alert moves all extremitiesx4.  EXTREMITIES: pulses normal/equal, full ROM SKIN: warm, color normal PSYCH: Flat affect  ED Treatments / Results  Labs (all labs ordered are listed, but only abnormal results are displayed) Labs Reviewed  COMPREHENSIVE METABOLIC PANEL - Abnormal; Notable for the following components:      Result  Value   Sodium 134 (*)    Chloride 99 (*)    Glucose, Bld 111 (*)    Total Protein 8.3 (*)    Alkaline Phosphatase 127 (*)    All other components within normal limits  CBC WITH DIFFERENTIAL/PLATELET - Abnormal; Notable for the following components:   Hemoglobin 12.8 (*)    All other components within normal limits  URINALYSIS, ROUTINE W REFLEX MICROSCOPIC - Abnormal; Notable for the following components:   Protein, ur 30 (*)    Squamous Epithelial / LPF 0-5 (*)    All other components within normal limits  LIPASE, BLOOD  LACTIC ACID, PLASMA  POC OCCULT BLOOD, ED  TYPE AND SCREEN    EKG  EKG Interpretation  Date/Time:  Sunday July 18 2017 00:37:06 EDT Ventricular Rate:  86 PR Interval:    QRS Duration: 87 QT Interval:  361 QTC Calculation: 432 R Axis:   27 Text Interpretation:  Sinus rhythm Low voltage, precordial leads Posterior infarct, old No significant change since last tracing Confirmed by Andrw Mcguirt ([id number]) on 07/18/2017 12:59:22 AM       Radiology Dg Abdomen Acute W/chest  Result Date: 07/18/2017 CLINICAL DATA:  Abdominal pain.  Vomiting. EXAM: DG ABDOMEN ACUTE W/ 1V CHEST COMPARISON:  CT 07/25/2014 FINDINGS: Low lung volumes with bibasilar atelectasis. Heart size normal for technique. No evidence of free air. Mild gaseous distention of colon in the left abdomen. Moderate stool in the remainder the colon. No abnormal rectal distention. No small bowel dilatation. No radiopaque calculi. There multiple pelvic phleboliths. No acute osseous abnormalities are seen. IMPRESSION: Mild gaseous distention of descending colon with moderate stool burden proximally. No evidence of obstruction or free air. Low lung volumes with bibasilar atelectasis Electronically Signed   By: Melanie  Ehinger M.D.   On: 07/18/2017 01:06    Procedures Procedures    Medications Ordered in ED Medications  sodium chloride 0.9 % bolus 1,000 mL (0 mLs Intravenous Stopped 07/18/17 0144)    ondansetron (ZOFRAN) injection 4 mg (4 mg Intravenous Given 07/18/17 0032)  acetaminophen (TYLENOL) tablet 650 mg (650 mg Oral Given 07/18/17 0200)     Initial Impression / Assessment and Plan / ED Course  I have reviewed the triage vital signs and the nursing notes.  Pertinent labs & imaging results that were available during my care of the patient were reviewed by me and considered in my medical decision making (see chart for details).     11 :32 PM Patient poor historian at baseline.  He presents from nursing facility.  Vitals are appropriate, will need imaging and labs 1:35 AM Stable.  He continues to deny abdominal pain.  No focal abdominal tenderness.  X-ray imaging is negative. Labs thus far unremarkable. Will check urine  3:25 AM Extensive workup has been unrevealing.  X-ray reviewed and negative.  No UTI. No elevated WBC, lactate negative. Multiple  re-exams, no focal abdominal tenderness.  He is taking p.o. Fluids. At this point, I feel he is appropriate for discharge.  I do not feel emergent imaging is required of his abdomen. Patient is awake and alert, no distress.  He denies any complaints.  He is answering questions appropriately.  Call was placed to [facility] as they are his legal guardian Discharge back to [facility] facility Final Clinical Impressions(s) / ED Diagnoses   Final diagnoses:  Acute febrile illness  Non-intractable cyclical vomiting with nausea    ED Discharge Orders    None       Ripley Fraise, MD 07/18/17 [phone]

## 2017-07-18 DIAGNOSIS — R110 Nausea: Secondary | ICD-10-CM | POA: Diagnosis not present

## 2017-07-18 DIAGNOSIS — G43A1 Cyclical vomiting, intractable: Secondary | ICD-10-CM | POA: Diagnosis not present

## 2017-07-18 DIAGNOSIS — R279 Unspecified lack of coordination: Secondary | ICD-10-CM | POA: Diagnosis not present

## 2017-07-18 DIAGNOSIS — J9811 Atelectasis: Secondary | ICD-10-CM | POA: Diagnosis not present

## 2017-07-18 DIAGNOSIS — G43A0 Cyclical vomiting, not intractable: Secondary | ICD-10-CM | POA: Diagnosis not present

## 2017-07-18 DIAGNOSIS — Z7401 Bed confinement status: Secondary | ICD-10-CM | POA: Diagnosis not present

## 2017-07-18 DIAGNOSIS — R509 Fever, unspecified: Secondary | ICD-10-CM | POA: Diagnosis not present

## 2017-07-18 LAB — URINALYSIS, ROUTINE W REFLEX MICROSCOPIC
Bacteria, UA: NONE SEEN
Bilirubin Urine: NEGATIVE
Glucose, UA: NEGATIVE mg/dL
Hgb urine dipstick: NEGATIVE
Ketones, ur: NEGATIVE mg/dL
Leukocytes, UA: NEGATIVE
Nitrite: NEGATIVE
Protein, ur: 30 mg/dL — AB
Specific Gravity, Urine: 1.019 (ref 1.005–1.030)
pH: 6 (ref 5.0–8.0)

## 2017-07-18 LAB — CBC WITH DIFFERENTIAL/PLATELET
Basophils Absolute: 0 10*3/uL (ref 0.0–0.1)
Basophils Relative: 0 %
Eosinophils Absolute: 0.2 10*3/uL (ref 0.0–0.7)
Eosinophils Relative: 4 %
HCT: 39.3 % (ref 39.0–52.0)
Hemoglobin: 12.8 g/dL — ABNORMAL LOW (ref 13.0–17.0)
Lymphocytes Relative: 27 %
Lymphs Abs: 1.5 10*3/uL (ref 0.7–4.0)
MCH: 27.6 pg (ref 26.0–34.0)
MCHC: 32.6 g/dL (ref 30.0–36.0)
MCV: 84.7 fL (ref 78.0–100.0)
Monocytes Absolute: 0.6 10*3/uL (ref 0.1–1.0)
Monocytes Relative: 10 %
Neutro Abs: 3.2 10*3/uL (ref 1.7–7.7)
Neutrophils Relative %: 59 %
Platelets: 188 10*3/uL (ref 150–400)
RBC: 4.64 MIL/uL (ref 4.22–5.81)
RDW: 12.7 % (ref 11.5–15.5)
WBC: 5.4 10*3/uL (ref 4.0–10.5)

## 2017-07-18 LAB — COMPREHENSIVE METABOLIC PANEL
ALT: 31 U/L (ref 17–63)
AST: 26 U/L (ref 15–41)
Albumin: 4.4 g/dL (ref 3.5–5.0)
Alkaline Phosphatase: 127 U/L — ABNORMAL HIGH (ref 38–126)
Anion gap: 11 (ref 5–15)
BUN: 16 mg/dL (ref 6–20)
CO2: 24 mmol/L (ref 22–32)
Calcium: 9.4 mg/dL (ref 8.9–10.3)
Chloride: 99 mmol/L — ABNORMAL LOW (ref 101–111)
Creatinine, Ser: 0.82 mg/dL (ref 0.61–1.24)
GFR calc Af Amer: >60 mL/min (ref >60)
GFR calc non Af Amer: >60 mL/min (ref >60)
Glucose, Bld: 111 mg/dL — ABNORMAL HIGH (ref 65–99)
Potassium: 4.4 mmol/L (ref 3.5–5.1)
Sodium: 134 mmol/L — ABNORMAL LOW (ref 135–145)
Total Bilirubin: 0.8 mg/dL (ref 0.3–1.2)
Total Protein: 8.3 g/dL — ABNORMAL HIGH (ref 6.5–8.1)

## 2017-07-18 LAB — LIPASE, BLOOD: Lipase: 28 U/L (ref 11–51)

## 2017-07-18 LAB — TYPE AND SCREEN
ABO/RH(D): A POS
Antibody Screen: NEGATIVE

## 2017-07-18 LAB — LACTIC ACID, PLASMA: Lactic Acid, Venous: 0.9 mmol/L (ref 0.5–1.9)

## 2017-07-18 LAB — POC OCCULT BLOOD, ED: Fecal Occult Bld: NEGATIVE

## 2017-07-18 MED ORDER — ACETAMINOPHEN 325 MG PO TABS
650.0000 mg | ORAL_TABLET | Freq: Once | ORAL | Status: AC
  Administered 2017-07-18: 650 mg via ORAL
  Filled 2017-07-18: qty 2

## 2017-07-18 NOTE — Discharge Instructions (Signed)
°  SEEK IMMEDIATE MEDICAL ATTENTION IF: The pain does not go away or becomes severe, particularly over the next 8-12 hours.  Repeated vomiting occurs (multiple episodes).  The pain becomes localized to portions of the abdomen. The right side could possibly be appendicitis. In an adult, the left lower portion of the abdomen could be colitis or diverticulitis.  Blood is being passed in stools or vomit (bright red or black tarry stools).  Return also if you develop chest pain, difficulty breathing, dizziness or fainting, or become confused, poorly responsive, or inconsolable.

## 2017-08-19 ENCOUNTER — Emergency Department (HOSPITAL_COMMUNITY): Payer: Medicare Other

## 2017-08-19 ENCOUNTER — Emergency Department (HOSPITAL_COMMUNITY)
Admission: EM | Admit: 2017-08-19 | Discharge: 2017-08-19 | Disposition: A | Discharge status: Home / Self Care | Payer: Medicare Other | Attending: Emergency Medicine | Admitting: Emergency Medicine

## 2017-08-19 ENCOUNTER — Other Ambulatory Visit: Payer: Self-pay

## 2017-08-19 ENCOUNTER — Encounter (HOSPITAL_COMMUNITY): Payer: Self-pay

## 2017-08-19 DIAGNOSIS — R269 Unspecified abnormalities of gait and mobility: Secondary | ICD-10-CM | POA: Diagnosis not present

## 2017-08-19 DIAGNOSIS — R4182 Altered mental status, unspecified: Secondary | ICD-10-CM | POA: Diagnosis not present

## 2017-08-19 DIAGNOSIS — Z86018 Personal history of other benign neoplasm: Secondary | ICD-10-CM | POA: Insufficient documentation

## 2017-08-19 DIAGNOSIS — Z79899 Other long term (current) drug therapy: Secondary | ICD-10-CM | POA: Insufficient documentation

## 2017-08-19 DIAGNOSIS — Z794 Long term (current) use of insulin: Secondary | ICD-10-CM | POA: Insufficient documentation

## 2017-08-19 DIAGNOSIS — G809 Cerebral palsy, unspecified: Secondary | ICD-10-CM | POA: Insufficient documentation

## 2017-08-19 DIAGNOSIS — R531 Weakness: Secondary | ICD-10-CM | POA: Diagnosis not present

## 2017-08-19 DIAGNOSIS — R5383 Other fatigue: Secondary | ICD-10-CM | POA: Insufficient documentation

## 2017-08-19 DIAGNOSIS — I6789 Other cerebrovascular disease: Secondary | ICD-10-CM | POA: Diagnosis not present

## 2017-08-19 DIAGNOSIS — E119 Type 2 diabetes mellitus without complications: Secondary | ICD-10-CM | POA: Insufficient documentation

## 2017-08-19 HISTORY — DX: Type 2 diabetes mellitus without complications: E11.9

## 2017-08-19 LAB — URINALYSIS, ROUTINE W REFLEX MICROSCOPIC
Bilirubin Urine: NEGATIVE
Glucose, UA: NEGATIVE mg/dL
Hgb urine dipstick: NEGATIVE
Ketones, ur: NEGATIVE mg/dL
Leukocytes, UA: NEGATIVE
Nitrite: NEGATIVE
Protein, ur: NEGATIVE mg/dL
Specific Gravity, Urine: 1.015 (ref 1.005–1.030)
pH: 5 (ref 5.0–8.0)

## 2017-08-19 LAB — CBC WITH DIFFERENTIAL/PLATELET
Basophils Absolute: 0 10*3/uL (ref 0.0–0.1)
Basophils Relative: 0 %
Eosinophils Absolute: 0.3 10*3/uL (ref 0.0–0.7)
Eosinophils Relative: 6 %
HCT: 39.7 % (ref 39.0–52.0)
Hemoglobin: 12.9 g/dL — ABNORMAL LOW (ref 13.0–17.0)
Lymphocytes Relative: 41 %
Lymphs Abs: 1.8 10*3/uL (ref 0.7–4.0)
MCH: 27.7 pg (ref 26.0–34.0)
MCHC: 32.5 g/dL (ref 30.0–36.0)
MCV: 85.4 fL (ref 78.0–100.0)
Monocytes Absolute: 0.4 10*3/uL (ref 0.1–1.0)
Monocytes Relative: 10 %
Neutro Abs: 1.9 10*3/uL (ref 1.7–7.7)
Neutrophils Relative %: 43 %
Platelets: 172 10*3/uL (ref 150–400)
RBC: 4.65 MIL/uL (ref 4.22–5.81)
RDW: 12.9 % (ref 11.5–15.5)
WBC: 4.3 10*3/uL (ref 4.0–10.5)

## 2017-08-19 LAB — COMPREHENSIVE METABOLIC PANEL
ALT: 28 U/L (ref 17–63)
AST: 23 U/L (ref 15–41)
Albumin: 4.2 g/dL (ref 3.5–5.0)
Alkaline Phosphatase: 116 U/L (ref 38–126)
Anion gap: 11 (ref 5–15)
BUN: 14 mg/dL (ref 6–20)
CO2: 24 mmol/L (ref 22–32)
Calcium: 9 mg/dL (ref 8.9–10.3)
Chloride: 102 mmol/L (ref 101–111)
Creatinine, Ser: 0.8 mg/dL (ref 0.61–1.24)
GFR calc Af Amer: >60 mL/min (ref >60)
GFR calc non Af Amer: >60 mL/min (ref >60)
Glucose, Bld: 124 mg/dL — ABNORMAL HIGH (ref 65–99)
Potassium: 4 mmol/L (ref 3.5–5.1)
Sodium: 137 mmol/L (ref 135–145)
Total Bilirubin: 0.6 mg/dL (ref 0.3–1.2)
Total Protein: 7.9 g/dL (ref 6.5–8.1)

## 2017-08-19 LAB — CBG MONITORING, ED: Glucose-Capillary: 140 mg/dL — ABNORMAL HIGH (ref 65–99)

## 2017-08-19 LAB — CARBAMAZEPINE LEVEL, TOTAL: Carbamazepine Lvl: 9.8 ug/mL (ref 4.0–12.0)

## 2017-08-19 NOTE — ED Triage Notes (Addendum)
Pt sent from [facility] due to altered mental status. Staff reports that he is normally A and O x4, but now only oriented person. Eyes were moving back and forth. Pt has hx of seizures last seizure July 2018. Pt has hx of cerebral plasy. CBG 146. When EMS arrived pt was on toilet and able to stand and pivot. Pt states he "fell out for a little bit"

## 2017-08-19 NOTE — Discharge Instructions (Addendum)
Follow-up with your family doctor next week if not improved

## 2017-08-19 NOTE — ED Notes (Signed)
cbg 140 

## 2017-08-19 NOTE — ED Provider Notes (Signed)
[facility] EMERGENCY DEPARTMENT Provider Note   CSN: [id number] Arrival date & time: 08/19/17  1018     History   Chief Complaint Chief Complaint  Patient presents with  . Altered Mental Status    HPI JOSEFF LUCKMAN is a 67 y.o. male.  Patient was brought from the group home because he was more sleepy than usual.  Patient has a history of seizures.  No one saw him have a seizure today.  Patient also has a history of cerebral palsy and diabetes  The history is provided by the patient and the nursing home. No language interpreter was used.  Altered Mental Status   This is a new problem. The current episode started 1 to 2 hours ago. The problem has been gradually improving. Pertinent negatives include no seizures, no unresponsiveness and no hallucinations. Risk factors: Seizures. His past medical history is significant for seizures.    Past Medical History:  Diagnosis Date  . Cerebral palsy ([place])   . Diabetes mellitus without complication ([place])   . Lipoma of abdominal wall   . Seizures ([place])     There are no active problems to display for this patient.   History reviewed. No pertinent surgical history.      Home Medications    Prior to Admission medications   Medication Sig Start Date End Date Taking? Authorizing Provider  atorvastatin (LIPITOR) 40 MG tablet Take 40 mg by mouth daily.   Yes [provider]  carbamazepine (TEGRETOL) 200 MG tablet Take 1.5 tablets (300 mg total) by mouth 2 (two) times daily. 10/31/16  Yes Forde Dandy, MD  chlorhexidine (PERIDEX) 0.12 % solution Use as directed 15 mLs in the mouth or throat daily. **Swish 15 mls for 30 seconds then spit out. USE DAILY**   Yes [provider]  docusate sodium (COLACE) 100 MG capsule Take 100 mg by mouth 2 (two) times daily.   Yes [provider]  Insulin Glargine (BASAGLAR KWIKPEN [place]) Inject 20 Units into the skin at bedtime.   Yes [provider]  Insulin Pen Needle  (NOVOFINE) 30G X 8 MM MISC Inject 1 packet into the skin as needed.   Yes [provider]  nystatin (MYCOSTATIN/NYSTOP) powder Apply topically 4 (four) times daily. Apply topically to affected area of groin twice daily as needed for rash.    [provider]    Family History No family history on file.  Social History Social History   Tobacco Use  . Smoking status: Never Smoker  . Smokeless tobacco: Never Used  Substance Use Topics  . Alcohol use: No  . Drug use: No     Allergies   Patient has no known allergies.   Review of Systems Review of Systems  Constitutional: Negative for appetite change and fatigue.  HENT: Negative for congestion, ear discharge and sinus pressure.   Eyes: Negative for discharge.  Respiratory: Negative for cough.   Cardiovascular: Negative for chest pain.  Gastrointestinal: Negative for abdominal pain and diarrhea.  Genitourinary: Negative for frequency and hematuria.  Musculoskeletal: Negative for back pain.  Skin: Negative for rash.  Neurological: Negative for seizures and headaches.  Psychiatric/Behavioral: Negative for hallucinations.     Physical Exam Updated Vital Signs BP 100/69 (BP Location: Left Arm)   Pulse 69   Temp 98.1 F (36.7 C) (Oral)   Resp 14   Wt 99.8 kg (220 lb)   SpO2 98%   BMI 31.57 kg/m   Physical Exam  Constitutional: He is oriented to person, place, and time. He appears well-developed.  HENT:  Head: Normocephalic.  Eyes: Conjunctivae and EOM are normal. No scleral icterus.  Neck: Neck supple. No thyromegaly present.  Cardiovascular: Normal rate and regular rhythm. Exam reveals no gallop and no friction rub.  No murmur heard. Pulmonary/Chest: No stridor. He has no wheezes. He has no rales. He exhibits no tenderness.  Abdominal: He exhibits no distension. There is no tenderness. There is no rebound.  Musculoskeletal: Normal range of motion. He exhibits no edema.  Lymphadenopathy:    He has  no cervical adenopathy.  Neurological: He is oriented to person, place, and time. He exhibits normal muscle tone. Coordination normal.  Minimal lethargy  Skin: No rash noted. No erythema.  Psychiatric: He has a normal mood and affect. His behavior is normal.     ED Treatments / Results  Labs (all labs ordered are listed, but only abnormal results are displayed) Labs Reviewed  CBC WITH DIFFERENTIAL/PLATELET - Abnormal; Notable for the following components:      Result Value   Hemoglobin 12.9 (*)    All other components within normal limits  COMPREHENSIVE METABOLIC PANEL - Abnormal; Notable for the following components:   Glucose, Bld 124 (*)    All other components within normal limits  CBG MONITORING, ED - Abnormal; Notable for the following components:   Glucose-Capillary 140 (*)    All other components within normal limits  CARBAMAZEPINE LEVEL, TOTAL  URINALYSIS, ROUTINE W REFLEX MICROSCOPIC    EKG None  Radiology Ct Head Wo Contrast  Result Date: 08/19/2017 CLINICAL DATA:  Altered mental status EXAM: CT HEAD WITHOUT CONTRAST TECHNIQUE: Contiguous axial images were obtained from the base of the skull through the vertex without intravenous contrast. COMPARISON:  10/30/2016 FINDINGS: Brain: Diffuse cerebral and cerebellar atrophy is identified. The cerebellar atrophy is appears slightly more marked. No findings to suggest acute hemorrhage, acute infarction or space occupying mass are noted. Vascular: No hyperdense vessel or unexpected calcification. Skull: Normal. Negative for fracture or focal lesion. Sinuses/Orbits: No acute finding. Other: None. IMPRESSION: Atrophic changes without acute abnormality. The overall appearance is stable from the prior exam. Electronically Signed   By: Inez Catalina M.D.   On: 08/19/2017 11:52   Dg Chest Portable 1 View  Result Date: 08/19/2017 CLINICAL DATA:  Weakness EXAM: PORTABLE CHEST 1 VIEW COMPARISON:  07/17/2017 FINDINGS: Cardiac shadow is  mildly enlarged but stable. The lungs are well aerated bilaterally. Mild bibasilar atelectatic changes are noted right greater than left slightly increased from the prior exam. No bony abnormality is noted. IMPRESSION: Bibasilar changes as described. Electronically Signed   By: Inez Catalina M.D.   On: 08/19/2017 11:16    Procedures Procedures (including critical care time)  Medications Ordered in ED Medications - No data to display   Initial Impression / Assessment and Plan / ED Course  I have reviewed the triage vital signs and the nursing notes.  Pertinent labs & imaging results that were available during my care of the patient were reviewed by me and considered in my medical decision making (see chart for details).    Patient with history of seizures and cerebral palsy.  He was more sleepy than usual and was sent over from his home to be evaluated.  Patient has diabetes and cerebral palsy.  Labs including CBC chemistry Tegretol level were unremarkable along with a CT of his head and EKG and chest x-ray.  Patient improved while he was  in the emergency department.  It is possible he had a seizure that no one saw.  He will be discharged back to the group home to follow-up with his primary care doctor  Final Clinical Impressions(s) / ED Diagnoses   Final diagnoses:  None    ED Discharge Orders    None       Milton Ferguson, MD 08/19/17 1415

## 2017-08-24 DIAGNOSIS — B351 Tinea unguium: Secondary | ICD-10-CM | POA: Diagnosis not present

## 2017-08-24 DIAGNOSIS — M79675 Pain in left toe(s): Secondary | ICD-10-CM | POA: Diagnosis not present

## 2017-08-26 DIAGNOSIS — G809 Cerebral palsy, unspecified: Secondary | ICD-10-CM | POA: Diagnosis not present

## 2017-08-26 DIAGNOSIS — E119 Type 2 diabetes mellitus without complications: Secondary | ICD-10-CM | POA: Diagnosis not present

## 2017-08-26 DIAGNOSIS — R569 Unspecified convulsions: Secondary | ICD-10-CM | POA: Diagnosis not present

## 2017-08-26 DIAGNOSIS — E785 Hyperlipidemia, unspecified: Secondary | ICD-10-CM | POA: Diagnosis not present

## 2017-08-31 DIAGNOSIS — E119 Type 2 diabetes mellitus without complications: Secondary | ICD-10-CM | POA: Diagnosis not present

## 2017-08-31 DIAGNOSIS — E785 Hyperlipidemia, unspecified: Secondary | ICD-10-CM | POA: Diagnosis not present

## 2017-08-31 DIAGNOSIS — G809 Cerebral palsy, unspecified: Secondary | ICD-10-CM | POA: Diagnosis not present

## 2017-08-31 DIAGNOSIS — R569 Unspecified convulsions: Secondary | ICD-10-CM | POA: Diagnosis not present

## 2017-09-23 DIAGNOSIS — Z794 Long term (current) use of insulin: Secondary | ICD-10-CM | POA: Diagnosis not present

## 2017-09-23 DIAGNOSIS — E119 Type 2 diabetes mellitus without complications: Secondary | ICD-10-CM | POA: Diagnosis not present

## 2017-09-23 DIAGNOSIS — H25813 Combined forms of age-related cataract, bilateral: Secondary | ICD-10-CM | POA: Diagnosis not present

## 2017-09-23 DIAGNOSIS — H524 Presbyopia: Secondary | ICD-10-CM | POA: Diagnosis not present

## 2017-10-18 DIAGNOSIS — H524 Presbyopia: Secondary | ICD-10-CM | POA: Diagnosis not present

## 2017-10-26 DIAGNOSIS — M79675 Pain in left toe(s): Secondary | ICD-10-CM | POA: Diagnosis not present

## 2017-10-26 DIAGNOSIS — M79674 Pain in right toe(s): Secondary | ICD-10-CM | POA: Diagnosis not present

## 2017-10-26 DIAGNOSIS — B351 Tinea unguium: Secondary | ICD-10-CM | POA: Diagnosis not present

## 2017-11-18 DIAGNOSIS — E119 Type 2 diabetes mellitus without complications: Secondary | ICD-10-CM | POA: Diagnosis not present

## 2017-11-18 DIAGNOSIS — R2689 Other abnormalities of gait and mobility: Secondary | ICD-10-CM | POA: Diagnosis not present

## 2017-11-18 DIAGNOSIS — G40909 Epilepsy, unspecified, not intractable, without status epilepticus: Secondary | ICD-10-CM | POA: Diagnosis not present

## 2017-11-18 DIAGNOSIS — G809 Cerebral palsy, unspecified: Secondary | ICD-10-CM | POA: Diagnosis not present

## 2017-11-18 DIAGNOSIS — Z794 Long term (current) use of insulin: Secondary | ICD-10-CM | POA: Diagnosis not present

## 2017-11-24 DIAGNOSIS — G809 Cerebral palsy, unspecified: Secondary | ICD-10-CM | POA: Diagnosis not present

## 2017-11-24 DIAGNOSIS — E119 Type 2 diabetes mellitus without complications: Secondary | ICD-10-CM | POA: Diagnosis not present

## 2017-11-24 DIAGNOSIS — G40909 Epilepsy, unspecified, not intractable, without status epilepticus: Secondary | ICD-10-CM | POA: Diagnosis not present

## 2017-11-24 DIAGNOSIS — R2689 Other abnormalities of gait and mobility: Secondary | ICD-10-CM | POA: Diagnosis not present

## 2017-11-24 DIAGNOSIS — Z794 Long term (current) use of insulin: Secondary | ICD-10-CM | POA: Diagnosis not present

## 2017-11-26 DIAGNOSIS — G40909 Epilepsy, unspecified, not intractable, without status epilepticus: Secondary | ICD-10-CM | POA: Diagnosis not present

## 2017-11-26 DIAGNOSIS — Z794 Long term (current) use of insulin: Secondary | ICD-10-CM | POA: Diagnosis not present

## 2017-11-26 DIAGNOSIS — R2689 Other abnormalities of gait and mobility: Secondary | ICD-10-CM | POA: Diagnosis not present

## 2017-11-26 DIAGNOSIS — E119 Type 2 diabetes mellitus without complications: Secondary | ICD-10-CM | POA: Diagnosis not present

## 2017-11-26 DIAGNOSIS — G809 Cerebral palsy, unspecified: Secondary | ICD-10-CM | POA: Diagnosis not present

## 2017-11-29 DIAGNOSIS — R2689 Other abnormalities of gait and mobility: Secondary | ICD-10-CM | POA: Diagnosis not present

## 2017-11-29 DIAGNOSIS — G809 Cerebral palsy, unspecified: Secondary | ICD-10-CM | POA: Diagnosis not present

## 2017-11-29 DIAGNOSIS — Z794 Long term (current) use of insulin: Secondary | ICD-10-CM | POA: Diagnosis not present

## 2017-11-29 DIAGNOSIS — E119 Type 2 diabetes mellitus without complications: Secondary | ICD-10-CM | POA: Diagnosis not present

## 2017-11-29 DIAGNOSIS — G40909 Epilepsy, unspecified, not intractable, without status epilepticus: Secondary | ICD-10-CM | POA: Diagnosis not present

## 2017-11-30 DIAGNOSIS — R2689 Other abnormalities of gait and mobility: Secondary | ICD-10-CM | POA: Diagnosis not present

## 2017-11-30 DIAGNOSIS — G809 Cerebral palsy, unspecified: Secondary | ICD-10-CM | POA: Diagnosis not present

## 2017-11-30 DIAGNOSIS — G40909 Epilepsy, unspecified, not intractable, without status epilepticus: Secondary | ICD-10-CM | POA: Diagnosis not present

## 2017-11-30 DIAGNOSIS — E119 Type 2 diabetes mellitus without complications: Secondary | ICD-10-CM | POA: Diagnosis not present

## 2017-11-30 DIAGNOSIS — Z794 Long term (current) use of insulin: Secondary | ICD-10-CM | POA: Diagnosis not present

## 2017-12-06 DIAGNOSIS — E119 Type 2 diabetes mellitus without complications: Secondary | ICD-10-CM | POA: Diagnosis not present

## 2017-12-06 DIAGNOSIS — Z794 Long term (current) use of insulin: Secondary | ICD-10-CM | POA: Diagnosis not present

## 2017-12-06 DIAGNOSIS — G40909 Epilepsy, unspecified, not intractable, without status epilepticus: Secondary | ICD-10-CM | POA: Diagnosis not present

## 2017-12-06 DIAGNOSIS — R2689 Other abnormalities of gait and mobility: Secondary | ICD-10-CM | POA: Diagnosis not present

## 2017-12-06 DIAGNOSIS — G809 Cerebral palsy, unspecified: Secondary | ICD-10-CM | POA: Diagnosis not present

## 2017-12-10 DIAGNOSIS — R2689 Other abnormalities of gait and mobility: Secondary | ICD-10-CM | POA: Diagnosis not present

## 2017-12-10 DIAGNOSIS — Z794 Long term (current) use of insulin: Secondary | ICD-10-CM | POA: Diagnosis not present

## 2017-12-10 DIAGNOSIS — G809 Cerebral palsy, unspecified: Secondary | ICD-10-CM | POA: Diagnosis not present

## 2017-12-10 DIAGNOSIS — G40909 Epilepsy, unspecified, not intractable, without status epilepticus: Secondary | ICD-10-CM | POA: Diagnosis not present

## 2017-12-10 DIAGNOSIS — E119 Type 2 diabetes mellitus without complications: Secondary | ICD-10-CM | POA: Diagnosis not present

## 2017-12-14 DIAGNOSIS — G809 Cerebral palsy, unspecified: Secondary | ICD-10-CM | POA: Diagnosis not present

## 2017-12-14 DIAGNOSIS — E119 Type 2 diabetes mellitus without complications: Secondary | ICD-10-CM | POA: Diagnosis not present

## 2017-12-14 DIAGNOSIS — Z794 Long term (current) use of insulin: Secondary | ICD-10-CM | POA: Diagnosis not present

## 2017-12-14 DIAGNOSIS — R2689 Other abnormalities of gait and mobility: Secondary | ICD-10-CM | POA: Diagnosis not present

## 2017-12-14 DIAGNOSIS — G40909 Epilepsy, unspecified, not intractable, without status epilepticus: Secondary | ICD-10-CM | POA: Diagnosis not present

## 2017-12-15 DIAGNOSIS — E119 Type 2 diabetes mellitus without complications: Secondary | ICD-10-CM | POA: Diagnosis not present

## 2017-12-15 DIAGNOSIS — G809 Cerebral palsy, unspecified: Secondary | ICD-10-CM | POA: Diagnosis not present

## 2017-12-15 DIAGNOSIS — Z794 Long term (current) use of insulin: Secondary | ICD-10-CM | POA: Diagnosis not present

## 2017-12-15 DIAGNOSIS — R2689 Other abnormalities of gait and mobility: Secondary | ICD-10-CM | POA: Diagnosis not present

## 2017-12-15 DIAGNOSIS — G40909 Epilepsy, unspecified, not intractable, without status epilepticus: Secondary | ICD-10-CM | POA: Diagnosis not present

## 2017-12-20 DIAGNOSIS — Z794 Long term (current) use of insulin: Secondary | ICD-10-CM | POA: Diagnosis not present

## 2017-12-20 DIAGNOSIS — G40909 Epilepsy, unspecified, not intractable, without status epilepticus: Secondary | ICD-10-CM | POA: Diagnosis not present

## 2017-12-20 DIAGNOSIS — G809 Cerebral palsy, unspecified: Secondary | ICD-10-CM | POA: Diagnosis not present

## 2017-12-20 DIAGNOSIS — R2689 Other abnormalities of gait and mobility: Secondary | ICD-10-CM | POA: Diagnosis not present

## 2017-12-20 DIAGNOSIS — E119 Type 2 diabetes mellitus without complications: Secondary | ICD-10-CM | POA: Diagnosis not present

## 2017-12-22 DIAGNOSIS — Z794 Long term (current) use of insulin: Secondary | ICD-10-CM | POA: Diagnosis not present

## 2017-12-22 DIAGNOSIS — G40909 Epilepsy, unspecified, not intractable, without status epilepticus: Secondary | ICD-10-CM | POA: Diagnosis not present

## 2017-12-22 DIAGNOSIS — G809 Cerebral palsy, unspecified: Secondary | ICD-10-CM | POA: Diagnosis not present

## 2017-12-22 DIAGNOSIS — E119 Type 2 diabetes mellitus without complications: Secondary | ICD-10-CM | POA: Diagnosis not present

## 2017-12-22 DIAGNOSIS — R2689 Other abnormalities of gait and mobility: Secondary | ICD-10-CM | POA: Diagnosis not present

## 2017-12-27 DIAGNOSIS — R2689 Other abnormalities of gait and mobility: Secondary | ICD-10-CM | POA: Diagnosis not present

## 2017-12-27 DIAGNOSIS — Z794 Long term (current) use of insulin: Secondary | ICD-10-CM | POA: Diagnosis not present

## 2017-12-27 DIAGNOSIS — G809 Cerebral palsy, unspecified: Secondary | ICD-10-CM | POA: Diagnosis not present

## 2017-12-27 DIAGNOSIS — E119 Type 2 diabetes mellitus without complications: Secondary | ICD-10-CM | POA: Diagnosis not present

## 2017-12-27 DIAGNOSIS — G40909 Epilepsy, unspecified, not intractable, without status epilepticus: Secondary | ICD-10-CM | POA: Diagnosis not present

## 2017-12-29 DIAGNOSIS — Z794 Long term (current) use of insulin: Secondary | ICD-10-CM | POA: Diagnosis not present

## 2017-12-29 DIAGNOSIS — G40909 Epilepsy, unspecified, not intractable, without status epilepticus: Secondary | ICD-10-CM | POA: Diagnosis not present

## 2017-12-29 DIAGNOSIS — E119 Type 2 diabetes mellitus without complications: Secondary | ICD-10-CM | POA: Diagnosis not present

## 2017-12-29 DIAGNOSIS — G809 Cerebral palsy, unspecified: Secondary | ICD-10-CM | POA: Diagnosis not present

## 2017-12-29 DIAGNOSIS — R2689 Other abnormalities of gait and mobility: Secondary | ICD-10-CM | POA: Diagnosis not present

## 2018-01-06 DIAGNOSIS — G809 Cerebral palsy, unspecified: Secondary | ICD-10-CM | POA: Diagnosis not present

## 2018-01-06 DIAGNOSIS — E119 Type 2 diabetes mellitus without complications: Secondary | ICD-10-CM | POA: Diagnosis not present

## 2018-01-06 DIAGNOSIS — G40909 Epilepsy, unspecified, not intractable, without status epilepticus: Secondary | ICD-10-CM | POA: Diagnosis not present

## 2018-01-06 DIAGNOSIS — M6281 Muscle weakness (generalized): Secondary | ICD-10-CM | POA: Diagnosis not present

## 2018-01-11 DIAGNOSIS — G809 Cerebral palsy, unspecified: Secondary | ICD-10-CM | POA: Diagnosis not present

## 2018-01-11 DIAGNOSIS — E119 Type 2 diabetes mellitus without complications: Secondary | ICD-10-CM | POA: Diagnosis not present

## 2018-01-11 DIAGNOSIS — G40909 Epilepsy, unspecified, not intractable, without status epilepticus: Secondary | ICD-10-CM | POA: Diagnosis not present

## 2018-01-11 DIAGNOSIS — M6281 Muscle weakness (generalized): Secondary | ICD-10-CM | POA: Diagnosis not present

## 2018-01-13 DIAGNOSIS — G40909 Epilepsy, unspecified, not intractable, without status epilepticus: Secondary | ICD-10-CM | POA: Diagnosis not present

## 2018-01-13 DIAGNOSIS — G809 Cerebral palsy, unspecified: Secondary | ICD-10-CM | POA: Diagnosis not present

## 2018-01-13 DIAGNOSIS — E119 Type 2 diabetes mellitus without complications: Secondary | ICD-10-CM | POA: Diagnosis not present

## 2018-01-13 DIAGNOSIS — M6281 Muscle weakness (generalized): Secondary | ICD-10-CM | POA: Diagnosis not present

## 2018-01-18 DIAGNOSIS — G809 Cerebral palsy, unspecified: Secondary | ICD-10-CM | POA: Diagnosis not present

## 2018-01-18 DIAGNOSIS — G40909 Epilepsy, unspecified, not intractable, without status epilepticus: Secondary | ICD-10-CM | POA: Diagnosis not present

## 2018-01-18 DIAGNOSIS — M6281 Muscle weakness (generalized): Secondary | ICD-10-CM | POA: Diagnosis not present

## 2018-01-18 DIAGNOSIS — E119 Type 2 diabetes mellitus without complications: Secondary | ICD-10-CM | POA: Diagnosis not present

## 2018-01-21 DIAGNOSIS — M6281 Muscle weakness (generalized): Secondary | ICD-10-CM | POA: Diagnosis not present

## 2018-01-21 DIAGNOSIS — E119 Type 2 diabetes mellitus without complications: Secondary | ICD-10-CM | POA: Diagnosis not present

## 2018-01-21 DIAGNOSIS — G809 Cerebral palsy, unspecified: Secondary | ICD-10-CM | POA: Diagnosis not present

## 2018-01-21 DIAGNOSIS — G40909 Epilepsy, unspecified, not intractable, without status epilepticus: Secondary | ICD-10-CM | POA: Diagnosis not present

## 2018-01-25 DIAGNOSIS — M6281 Muscle weakness (generalized): Secondary | ICD-10-CM | POA: Diagnosis not present

## 2018-01-25 DIAGNOSIS — G809 Cerebral palsy, unspecified: Secondary | ICD-10-CM | POA: Diagnosis not present

## 2018-01-25 DIAGNOSIS — G40909 Epilepsy, unspecified, not intractable, without status epilepticus: Secondary | ICD-10-CM | POA: Diagnosis not present

## 2018-01-25 DIAGNOSIS — E119 Type 2 diabetes mellitus without complications: Secondary | ICD-10-CM | POA: Diagnosis not present

## 2018-01-28 DIAGNOSIS — G40909 Epilepsy, unspecified, not intractable, without status epilepticus: Secondary | ICD-10-CM | POA: Diagnosis not present

## 2018-01-28 DIAGNOSIS — E119 Type 2 diabetes mellitus without complications: Secondary | ICD-10-CM | POA: Diagnosis not present

## 2018-01-28 DIAGNOSIS — G809 Cerebral palsy, unspecified: Secondary | ICD-10-CM | POA: Diagnosis not present

## 2018-01-28 DIAGNOSIS — M6281 Muscle weakness (generalized): Secondary | ICD-10-CM | POA: Diagnosis not present

## 2018-02-01 DIAGNOSIS — G40909 Epilepsy, unspecified, not intractable, without status epilepticus: Secondary | ICD-10-CM | POA: Diagnosis not present

## 2018-02-01 DIAGNOSIS — G809 Cerebral palsy, unspecified: Secondary | ICD-10-CM | POA: Diagnosis not present

## 2018-02-01 DIAGNOSIS — E119 Type 2 diabetes mellitus without complications: Secondary | ICD-10-CM | POA: Diagnosis not present

## 2018-02-01 DIAGNOSIS — M6281 Muscle weakness (generalized): Secondary | ICD-10-CM | POA: Diagnosis not present

## 2018-02-03 DIAGNOSIS — E119 Type 2 diabetes mellitus without complications: Secondary | ICD-10-CM | POA: Diagnosis not present

## 2018-02-03 DIAGNOSIS — M6281 Muscle weakness (generalized): Secondary | ICD-10-CM | POA: Diagnosis not present

## 2018-02-03 DIAGNOSIS — G40909 Epilepsy, unspecified, not intractable, without status epilepticus: Secondary | ICD-10-CM | POA: Diagnosis not present

## 2018-02-03 DIAGNOSIS — G809 Cerebral palsy, unspecified: Secondary | ICD-10-CM | POA: Diagnosis not present

## 2018-02-08 DIAGNOSIS — M6281 Muscle weakness (generalized): Secondary | ICD-10-CM | POA: Diagnosis not present

## 2018-02-08 DIAGNOSIS — G40909 Epilepsy, unspecified, not intractable, without status epilepticus: Secondary | ICD-10-CM | POA: Diagnosis not present

## 2018-02-08 DIAGNOSIS — G809 Cerebral palsy, unspecified: Secondary | ICD-10-CM | POA: Diagnosis not present

## 2018-02-08 DIAGNOSIS — E119 Type 2 diabetes mellitus without complications: Secondary | ICD-10-CM | POA: Diagnosis not present

## 2018-02-10 DIAGNOSIS — M6281 Muscle weakness (generalized): Secondary | ICD-10-CM | POA: Diagnosis not present

## 2018-02-10 DIAGNOSIS — G40909 Epilepsy, unspecified, not intractable, without status epilepticus: Secondary | ICD-10-CM | POA: Diagnosis not present

## 2018-02-10 DIAGNOSIS — G809 Cerebral palsy, unspecified: Secondary | ICD-10-CM | POA: Diagnosis not present

## 2018-02-10 DIAGNOSIS — E119 Type 2 diabetes mellitus without complications: Secondary | ICD-10-CM | POA: Diagnosis not present

## 2018-02-15 DIAGNOSIS — G809 Cerebral palsy, unspecified: Secondary | ICD-10-CM | POA: Diagnosis not present

## 2018-02-15 DIAGNOSIS — E119 Type 2 diabetes mellitus without complications: Secondary | ICD-10-CM | POA: Diagnosis not present

## 2018-02-15 DIAGNOSIS — M6281 Muscle weakness (generalized): Secondary | ICD-10-CM | POA: Diagnosis not present

## 2018-02-15 DIAGNOSIS — G40909 Epilepsy, unspecified, not intractable, without status epilepticus: Secondary | ICD-10-CM | POA: Diagnosis not present

## 2018-02-17 DIAGNOSIS — E119 Type 2 diabetes mellitus without complications: Secondary | ICD-10-CM | POA: Diagnosis not present

## 2018-02-17 DIAGNOSIS — G809 Cerebral palsy, unspecified: Secondary | ICD-10-CM | POA: Diagnosis not present

## 2018-02-17 DIAGNOSIS — G40909 Epilepsy, unspecified, not intractable, without status epilepticus: Secondary | ICD-10-CM | POA: Diagnosis not present

## 2018-02-17 DIAGNOSIS — M6281 Muscle weakness (generalized): Secondary | ICD-10-CM | POA: Diagnosis not present

## 2018-02-21 DIAGNOSIS — G40909 Epilepsy, unspecified, not intractable, without status epilepticus: Secondary | ICD-10-CM | POA: Diagnosis not present

## 2018-02-21 DIAGNOSIS — G809 Cerebral palsy, unspecified: Secondary | ICD-10-CM | POA: Diagnosis not present

## 2018-02-21 DIAGNOSIS — M6281 Muscle weakness (generalized): Secondary | ICD-10-CM | POA: Diagnosis not present

## 2018-02-22 DIAGNOSIS — E119 Type 2 diabetes mellitus without complications: Secondary | ICD-10-CM | POA: Diagnosis not present

## 2018-02-22 DIAGNOSIS — G809 Cerebral palsy, unspecified: Secondary | ICD-10-CM | POA: Diagnosis not present

## 2018-02-22 DIAGNOSIS — M6281 Muscle weakness (generalized): Secondary | ICD-10-CM | POA: Diagnosis not present

## 2018-02-22 DIAGNOSIS — Z23 Encounter for immunization: Secondary | ICD-10-CM | POA: Diagnosis not present

## 2018-02-22 DIAGNOSIS — G40909 Epilepsy, unspecified, not intractable, without status epilepticus: Secondary | ICD-10-CM | POA: Diagnosis not present

## 2018-02-24 DIAGNOSIS — G40909 Epilepsy, unspecified, not intractable, without status epilepticus: Secondary | ICD-10-CM | POA: Diagnosis not present

## 2018-02-24 DIAGNOSIS — E119 Type 2 diabetes mellitus without complications: Secondary | ICD-10-CM | POA: Diagnosis not present

## 2018-02-24 DIAGNOSIS — M6281 Muscle weakness (generalized): Secondary | ICD-10-CM | POA: Diagnosis not present

## 2018-02-24 DIAGNOSIS — G809 Cerebral palsy, unspecified: Secondary | ICD-10-CM | POA: Diagnosis not present

## 2018-02-28 DIAGNOSIS — M6281 Muscle weakness (generalized): Secondary | ICD-10-CM | POA: Diagnosis not present

## 2018-02-28 DIAGNOSIS — G809 Cerebral palsy, unspecified: Secondary | ICD-10-CM | POA: Diagnosis not present

## 2018-02-28 DIAGNOSIS — G40909 Epilepsy, unspecified, not intractable, without status epilepticus: Secondary | ICD-10-CM | POA: Diagnosis not present

## 2018-03-03 DIAGNOSIS — G809 Cerebral palsy, unspecified: Secondary | ICD-10-CM | POA: Diagnosis not present

## 2018-03-03 DIAGNOSIS — M6281 Muscle weakness (generalized): Secondary | ICD-10-CM | POA: Diagnosis not present

## 2018-03-03 DIAGNOSIS — G40909 Epilepsy, unspecified, not intractable, without status epilepticus: Secondary | ICD-10-CM | POA: Diagnosis not present

## 2018-03-07 DIAGNOSIS — G40909 Epilepsy, unspecified, not intractable, without status epilepticus: Secondary | ICD-10-CM | POA: Diagnosis not present

## 2018-03-07 DIAGNOSIS — M6281 Muscle weakness (generalized): Secondary | ICD-10-CM | POA: Diagnosis not present

## 2018-03-07 DIAGNOSIS — G809 Cerebral palsy, unspecified: Secondary | ICD-10-CM | POA: Diagnosis not present

## 2018-03-10 DIAGNOSIS — G809 Cerebral palsy, unspecified: Secondary | ICD-10-CM | POA: Diagnosis not present

## 2018-03-10 DIAGNOSIS — G40909 Epilepsy, unspecified, not intractable, without status epilepticus: Secondary | ICD-10-CM | POA: Diagnosis not present

## 2018-03-10 DIAGNOSIS — M6281 Muscle weakness (generalized): Secondary | ICD-10-CM | POA: Diagnosis not present

## 2018-03-14 DIAGNOSIS — G40909 Epilepsy, unspecified, not intractable, without status epilepticus: Secondary | ICD-10-CM | POA: Diagnosis not present

## 2018-03-14 DIAGNOSIS — G809 Cerebral palsy, unspecified: Secondary | ICD-10-CM | POA: Diagnosis not present

## 2018-03-14 DIAGNOSIS — M6281 Muscle weakness (generalized): Secondary | ICD-10-CM | POA: Diagnosis not present

## 2018-03-17 DIAGNOSIS — M6281 Muscle weakness (generalized): Secondary | ICD-10-CM | POA: Diagnosis not present

## 2018-03-17 DIAGNOSIS — G40909 Epilepsy, unspecified, not intractable, without status epilepticus: Secondary | ICD-10-CM | POA: Diagnosis not present

## 2018-03-17 DIAGNOSIS — G809 Cerebral palsy, unspecified: Secondary | ICD-10-CM | POA: Diagnosis not present

## 2018-03-23 DIAGNOSIS — Z794 Long term (current) use of insulin: Secondary | ICD-10-CM | POA: Diagnosis not present

## 2018-03-23 DIAGNOSIS — E119 Type 2 diabetes mellitus without complications: Secondary | ICD-10-CM | POA: Diagnosis not present

## 2018-03-23 DIAGNOSIS — H25813 Combined forms of age-related cataract, bilateral: Secondary | ICD-10-CM | POA: Diagnosis not present

## 2018-03-24 DIAGNOSIS — G809 Cerebral palsy, unspecified: Secondary | ICD-10-CM | POA: Diagnosis not present

## 2018-03-24 DIAGNOSIS — M6281 Muscle weakness (generalized): Secondary | ICD-10-CM | POA: Diagnosis not present

## 2018-03-24 DIAGNOSIS — G40909 Epilepsy, unspecified, not intractable, without status epilepticus: Secondary | ICD-10-CM | POA: Diagnosis not present

## 2018-03-28 DIAGNOSIS — M6281 Muscle weakness (generalized): Secondary | ICD-10-CM | POA: Diagnosis not present

## 2018-03-28 DIAGNOSIS — G809 Cerebral palsy, unspecified: Secondary | ICD-10-CM | POA: Diagnosis not present

## 2018-03-28 DIAGNOSIS — G40909 Epilepsy, unspecified, not intractable, without status epilepticus: Secondary | ICD-10-CM | POA: Diagnosis not present

## 2018-03-31 DIAGNOSIS — G809 Cerebral palsy, unspecified: Secondary | ICD-10-CM | POA: Diagnosis not present

## 2018-03-31 DIAGNOSIS — G40909 Epilepsy, unspecified, not intractable, without status epilepticus: Secondary | ICD-10-CM | POA: Diagnosis not present

## 2018-03-31 DIAGNOSIS — M6281 Muscle weakness (generalized): Secondary | ICD-10-CM | POA: Diagnosis not present

## 2018-04-04 DIAGNOSIS — M6281 Muscle weakness (generalized): Secondary | ICD-10-CM | POA: Diagnosis not present

## 2018-04-04 DIAGNOSIS — G809 Cerebral palsy, unspecified: Secondary | ICD-10-CM | POA: Diagnosis not present

## 2018-04-04 DIAGNOSIS — G40909 Epilepsy, unspecified, not intractable, without status epilepticus: Secondary | ICD-10-CM | POA: Diagnosis not present

## 2018-04-05 DIAGNOSIS — M79674 Pain in right toe(s): Secondary | ICD-10-CM | POA: Diagnosis not present

## 2018-04-05 DIAGNOSIS — B351 Tinea unguium: Secondary | ICD-10-CM | POA: Diagnosis not present

## 2018-04-05 DIAGNOSIS — M79675 Pain in left toe(s): Secondary | ICD-10-CM | POA: Diagnosis not present

## 2018-04-07 DIAGNOSIS — M6281 Muscle weakness (generalized): Secondary | ICD-10-CM | POA: Diagnosis not present

## 2018-04-07 DIAGNOSIS — G40909 Epilepsy, unspecified, not intractable, without status epilepticus: Secondary | ICD-10-CM | POA: Diagnosis not present

## 2018-04-07 DIAGNOSIS — G809 Cerebral palsy, unspecified: Secondary | ICD-10-CM | POA: Diagnosis not present

## 2018-06-07 DIAGNOSIS — B351 Tinea unguium: Secondary | ICD-10-CM | POA: Diagnosis not present

## 2018-06-07 DIAGNOSIS — M79674 Pain in right toe(s): Secondary | ICD-10-CM | POA: Diagnosis not present

## 2018-06-07 DIAGNOSIS — M79675 Pain in left toe(s): Secondary | ICD-10-CM | POA: Diagnosis not present

## 2018-06-10 DIAGNOSIS — R569 Unspecified convulsions: Secondary | ICD-10-CM | POA: Diagnosis not present

## 2018-06-10 DIAGNOSIS — E119 Type 2 diabetes mellitus without complications: Secondary | ICD-10-CM | POA: Diagnosis not present

## 2018-06-10 DIAGNOSIS — G809 Cerebral palsy, unspecified: Secondary | ICD-10-CM | POA: Diagnosis not present

## 2018-06-10 DIAGNOSIS — J301 Allergic rhinitis due to pollen: Secondary | ICD-10-CM | POA: Diagnosis not present

## 2018-06-14 DIAGNOSIS — G4089 Other seizures: Secondary | ICD-10-CM | POA: Diagnosis not present

## 2018-06-14 DIAGNOSIS — R262 Difficulty in walking, not elsewhere classified: Secondary | ICD-10-CM | POA: Diagnosis not present

## 2018-06-14 DIAGNOSIS — E119 Type 2 diabetes mellitus without complications: Secondary | ICD-10-CM | POA: Diagnosis not present

## 2018-06-14 DIAGNOSIS — E11628 Type 2 diabetes mellitus with other skin complications: Secondary | ICD-10-CM | POA: Diagnosis not present

## 2018-06-14 DIAGNOSIS — G809 Cerebral palsy, unspecified: Secondary | ICD-10-CM | POA: Diagnosis not present

## 2018-06-14 DIAGNOSIS — Z794 Long term (current) use of insulin: Secondary | ICD-10-CM | POA: Diagnosis not present

## 2018-06-14 DIAGNOSIS — R21 Rash and other nonspecific skin eruption: Secondary | ICD-10-CM | POA: Diagnosis not present

## 2018-06-15 DIAGNOSIS — G809 Cerebral palsy, unspecified: Secondary | ICD-10-CM | POA: Diagnosis not present

## 2018-06-15 DIAGNOSIS — E119 Type 2 diabetes mellitus without complications: Secondary | ICD-10-CM | POA: Diagnosis not present

## 2018-06-15 DIAGNOSIS — Z794 Long term (current) use of insulin: Secondary | ICD-10-CM | POA: Diagnosis not present

## 2018-06-15 DIAGNOSIS — R21 Rash and other nonspecific skin eruption: Secondary | ICD-10-CM | POA: Diagnosis not present

## 2018-06-15 DIAGNOSIS — G4089 Other seizures: Secondary | ICD-10-CM | POA: Diagnosis not present

## 2018-06-15 DIAGNOSIS — E11628 Type 2 diabetes mellitus with other skin complications: Secondary | ICD-10-CM | POA: Diagnosis not present

## 2018-06-21 DIAGNOSIS — E11628 Type 2 diabetes mellitus with other skin complications: Secondary | ICD-10-CM | POA: Diagnosis not present

## 2018-06-21 DIAGNOSIS — E119 Type 2 diabetes mellitus without complications: Secondary | ICD-10-CM | POA: Diagnosis not present

## 2018-06-21 DIAGNOSIS — G809 Cerebral palsy, unspecified: Secondary | ICD-10-CM | POA: Diagnosis not present

## 2018-06-21 DIAGNOSIS — G4089 Other seizures: Secondary | ICD-10-CM | POA: Diagnosis not present

## 2018-06-21 DIAGNOSIS — R21 Rash and other nonspecific skin eruption: Secondary | ICD-10-CM | POA: Diagnosis not present

## 2018-06-21 DIAGNOSIS — Z794 Long term (current) use of insulin: Secondary | ICD-10-CM | POA: Diagnosis not present

## 2018-06-22 DIAGNOSIS — G4089 Other seizures: Secondary | ICD-10-CM | POA: Diagnosis not present

## 2018-06-22 DIAGNOSIS — Z794 Long term (current) use of insulin: Secondary | ICD-10-CM | POA: Diagnosis not present

## 2018-06-22 DIAGNOSIS — E119 Type 2 diabetes mellitus without complications: Secondary | ICD-10-CM | POA: Diagnosis not present

## 2018-06-22 DIAGNOSIS — G809 Cerebral palsy, unspecified: Secondary | ICD-10-CM | POA: Diagnosis not present

## 2018-06-22 DIAGNOSIS — E11628 Type 2 diabetes mellitus with other skin complications: Secondary | ICD-10-CM | POA: Diagnosis not present

## 2018-06-22 DIAGNOSIS — R21 Rash and other nonspecific skin eruption: Secondary | ICD-10-CM | POA: Diagnosis not present

## 2018-06-27 DIAGNOSIS — G809 Cerebral palsy, unspecified: Secondary | ICD-10-CM | POA: Diagnosis not present

## 2018-06-27 DIAGNOSIS — R21 Rash and other nonspecific skin eruption: Secondary | ICD-10-CM | POA: Diagnosis not present

## 2018-06-27 DIAGNOSIS — E11628 Type 2 diabetes mellitus with other skin complications: Secondary | ICD-10-CM | POA: Diagnosis not present

## 2018-06-27 DIAGNOSIS — Z794 Long term (current) use of insulin: Secondary | ICD-10-CM | POA: Diagnosis not present

## 2018-06-27 DIAGNOSIS — G4089 Other seizures: Secondary | ICD-10-CM | POA: Diagnosis not present

## 2018-06-27 DIAGNOSIS — E119 Type 2 diabetes mellitus without complications: Secondary | ICD-10-CM | POA: Diagnosis not present

## 2018-06-29 DIAGNOSIS — Z794 Long term (current) use of insulin: Secondary | ICD-10-CM | POA: Diagnosis not present

## 2018-06-29 DIAGNOSIS — E11628 Type 2 diabetes mellitus with other skin complications: Secondary | ICD-10-CM | POA: Diagnosis not present

## 2018-06-29 DIAGNOSIS — G809 Cerebral palsy, unspecified: Secondary | ICD-10-CM | POA: Diagnosis not present

## 2018-06-29 DIAGNOSIS — R21 Rash and other nonspecific skin eruption: Secondary | ICD-10-CM | POA: Diagnosis not present

## 2018-06-29 DIAGNOSIS — G4089 Other seizures: Secondary | ICD-10-CM | POA: Diagnosis not present

## 2018-06-29 DIAGNOSIS — E119 Type 2 diabetes mellitus without complications: Secondary | ICD-10-CM | POA: Diagnosis not present

## 2018-06-30 DIAGNOSIS — Z794 Long term (current) use of insulin: Secondary | ICD-10-CM | POA: Diagnosis not present

## 2018-06-30 DIAGNOSIS — G809 Cerebral palsy, unspecified: Secondary | ICD-10-CM | POA: Diagnosis not present

## 2018-06-30 DIAGNOSIS — E11628 Type 2 diabetes mellitus with other skin complications: Secondary | ICD-10-CM | POA: Diagnosis not present

## 2018-06-30 DIAGNOSIS — E119 Type 2 diabetes mellitus without complications: Secondary | ICD-10-CM | POA: Diagnosis not present

## 2018-06-30 DIAGNOSIS — G4089 Other seizures: Secondary | ICD-10-CM | POA: Diagnosis not present

## 2018-06-30 DIAGNOSIS — R21 Rash and other nonspecific skin eruption: Secondary | ICD-10-CM | POA: Diagnosis not present

## 2018-07-04 DIAGNOSIS — E11628 Type 2 diabetes mellitus with other skin complications: Secondary | ICD-10-CM | POA: Diagnosis not present

## 2018-07-04 DIAGNOSIS — G809 Cerebral palsy, unspecified: Secondary | ICD-10-CM | POA: Diagnosis not present

## 2018-07-04 DIAGNOSIS — R21 Rash and other nonspecific skin eruption: Secondary | ICD-10-CM | POA: Diagnosis not present

## 2018-07-04 DIAGNOSIS — E119 Type 2 diabetes mellitus without complications: Secondary | ICD-10-CM | POA: Diagnosis not present

## 2018-07-04 DIAGNOSIS — G4089 Other seizures: Secondary | ICD-10-CM | POA: Diagnosis not present

## 2018-07-04 DIAGNOSIS — Z794 Long term (current) use of insulin: Secondary | ICD-10-CM | POA: Diagnosis not present

## 2018-07-07 DIAGNOSIS — R21 Rash and other nonspecific skin eruption: Secondary | ICD-10-CM | POA: Diagnosis not present

## 2018-07-07 DIAGNOSIS — G4089 Other seizures: Secondary | ICD-10-CM | POA: Diagnosis not present

## 2018-07-07 DIAGNOSIS — E11628 Type 2 diabetes mellitus with other skin complications: Secondary | ICD-10-CM | POA: Diagnosis not present

## 2018-07-07 DIAGNOSIS — Z794 Long term (current) use of insulin: Secondary | ICD-10-CM | POA: Diagnosis not present

## 2018-07-07 DIAGNOSIS — G809 Cerebral palsy, unspecified: Secondary | ICD-10-CM | POA: Diagnosis not present

## 2018-07-07 DIAGNOSIS — E119 Type 2 diabetes mellitus without complications: Secondary | ICD-10-CM | POA: Diagnosis not present

## 2018-07-08 IMAGING — CR DG CHEST 1V PORT
1 series · 1 of 1 positions shown · non-contrast
Comparison: 07/17/2017

CLINICAL DATA: Weakness

EXAM:
PORTABLE CHEST 1 VIEW

[portable]
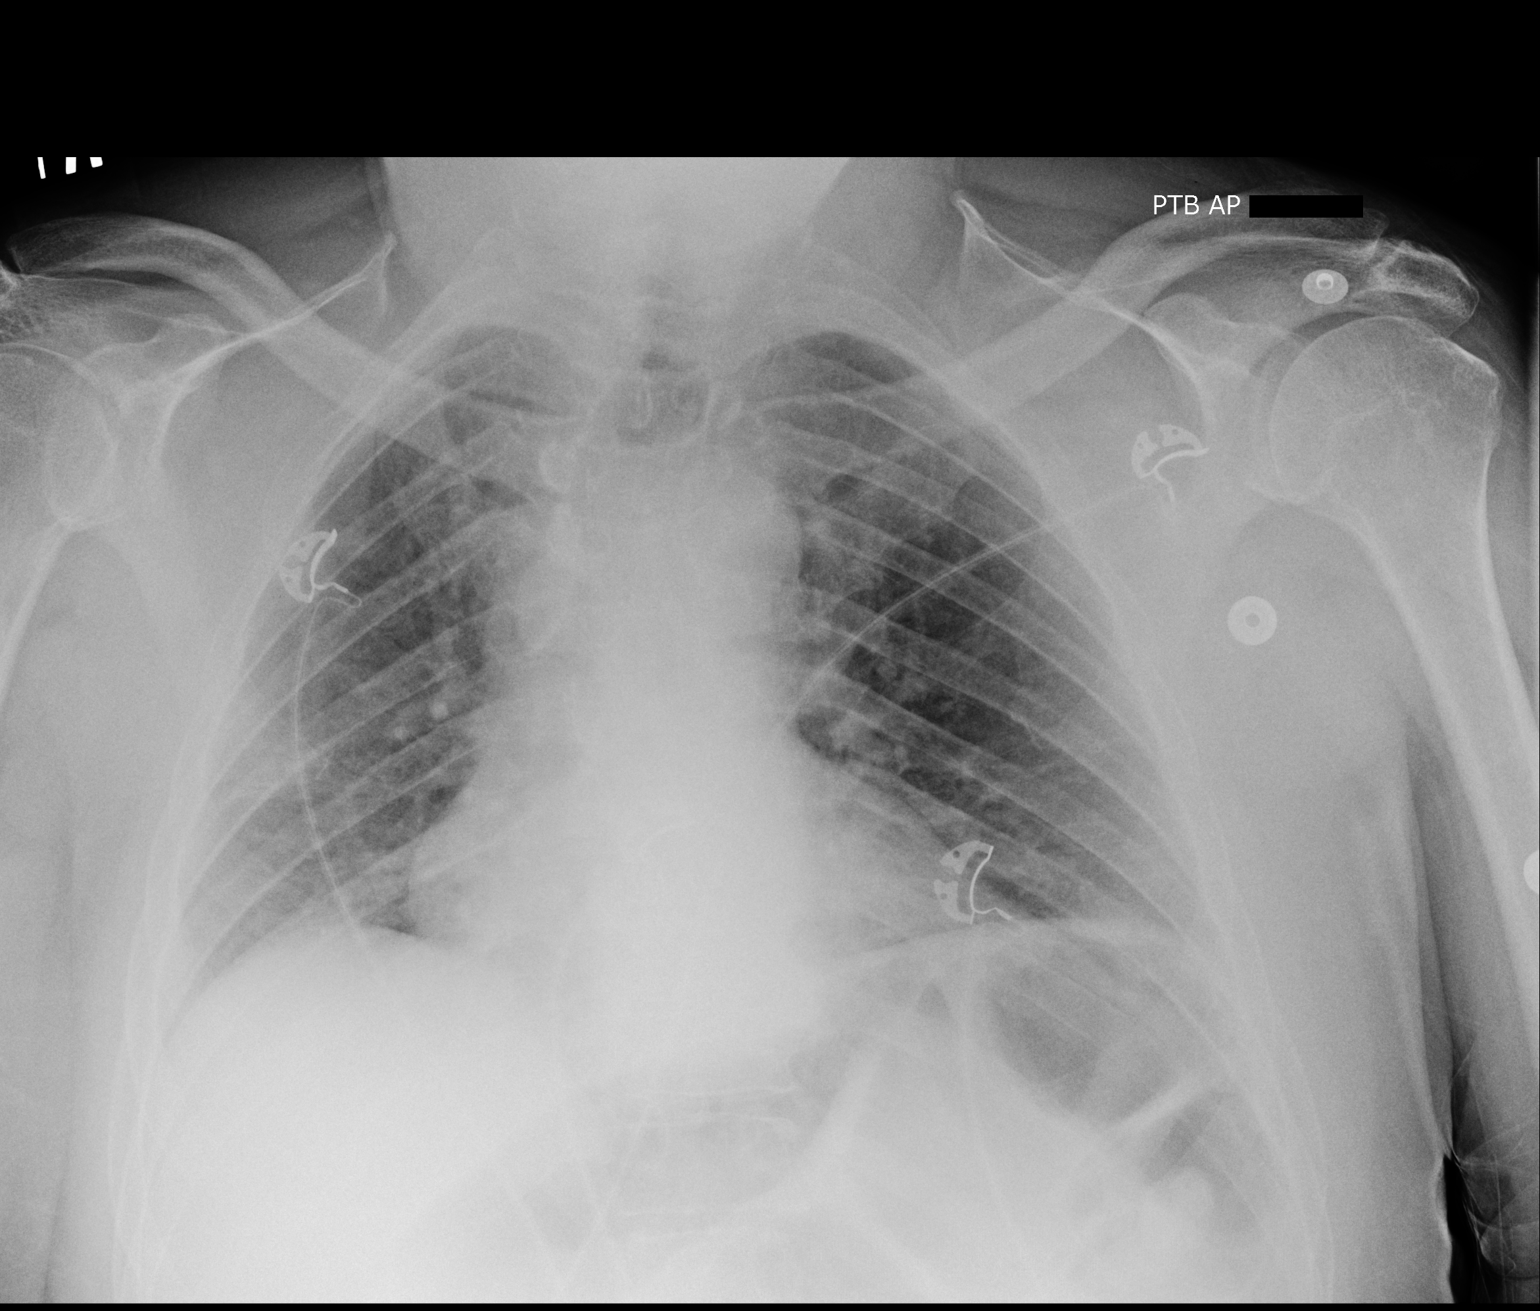

[1 of 1 positions shown; findings below may reference images not displayed]

FINDINGS: Cardiac shadow is mildly enlarged but stable. The lungs are well
aerated bilaterally. Mild bibasilar atelectatic changes are noted
right greater than left slightly increased from the prior exam. No
bony abnormality is noted.
IMPRESSION: Bibasilar changes as described.

## 2018-07-08 IMAGING — CT CT HEAD W/O CM
3 of 4 series · 16 of 47 positions shown, 19 images · non-contrast
Comparison: 10/30/2016

CLINICAL DATA: Altered mental status

EXAM:
CT HEAD WITHOUT CONTRAST
TECHNIQUE: Contiguous axial images were obtained from the base of the skull
through the vertex without intravenous contrast.

[Series 2: head wo · axial · 0.40mm/px · z∈[+45,+175]mm · 10 of 32 slices shown, 13 images]
[im 3/32  brain]
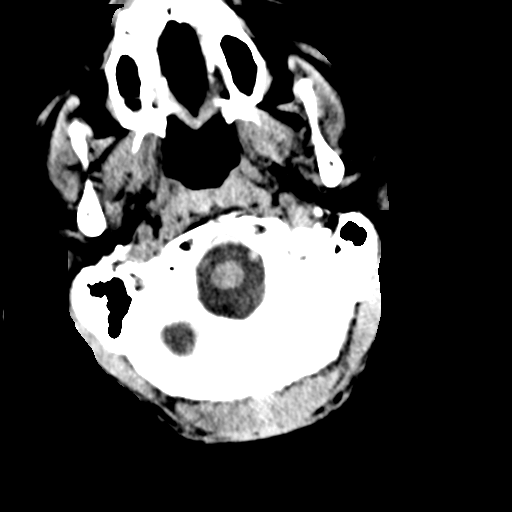
[im 3/32  bone]
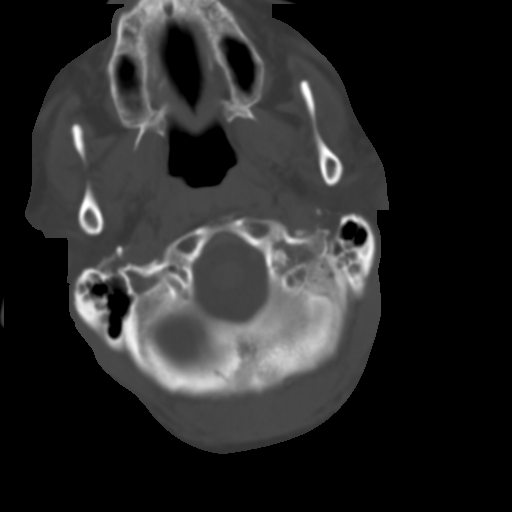
[im 5/32  brain]
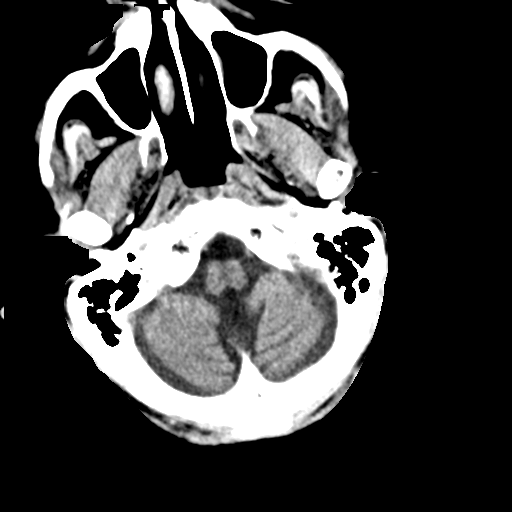
[im 9/32  brain]
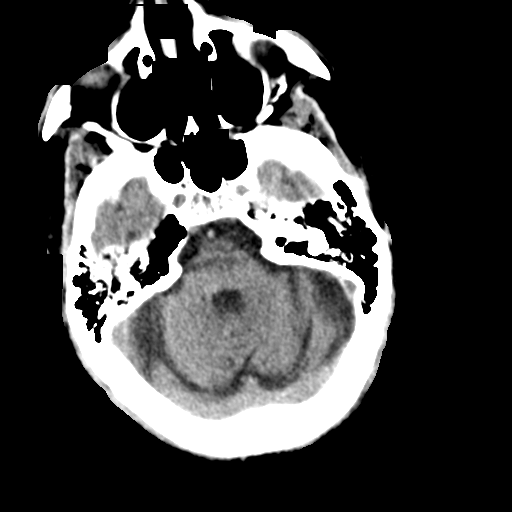
[im 12/32  brain]
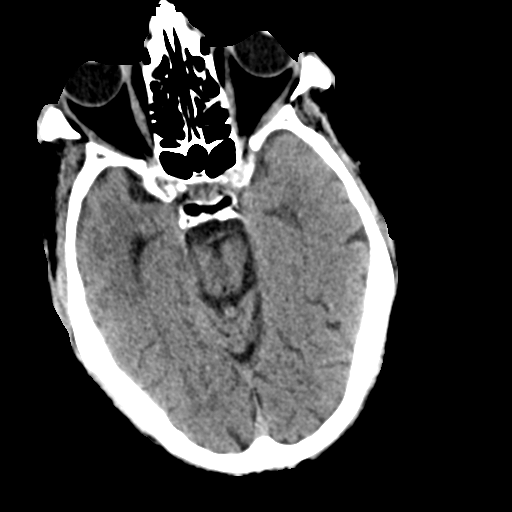
[im 14/32  brain]
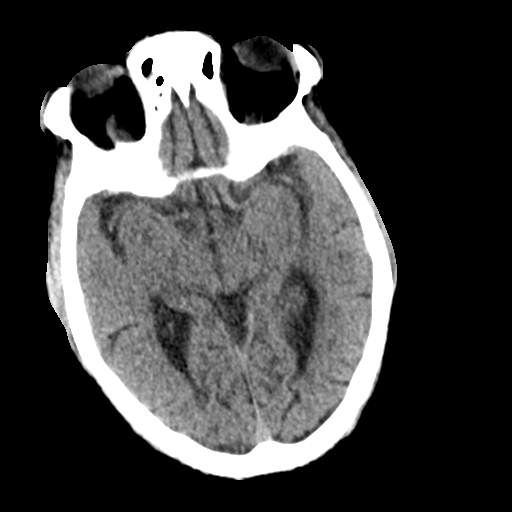
[im 14/32  bone]
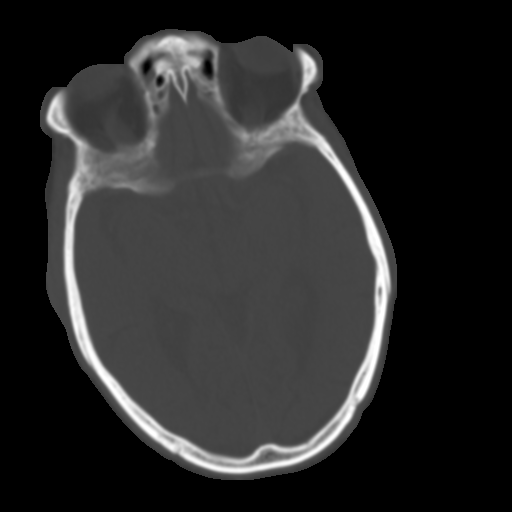
[im 18/32  brain]
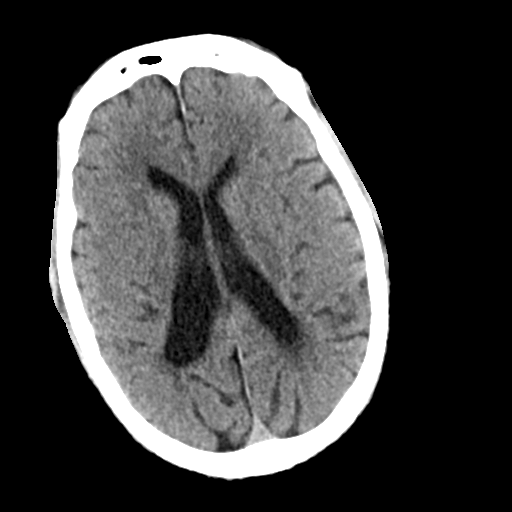
[im 20/32  brain]
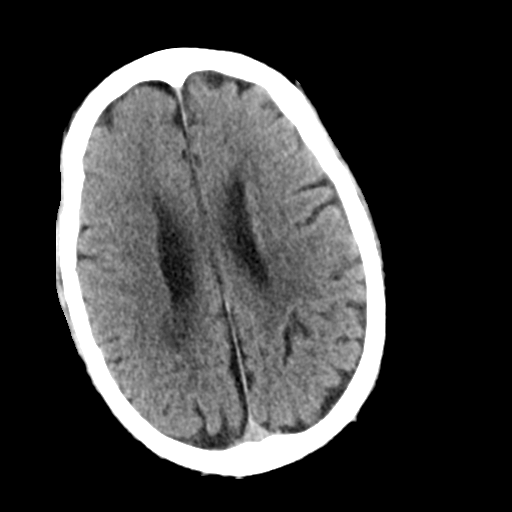
[im 23/32  brain]
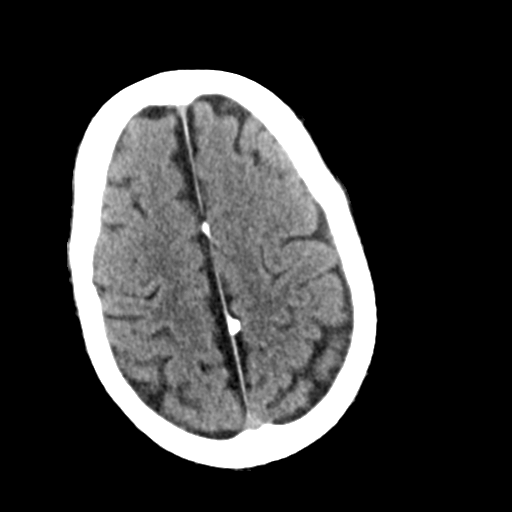
[im 27/32  brain]
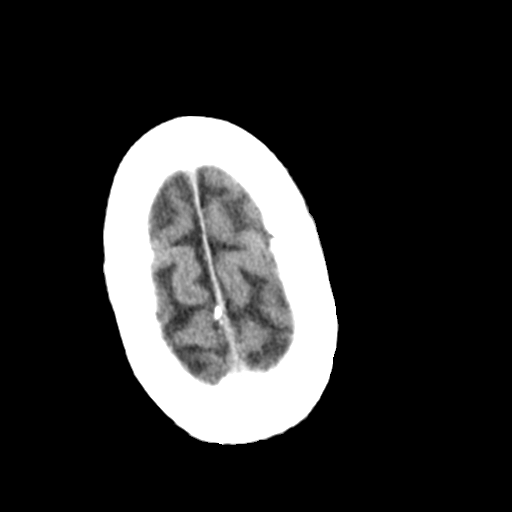
[im 27/32  bone]
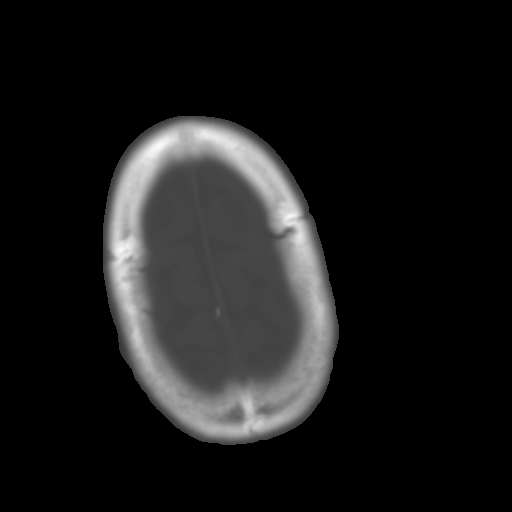
[im 29/32  brain]
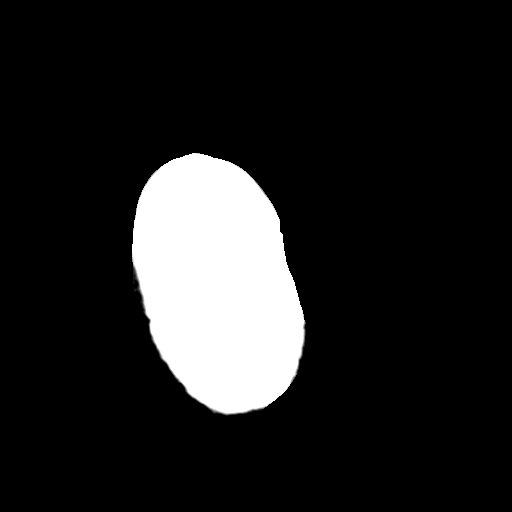

[Series 4: coronal soft tissue · coronal · 0.32mm/px · 3 of 68 slices shown]
[im 23/68  brain]
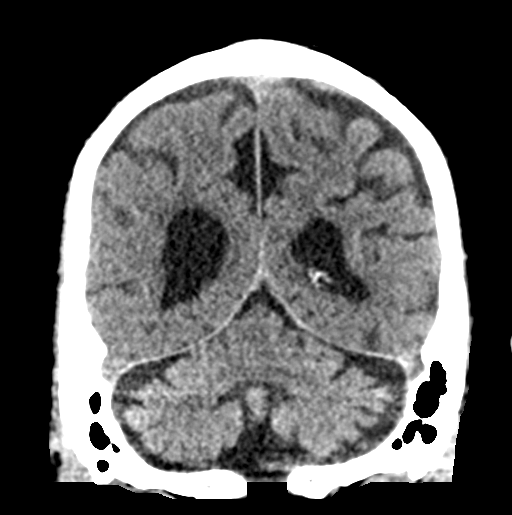
[im 30/68  brain]
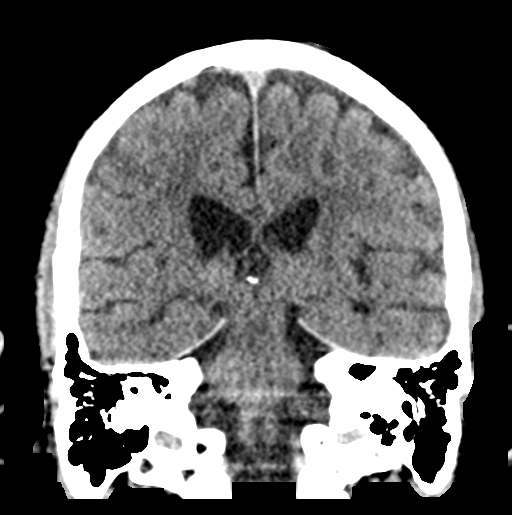
[im 38/68  brain]
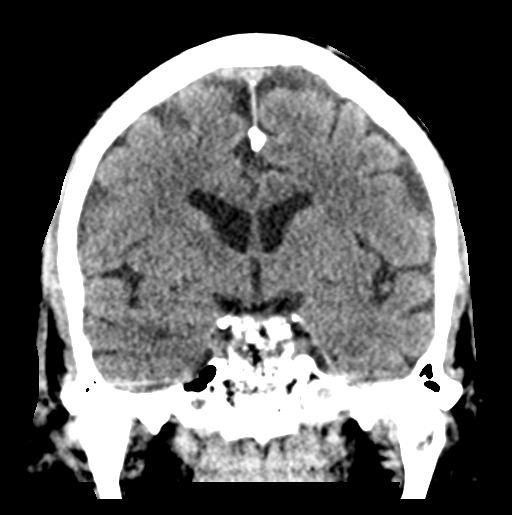

[Series 5: sagittal soft tissue · sagittal · 0.32mm/px · 3 of 57 slices shown]
[im 19/57  brain]
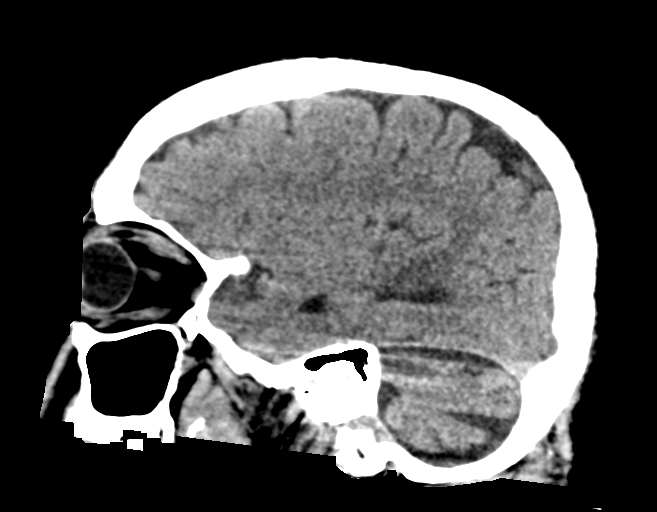
[im 29/57  brain]
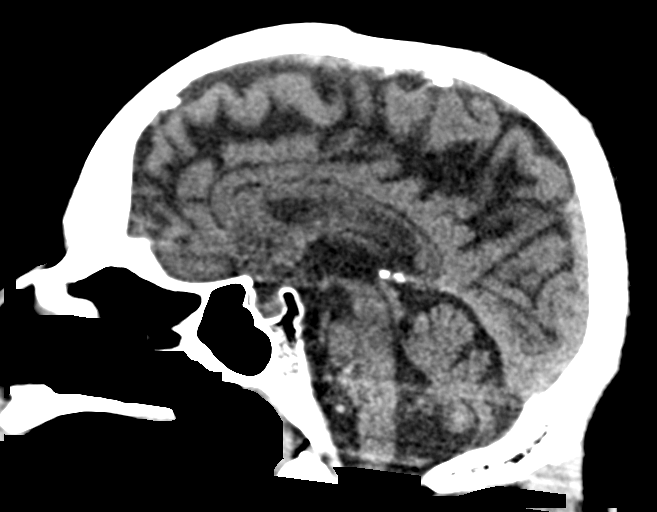
[im 38/57  brain]
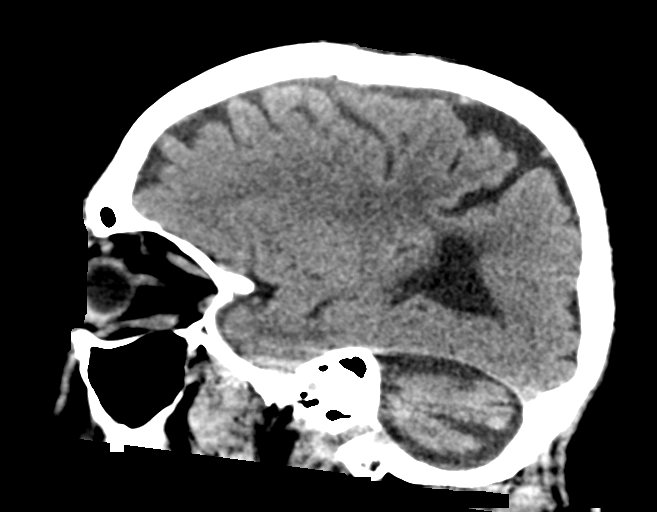

[16 of 47 positions shown; findings below may reference images not displayed]

FINDINGS: Brain: Diffuse cerebral and cerebellar atrophy is identified. The
cerebellar atrophy is appears slightly more marked. No findings to
suggest acute hemorrhage, acute infarction or space occupying mass
are noted.

Vascular: No hyperdense vessel or unexpected calcification.

Skull: Normal. Negative for fracture or focal lesion.

Sinuses/Orbits: No acute finding.

Other: None.
IMPRESSION: Atrophic changes without acute abnormality. The overall appearance
is stable from the prior exam.

## 2018-07-12 DIAGNOSIS — G4089 Other seizures: Secondary | ICD-10-CM | POA: Diagnosis not present

## 2018-07-12 DIAGNOSIS — E119 Type 2 diabetes mellitus without complications: Secondary | ICD-10-CM | POA: Diagnosis not present

## 2018-07-12 DIAGNOSIS — G809 Cerebral palsy, unspecified: Secondary | ICD-10-CM | POA: Diagnosis not present

## 2018-07-12 DIAGNOSIS — Z794 Long term (current) use of insulin: Secondary | ICD-10-CM | POA: Diagnosis not present

## 2018-07-12 DIAGNOSIS — E11628 Type 2 diabetes mellitus with other skin complications: Secondary | ICD-10-CM | POA: Diagnosis not present

## 2018-07-12 DIAGNOSIS — R21 Rash and other nonspecific skin eruption: Secondary | ICD-10-CM | POA: Diagnosis not present

## 2018-07-14 DIAGNOSIS — R21 Rash and other nonspecific skin eruption: Secondary | ICD-10-CM | POA: Diagnosis not present

## 2018-07-14 DIAGNOSIS — E11628 Type 2 diabetes mellitus with other skin complications: Secondary | ICD-10-CM | POA: Diagnosis not present

## 2018-07-14 DIAGNOSIS — E119 Type 2 diabetes mellitus without complications: Secondary | ICD-10-CM | POA: Diagnosis not present

## 2018-07-14 DIAGNOSIS — G4089 Other seizures: Secondary | ICD-10-CM | POA: Diagnosis not present

## 2018-07-14 DIAGNOSIS — R262 Difficulty in walking, not elsewhere classified: Secondary | ICD-10-CM | POA: Diagnosis not present

## 2018-07-14 DIAGNOSIS — G809 Cerebral palsy, unspecified: Secondary | ICD-10-CM | POA: Diagnosis not present

## 2018-07-14 DIAGNOSIS — Z794 Long term (current) use of insulin: Secondary | ICD-10-CM | POA: Diagnosis not present

## 2018-07-18 DIAGNOSIS — E11628 Type 2 diabetes mellitus with other skin complications: Secondary | ICD-10-CM | POA: Diagnosis not present

## 2018-07-18 DIAGNOSIS — G4089 Other seizures: Secondary | ICD-10-CM | POA: Diagnosis not present

## 2018-07-18 DIAGNOSIS — G809 Cerebral palsy, unspecified: Secondary | ICD-10-CM | POA: Diagnosis not present

## 2018-07-18 DIAGNOSIS — Z794 Long term (current) use of insulin: Secondary | ICD-10-CM | POA: Diagnosis not present

## 2018-07-18 DIAGNOSIS — R21 Rash and other nonspecific skin eruption: Secondary | ICD-10-CM | POA: Diagnosis not present

## 2018-07-18 DIAGNOSIS — E119 Type 2 diabetes mellitus without complications: Secondary | ICD-10-CM | POA: Diagnosis not present

## 2018-07-25 DIAGNOSIS — Z794 Long term (current) use of insulin: Secondary | ICD-10-CM | POA: Diagnosis not present

## 2018-07-25 DIAGNOSIS — G4089 Other seizures: Secondary | ICD-10-CM | POA: Diagnosis not present

## 2018-07-25 DIAGNOSIS — E11628 Type 2 diabetes mellitus with other skin complications: Secondary | ICD-10-CM | POA: Diagnosis not present

## 2018-07-25 DIAGNOSIS — E119 Type 2 diabetes mellitus without complications: Secondary | ICD-10-CM | POA: Diagnosis not present

## 2018-07-25 DIAGNOSIS — G809 Cerebral palsy, unspecified: Secondary | ICD-10-CM | POA: Diagnosis not present

## 2018-07-25 DIAGNOSIS — R21 Rash and other nonspecific skin eruption: Secondary | ICD-10-CM | POA: Diagnosis not present

## 2018-08-02 DIAGNOSIS — Z794 Long term (current) use of insulin: Secondary | ICD-10-CM | POA: Diagnosis not present

## 2018-08-02 DIAGNOSIS — G809 Cerebral palsy, unspecified: Secondary | ICD-10-CM | POA: Diagnosis not present

## 2018-08-02 DIAGNOSIS — R21 Rash and other nonspecific skin eruption: Secondary | ICD-10-CM | POA: Diagnosis not present

## 2018-08-02 DIAGNOSIS — E11628 Type 2 diabetes mellitus with other skin complications: Secondary | ICD-10-CM | POA: Diagnosis not present

## 2018-08-02 DIAGNOSIS — G4089 Other seizures: Secondary | ICD-10-CM | POA: Diagnosis not present

## 2018-08-02 DIAGNOSIS — E119 Type 2 diabetes mellitus without complications: Secondary | ICD-10-CM | POA: Diagnosis not present

## 2018-08-09 DIAGNOSIS — E119 Type 2 diabetes mellitus without complications: Secondary | ICD-10-CM | POA: Diagnosis not present

## 2018-08-09 DIAGNOSIS — Z794 Long term (current) use of insulin: Secondary | ICD-10-CM | POA: Diagnosis not present

## 2018-08-09 DIAGNOSIS — R21 Rash and other nonspecific skin eruption: Secondary | ICD-10-CM | POA: Diagnosis not present

## 2018-08-09 DIAGNOSIS — G809 Cerebral palsy, unspecified: Secondary | ICD-10-CM | POA: Diagnosis not present

## 2018-08-09 DIAGNOSIS — G4089 Other seizures: Secondary | ICD-10-CM | POA: Diagnosis not present

## 2018-08-09 DIAGNOSIS — E11628 Type 2 diabetes mellitus with other skin complications: Secondary | ICD-10-CM | POA: Diagnosis not present

## 2018-09-15 DIAGNOSIS — M79674 Pain in right toe(s): Secondary | ICD-10-CM | POA: Diagnosis not present

## 2018-09-15 DIAGNOSIS — M79675 Pain in left toe(s): Secondary | ICD-10-CM | POA: Diagnosis not present

## 2018-09-15 DIAGNOSIS — B351 Tinea unguium: Secondary | ICD-10-CM | POA: Diagnosis not present

## 2018-10-14 DIAGNOSIS — G809 Cerebral palsy, unspecified: Secondary | ICD-10-CM | POA: Diagnosis not present

## 2018-10-14 DIAGNOSIS — R569 Unspecified convulsions: Secondary | ICD-10-CM | POA: Diagnosis not present

## 2018-10-14 DIAGNOSIS — E119 Type 2 diabetes mellitus without complications: Secondary | ICD-10-CM | POA: Diagnosis not present

## 2018-10-14 DIAGNOSIS — E785 Hyperlipidemia, unspecified: Secondary | ICD-10-CM | POA: Diagnosis not present

## 2018-10-21 DIAGNOSIS — E119 Type 2 diabetes mellitus without complications: Secondary | ICD-10-CM | POA: Diagnosis not present

## 2018-10-21 DIAGNOSIS — I10 Essential (primary) hypertension: Secondary | ICD-10-CM | POA: Diagnosis not present

## 2018-10-21 DIAGNOSIS — Z794 Long term (current) use of insulin: Secondary | ICD-10-CM | POA: Diagnosis not present

## 2018-10-21 DIAGNOSIS — G40909 Epilepsy, unspecified, not intractable, without status epilepticus: Secondary | ICD-10-CM | POA: Diagnosis not present

## 2018-10-21 DIAGNOSIS — G809 Cerebral palsy, unspecified: Secondary | ICD-10-CM | POA: Diagnosis not present

## 2018-10-25 DIAGNOSIS — Z794 Long term (current) use of insulin: Secondary | ICD-10-CM | POA: Diagnosis not present

## 2018-10-25 DIAGNOSIS — E119 Type 2 diabetes mellitus without complications: Secondary | ICD-10-CM | POA: Diagnosis not present

## 2018-10-25 DIAGNOSIS — G809 Cerebral palsy, unspecified: Secondary | ICD-10-CM | POA: Diagnosis not present

## 2018-10-25 DIAGNOSIS — I10 Essential (primary) hypertension: Secondary | ICD-10-CM | POA: Diagnosis not present

## 2018-10-25 DIAGNOSIS — G40909 Epilepsy, unspecified, not intractable, without status epilepticus: Secondary | ICD-10-CM | POA: Diagnosis not present

## 2018-10-27 DIAGNOSIS — I10 Essential (primary) hypertension: Secondary | ICD-10-CM | POA: Diagnosis not present

## 2018-10-27 DIAGNOSIS — G40909 Epilepsy, unspecified, not intractable, without status epilepticus: Secondary | ICD-10-CM | POA: Diagnosis not present

## 2018-10-27 DIAGNOSIS — Z794 Long term (current) use of insulin: Secondary | ICD-10-CM | POA: Diagnosis not present

## 2018-10-27 DIAGNOSIS — G809 Cerebral palsy, unspecified: Secondary | ICD-10-CM | POA: Diagnosis not present

## 2018-10-27 DIAGNOSIS — E119 Type 2 diabetes mellitus without complications: Secondary | ICD-10-CM | POA: Diagnosis not present

## 2018-10-28 ENCOUNTER — Emergency Department (HOSPITAL_COMMUNITY): Payer: Medicare Other

## 2018-10-28 ENCOUNTER — Encounter (HOSPITAL_COMMUNITY): Payer: Self-pay

## 2018-10-28 ENCOUNTER — Inpatient Hospital Stay (HOSPITAL_COMMUNITY)
Admission: EM | Admit: 2018-10-28 | Discharge: 2018-10-31 | DRG: 305 | Disposition: A | Discharge status: Home Health | Payer: Medicare Other | Attending: Pulmonary Disease | Admitting: Pulmonary Disease

## 2018-10-28 DIAGNOSIS — Z993 Dependence on wheelchair: Secondary | ICD-10-CM

## 2018-10-28 DIAGNOSIS — I160 Hypertensive urgency: Principal | ICD-10-CM | POA: Diagnosis present

## 2018-10-28 DIAGNOSIS — R32 Unspecified urinary incontinence: Secondary | ICD-10-CM | POA: Diagnosis present

## 2018-10-28 DIAGNOSIS — R569 Unspecified convulsions: Secondary | ICD-10-CM

## 2018-10-28 DIAGNOSIS — J9811 Atelectasis: Secondary | ICD-10-CM | POA: Diagnosis not present

## 2018-10-28 DIAGNOSIS — Z03818 Encounter for observation for suspected exposure to other biological agents ruled out: Secondary | ICD-10-CM | POA: Diagnosis not present

## 2018-10-28 DIAGNOSIS — H518 Other specified disorders of binocular movement: Secondary | ICD-10-CM | POA: Diagnosis not present

## 2018-10-28 DIAGNOSIS — H547 Unspecified visual loss: Secondary | ICD-10-CM | POA: Diagnosis not present

## 2018-10-28 DIAGNOSIS — Z7951 Long term (current) use of inhaled steroids: Secondary | ICD-10-CM

## 2018-10-28 DIAGNOSIS — R51 Headache: Secondary | ICD-10-CM | POA: Diagnosis not present

## 2018-10-28 DIAGNOSIS — K5900 Constipation, unspecified: Secondary | ICD-10-CM | POA: Diagnosis present

## 2018-10-28 DIAGNOSIS — Z209 Contact with and (suspected) exposure to unspecified communicable disease: Secondary | ICD-10-CM | POA: Diagnosis not present

## 2018-10-28 DIAGNOSIS — E119 Type 2 diabetes mellitus without complications: Secondary | ICD-10-CM | POA: Diagnosis not present

## 2018-10-28 DIAGNOSIS — E785 Hyperlipidemia, unspecified: Secondary | ICD-10-CM | POA: Diagnosis present

## 2018-10-28 DIAGNOSIS — I10 Essential (primary) hypertension: Secondary | ICD-10-CM | POA: Diagnosis not present

## 2018-10-28 DIAGNOSIS — Z79899 Other long term (current) drug therapy: Secondary | ICD-10-CM

## 2018-10-28 DIAGNOSIS — R9431 Abnormal electrocardiogram [ECG] [EKG]: Secondary | ICD-10-CM | POA: Diagnosis not present

## 2018-10-28 DIAGNOSIS — G809 Cerebral palsy, unspecified: Secondary | ICD-10-CM | POA: Diagnosis present

## 2018-10-28 DIAGNOSIS — Z794 Long term (current) use of insulin: Secondary | ICD-10-CM | POA: Diagnosis not present

## 2018-10-28 DIAGNOSIS — G40909 Epilepsy, unspecified, not intractable, without status epilepticus: Secondary | ICD-10-CM | POA: Diagnosis present

## 2018-10-28 DIAGNOSIS — H538 Other visual disturbances: Secondary | ICD-10-CM | POA: Diagnosis not present

## 2018-10-28 DIAGNOSIS — Z1159 Encounter for screening for other viral diseases: Secondary | ICD-10-CM

## 2018-10-28 HISTORY — DX: Chronic gingivitis, plaque induced: K05.10

## 2018-10-28 LAB — COMPREHENSIVE METABOLIC PANEL
ALT: 25 U/L (ref 0–44)
AST: 28 U/L (ref 15–41)
Albumin: 4.1 g/dL (ref 3.5–5.0)
Alkaline Phosphatase: 119 U/L (ref 38–126)
Anion gap: 8 (ref 5–15)
BUN: 14 mg/dL (ref 8–23)
CO2: 25 mmol/L (ref 22–32)
Calcium: 8.9 mg/dL (ref 8.9–10.3)
Chloride: 105 mmol/L (ref 98–111)
Creatinine, Ser: 0.92 mg/dL (ref 0.61–1.24)
GFR calc Af Amer: >60 mL/min (ref >60)
GFR calc non Af Amer: >60 mL/min (ref >60)
Glucose, Bld: 122 mg/dL — ABNORMAL HIGH (ref 70–99)
Potassium: 4.8 mmol/L (ref 3.5–5.1)
Sodium: 138 mmol/L (ref 135–145)
Total Bilirubin: 0.6 mg/dL (ref 0.3–1.2)
Total Protein: 7.6 g/dL (ref 6.5–8.1)

## 2018-10-28 LAB — CBC
HCT: 43.1 % (ref 39.0–52.0)
Hemoglobin: 13.4 g/dL (ref 13.0–17.0)
MCH: 29.3 pg (ref 26.0–34.0)
MCHC: 31.1 g/dL (ref 30.0–36.0)
MCV: 94.3 fL (ref 80.0–100.0)
Platelets: 156 10*3/uL (ref 150–400)
RBC: 4.57 MIL/uL (ref 4.22–5.81)
RDW: 12.9 % (ref 11.5–15.5)
WBC: 5.6 10*3/uL (ref 4.0–10.5)
nRBC: 0 % (ref 0.0–0.2)

## 2018-10-28 LAB — DIFFERENTIAL
Abs Immature Granulocytes: 0.01 10*3/uL (ref 0.00–0.07)
Basophils Absolute: 0 10*3/uL (ref 0.0–0.1)
Basophils Relative: 0 %
Eosinophils Absolute: 0.2 10*3/uL (ref 0.0–0.5)
Eosinophils Relative: 3 %
Immature Granulocytes: 0 %
Lymphocytes Relative: 35 %
Lymphs Abs: 1.9 10*3/uL (ref 0.7–4.0)
Monocytes Absolute: 0.6 10*3/uL (ref 0.1–1.0)
Monocytes Relative: 10 %
Neutro Abs: 2.9 10*3/uL (ref 1.7–7.7)
Neutrophils Relative %: 52 %

## 2018-10-28 LAB — URINALYSIS, ROUTINE W REFLEX MICROSCOPIC
Bilirubin Urine: NEGATIVE
Glucose, UA: NEGATIVE mg/dL
Hgb urine dipstick: NEGATIVE
Ketones, ur: NEGATIVE mg/dL
Nitrite: NEGATIVE
Protein, ur: 100 mg/dL — AB
Specific Gravity, Urine: 1.014 (ref 1.005–1.030)
pH: 7 (ref 5.0–8.0)

## 2018-10-28 LAB — SARS CORONAVIRUS 2 BY RT PCR (HOSPITAL ORDER, PERFORMED IN ~~LOC~~ HOSPITAL LAB): SARS Coronavirus 2: NEGATIVE

## 2018-10-28 LAB — CARBAMAZEPINE LEVEL, TOTAL: Carbamazepine Lvl: 10.1 ug/mL (ref 4.0–12.0)

## 2018-10-28 LAB — APTT: aPTT: 30 seconds (ref 24–36)

## 2018-10-28 LAB — CBG MONITORING, ED
Glucose-Capillary: 116 mg/dL — ABNORMAL HIGH (ref 70–99)
Glucose-Capillary: 91 mg/dL (ref 70–99)

## 2018-10-28 LAB — PROTIME-INR
INR: 0.9 (ref 0.8–1.2)
Prothrombin Time: 12.5 seconds (ref 11.4–15.2)

## 2018-10-28 MED ORDER — ENOXAPARIN SODIUM 60 MG/0.6ML ~~LOC~~ SOLN
50.0000 mg | SUBCUTANEOUS | Status: DC
  Administered 2018-10-29: 50 mg via SUBCUTANEOUS
  Filled 2018-10-28: qty 0.6

## 2018-10-28 MED ORDER — INSULIN GLARGINE 100 UNIT/ML ~~LOC~~ SOLN
10.0000 [IU] | Freq: Every day | SUBCUTANEOUS | Status: DC
  Administered 2018-10-29 – 2018-10-30 (×3): 10 [IU] via SUBCUTANEOUS
  Filled 2018-10-28 (×4): qty 0.1

## 2018-10-28 MED ORDER — CARBAMAZEPINE 200 MG PO TABS
300.0000 mg | ORAL_TABLET | Freq: Two times a day (BID) | ORAL | Status: DC
  Administered 2018-10-29 – 2018-10-31 (×6): 300 mg via ORAL
  Filled 2018-10-28: qty 1.5
  Filled 2018-10-28 (×4): qty 2
  Filled 2018-10-28 (×6): qty 1.5
  Filled 2018-10-28: qty 2
  Filled 2018-10-28 (×3): qty 1.5
  Filled 2018-10-28: qty 2

## 2018-10-28 MED ORDER — SENNOSIDES-DOCUSATE SODIUM 8.6-50 MG PO TABS
1.0000 | ORAL_TABLET | Freq: Every evening | ORAL | Status: DC | PRN
  Filled 2018-10-28: qty 1

## 2018-10-28 MED ORDER — CHLORHEXIDINE GLUCONATE 0.12 % MT SOLN
15.0000 mL | Freq: Every day | OROMUCOSAL | Status: DC
  Administered 2018-10-29 – 2018-10-31 (×3): 15 mL via OROMUCOSAL
  Filled 2018-10-28 (×7): qty 15

## 2018-10-28 MED ORDER — ONDANSETRON HCL 4 MG PO TABS: 4.0000 mg | ORAL_TABLET | Freq: Four times a day (QID) | ORAL | Status: DC | PRN

## 2018-10-28 MED ORDER — ACETAMINOPHEN 650 MG RE SUPP: 650.0000 mg | Freq: Four times a day (QID) | RECTAL | Status: DC | PRN

## 2018-10-28 MED ORDER — LABETALOL HCL 5 MG/ML IV SOLN: 10.0000 mg | INTRAVENOUS | Status: DC | PRN

## 2018-10-28 MED ORDER — ATORVASTATIN CALCIUM 40 MG PO TABS
40.0000 mg | ORAL_TABLET | Freq: Every day | ORAL | Status: DC
  Administered 2018-10-29 – 2018-10-30 (×3): 40 mg via ORAL
  Filled 2018-10-28 (×3): qty 1

## 2018-10-28 MED ORDER — AMLODIPINE BESYLATE 5 MG PO TABS
5.0000 mg | ORAL_TABLET | Freq: Every day | ORAL | Status: DC
  Administered 2018-10-29 – 2018-10-30 (×2): 5 mg via ORAL
  Filled 2018-10-28 (×3): qty 1

## 2018-10-28 MED ORDER — LABETALOL HCL 5 MG/ML IV SOLN
10.0000 mg | Freq: Once | INTRAVENOUS | Status: AC
  Administered 2018-10-28: 10 mg via INTRAVENOUS
  Filled 2018-10-28: qty 4

## 2018-10-28 MED ORDER — DOCUSATE SODIUM 100 MG PO CAPS
100.0000 mg | ORAL_CAPSULE | Freq: Two times a day (BID) | ORAL | Status: DC
  Administered 2018-10-29 – 2018-10-31 (×6): 100 mg via ORAL
  Filled 2018-10-28 (×6): qty 1

## 2018-10-28 MED ORDER — FLUTICASONE PROPIONATE 50 MCG/ACT NA SUSP
2.0000 | Freq: Every day | NASAL | Status: DC
  Administered 2018-10-29 – 2018-10-31 (×3): 2 via NASAL
  Filled 2018-10-28 (×2): qty 16

## 2018-10-28 MED ORDER — ACETAMINOPHEN 325 MG PO TABS: 650.0000 mg | ORAL_TABLET | Freq: Four times a day (QID) | ORAL | Status: DC | PRN

## 2018-10-28 MED ORDER — INSULIN ASPART 100 UNIT/ML ~~LOC~~ SOLN: 0.0000 [IU] | Freq: Three times a day (TID) | SUBCUTANEOUS | Status: DC

## 2018-10-28 MED ORDER — SODIUM CHLORIDE 0.9% FLUSH
3.0000 mL | Freq: Two times a day (BID) | INTRAVENOUS | Status: DC
  Administered 2018-10-29 – 2018-10-30 (×5): 3 mL via INTRAVENOUS

## 2018-10-28 MED ORDER — ONDANSETRON HCL 4 MG/2ML IJ SOLN: 4.0000 mg | Freq: Four times a day (QID) | INTRAMUSCULAR | Status: DC | PRN

## 2018-10-28 MED ORDER — CHLORHEXIDINE GLUCONATE CLOTH 2 % EX PADS: 6.0000 | MEDICATED_PAD | Freq: Every day | CUTANEOUS | Status: DC

## 2018-10-28 NOTE — H&P (Signed)
History and Physical    Jeremy Parks:[id number] DOB: Feb 08, 1951 DOA: 10/28/2018  PCP: Sinda Du, MD  Patient coming from: [facility]  I have personally briefly reviewed patient's old medical records in [place]  Chief Complaint: Hypertension, blurry vision  HPI: Jeremy Parks is a 68 y.o. male with medical history significant for cerebral palsy, seizure disorder, insulin-dependent diabetes, and hyperlipidemia who presents the ED from [facility] for evaluation of significant hypertension and blurry vision.  History is limited from patient, therefore entirety of history is obtained from EDP and chart review.  Per ED report, patient is essentially wheelchair-bound due to cerebral palsy.  He has chronic right eye deviation laterally.  He has been having progressively increasing blood pressure over the last 2 weeks and reportedly had blurry vision and headache.  Patient has no prior known history of hypertension.  Patient reports occasional constipation and urinary incontinence.  He says he has been having poor vision for some time now which is why he wears glasses.  He otherwise denies any headache, chest pain, dyspnea, nausea, vomiting, abdominal pain.  ED Course:  Initial vitals in the ED showed BP two 2/99, pulse 70, RR 10, temp 97.9 Fahrenheit, SPO2 99% on room air.  Labs are notable for serum glucose 122, carbamazepine level 10.1 (within normal limits), otherwise CMP and CBC are within normal limits. Urinalysis showed negative nitrites, trace leukocytes, 0-5 RBC/hpf, 6-10 WBC/hpf, few bacteria on microscopy.  SARS-CoV-2 test was obtained and pending.  CT head without contrast was negative for acute abnormality, and diffuse stable atrophy was noted.  2 view chest x-ray showed low lung volumes with left base subsegmental atelectasis.  MRI brain without contrast was negative for acute intracranial abnormality.  Advanced  cerebellar and cerebral atrophy with mild chronic small vessel ischemic disease was noted.  Telemetry neurology were consulted and recommended further BP control for management of hypertensive urgency.  Patient was given IV labetalol 10 mg x 1 and the hospitalist service was consulted to admit for further evaluation and management.  Review of Systems:  Unable to obtain full review of systems due to chronic cognitive dysfunction.   Past Medical History:  Diagnosis Date   Cerebral palsy ([place])    Diabetes mellitus without complication ([place])    Gingivitis    Lipoma of abdominal wall    Seizures ([place])     History reviewed. No pertinent surgical history.  Social History:  reports that he has never smoked. He has never used smokeless tobacco. He reports that he does not drink alcohol or use drugs.  No Known Allergies  Family History  Family history unknown: Yes     Prior to Admission medications   Medication Sig Start Date End Date Taking? Authorizing Provider  acetaminophen (TYLENOL) 500 MG tablet Take 500 mg by mouth every 8 (eight) hours.   Yes [provider]  atorvastatin (LIPITOR) 40 MG tablet Take 40 mg by mouth at bedtime.    Yes [provider]  carbamazepine (TEGRETOL) 200 MG tablet Take 1.5 tablets (300 mg total) by mouth 2 (two) times daily. 10/31/16  Yes Forde Dandy, MD  chlorhexidine (PERIDEX) 0.12 % solution Use as directed 15 mLs in the mouth or throat daily. **Swish 15 mls for 30 seconds then spit out. USE DAILY**   Yes [provider]  docusate sodium (COLACE) 100 MG capsule Take 100 mg by mouth 2 (two) times daily.   Yes  [provider]  fluticasone (FLONASE) 50 MCG/ACT nasal spray Place 2 sprays into both nostrils daily.   Yes [provider]  Insulin Glargine (BASAGLAR KWIKPEN [place]) Inject 20 Units into the skin at bedtime.   Yes [provider]  nystatin (MYCOSTATIN/NYSTOP) powder Apply topically 4 (four)  times daily. Apply topically to affected area of groin twice daily as needed for rash.   Yes [provider]    Physical Exam: Vitals:   10/28/18 1947 10/28/18 1955 10/28/18 2030 10/28/18 2100  BP:   (!) 193/101 (!) 186/94  Pulse: 78  74 71  Resp:  17    Temp:  97.9 F (36.6 C)    TempSrc:  Oral    SpO2: 99%  99% 99%    Constitutional: Resting in bed with head elevated, NAD, calm, comfortable Eyes: PERRL, disconjugate gaze and right eye exotropia ENMT: Mucous membranes are moist. Posterior pharynx clear of any exudate or lesions. Neck: normal, supple, no masses. Respiratory: clear to auscultation bilaterally, no wheezing, no crackles. Normal respiratory effort. No accessory muscle use.  Cardiovascular: Regular rate and rhythm, no murmurs / rubs / gallops. No extremity edema. 2+ radial and pedal pulses bilaterally. Abdomen: no tenderness, no masses palpated. No hepatosplenomegaly. Bowel sounds positive.  Musculoskeletal: Good ROM upper extremities but limited against gravity and lower extremities. Normal muscle tone.  Skin: no rashes, lesions, ulcers. No induration Neurologic: Limited as patient only following simple commands with prompting, vision is impaired, not tracking for EOMI testing, sensation intact,Strength 5/5 in upper extremities, 4/5 in both lower extremities.  Psychiatric: Alert and oriented to self but not place or time.    Labs on Admission: I have personally reviewed following labs and imaging studies  CBC: Recent Labs  Lab 10/28/18 1613  WBC 5.6  NEUTROABS 2.9  HGB 13.4  HCT 43.1  MCV 94.3  PLT 267   Basic Metabolic Panel: Recent Labs  Lab 10/28/18 1613  NA 138  K 4.8  CL 105  CO2 25  GLUCOSE 122*  BUN 14  CREATININE 0.92  CALCIUM 8.9   GFR: CrCl cannot be calculated (Unknown ideal weight.). Liver Function Tests: Recent Labs  Lab 10/28/18 1613  AST 28  ALT 25  ALKPHOS 119  BILITOT 0.6  PROT 7.6  ALBUMIN 4.1   No results for  input(s): LIPASE, AMYLASE in the last 168 hours. No results for input(s): AMMONIA in the last 168 hours. Coagulation Profile: Recent Labs  Lab 10/28/18 1717  INR 0.9   Cardiac Enzymes: No results for input(s): CKTOTAL, CKMB, CKMBINDEX, TROPONINI in the last 168 hours. BNP (last 3 results) No results for input(s): PROBNP in the last 8760 hours. HbA1C: No results for input(s): HGBA1C in the last 72 hours. CBG: Recent Labs  Lab 10/28/18 1540  GLUCAP 116*   Lipid Profile: No results for input(s): CHOL, HDL, LDLCALC, TRIG, CHOLHDL, LDLDIRECT in the last 72 hours. Thyroid Function Tests: No results for input(s): TSH, T4TOTAL, FREET4, T3FREE, THYROIDAB in the last 72 hours. Anemia Panel: No results for input(s): VITAMINB12, FOLATE, FERRITIN, TIBC, IRON, RETICCTPCT in the last 72 hours. Urine analysis:    Component Value Date/Time   COLORURINE YELLOW 10/28/2018 1725   APPEARANCEUR HAZY (A) 10/28/2018 1725   LABSPEC 1.014 10/28/2018 1725   PHURINE 7.0 10/28/2018 1725   GLUCOSEU NEGATIVE 10/28/2018 1725   HGBUR NEGATIVE 10/28/2018 [place] 10/28/2018 [place] 10/28/2018 1725   PROTEINUR 100 (A) 10/28/2018 1725  UROBILINOGEN 0.2 07/25/2014 1828   NITRITE NEGATIVE 10/28/2018 1725   LEUKOCYTESUR TRACE (A) 10/28/2018 1725    Radiological Exams on Admission: Dg Chest 2 View  Result Date: 10/28/2018 CLINICAL DATA:  Hypertension blurry vision EXAM: CHEST - 2 VIEW COMPARISON:  08/19/2017 FINDINGS: Low lung volumes. Subsegmental atelectasis left base. Mild cardiomegaly. No pneumothorax. IMPRESSION: Low lung volumes with mild cardiomegaly and subsegmental atelectasis at the left base. Electronically Signed   By: Donavan Foil M.D.   On: 10/28/2018 16:56   Ct Head Wo Contrast  Result Date: 10/28/2018 CLINICAL DATA:  Hypertension.  Blurred vision.  Headache. EXAM: CT HEAD WITHOUT CONTRAST TECHNIQUE: Contiguous axial images were obtained from the base  of the skull through the vertex without intravenous contrast. COMPARISON:  CT scan dated 08/19/2017 FINDINGS: Brain: There is diffuse mild cerebral cortical atrophy and moderate cerebellar atrophy, stable. No hemorrhage, infarction, or mass lesion. No significant fibular dilatation. Vascular: No hyperdense vessel or unexpected calcification. Skull: Normal. Negative for fracture or focal lesion. Sinuses/Orbits: Normal. Other: None. IMPRESSION: No acute abnormality.  Diffuse stable atrophy. Electronically Signed   By: Lorriane Shire M.D.   On: 10/28/2018 16:48   Mr Brain Wo Contrast (neuro Protocol)  Result Date: 10/28/2018 CLINICAL DATA:  Blurry vision and headache.  Hypertension. EXAM: MRI HEAD WITHOUT CONTRAST TECHNIQUE: Multiplanar, multiecho pulse sequences of the brain and surrounding structures were obtained without intravenous contrast. COMPARISON:  Head CT 10/28/2018 FINDINGS: The study is mildly motion degraded. Brain: There is no evidence of acute infarct, mass, midline shift, or extra-axial fluid collection. A remote left periventricular/left thalamic hemorrhage is noted. Bilateral periventricular white matter T2 hyperintensities are nonspecific but compatible with mild chronic small vessel ischemic disease. There is moderate cerebral and moderate to severe cerebellar atrophy with prominent white matter volume loss noted in the periatrial regions. Vascular: Major intracranial vascular flow voids are preserved. Skull and upper cervical spine: No suspicious marrow lesion. Sinuses/Orbits: Unremarkable orbits. Minimal right frontal sinus mucosal thickening. Clear mastoid air cells. Other: None. IMPRESSION: 1. No acute intracranial abnormality. 2. Mild chronic small vessel ischemic disease. 3. Advanced cerebellar and cerebral atrophy. Electronically Signed   By: Logan Bores M.D.   On: 10/28/2018 19:10    EKG: Independently reviewed. Sinus rhythm with early R wave transition, motion artifact present.   Not significantly changed when compared to prior.  Assessment/Plan Principal Problem:   Hypertensive urgency Active Problems:   Cerebral palsy (HCC)   Diabetes mellitus without complication ([place])   Seizures ([place])  Jeremy Parks is a 68 y.o. male with medical history significant for cerebral palsy, seizure disorder, insulin-dependent diabetes, and hyperlipidemia who is admitted for hypertensive urgency.  Hypertensive urgency: Likely cause for reported symptoms of headache and blurry vision.  Patient received IV labetalol 10 mg in the ED with slight improvement in BP.  CT head and MRI brain are without acute abnormality.  Goal SBP is 150-160 over the next 6 hours. -Start amlodipine 5 mg daily, consider addition of ACE/ARB or HCTZ if needed -IV labetalol 10 mg if needed for SBP >180 and/or DBP >110  Insulin-dependent diabetes: On Lantus 20 units nightly and his facility. -Reduced home Lantus 10 units nightly, SSI  Seizure disorder: No active signs/symptoms of seizure.  Tegretol level is appropriate. -Continue Tegretol -Seizure precautions  Hyperlipidemia: -Continue atorvastatin  Cerebral palsy: Chronic and stable.  Return to [facility] at discharge.  DVT prophylaxis: Lovenox Code Status: Full code, no advanced directives per the  nurse at his facility Family Communication: None available, spoke with nurse at his facility.  Unable to reach legal guardian. Disposition Plan: Likely discharge to his previous facility in 1-2 days Consults called: Teleneurology consulted by EDP Admission status: Observation   Zada Finders MD [facility]  If 7PM-7AM, please contact night-coverage [email or web address]  10/28/2018, 9:47 PM  ]

## 2018-10-28 NOTE — ED Notes (Signed)
Neuro consult done

## 2018-10-28 NOTE — ED Notes (Addendum)
Pt and ptt hemolyzed per lab. Will reorder lab for re draw

## 2018-10-28 NOTE — ED Triage Notes (Signed)
Pt resident of [facility].  Reports has had blurred vision, headache, and high bp x 2 weeks.  bp 205/104 with ems.

## 2018-10-28 NOTE — Consult Note (Signed)
[facility] TeleNeurology Consult Services  Stat Consult  Date of Service:   10/28/2018 19:53:51  Impression:     .  Hypertensive Urgency  Comments/Sign-Out: 89 M here with elevated BP with systolic in the 945W, and reported blurry vision. No acute ischemia on MRI brain. Etiology of sx likely hypertensive urgency.  PLAN  - gradual reduction of SBP by <25% mean arterial pressure q6hrs  - no further inpt neuro workup needed - call us back with ?s  ---  CT HEAD: Showed No Acute Hemorrhage or Acute Core Infarct  Metrics: [facility] Notification Time: 10/28/2018 19:52:13 Stamp Time: 10/28/2018 19:53:51 Callback Response Time: 10/28/2018 19:57:57 Video Start Time: 10/28/2018 38:88:28 Video End Time: 10/28/2018 20:21:32   Disposition: Neurology Follow Up Recommended  Sign Out:     .  Discussed with Emergency Department Provider  ----------------------------------------------------------------------------------------------------  Chief Complaint: blurry vision.  History of Present Illness: Patient is a 68 year old Male.  59 M, h/o sz d/o, here with 2 weeks of high BP and blurry vision. Pt has sz d/o at baseline and is unable to provide a coherent history. Nursing home states that pt's BP has been elevated, quoted a measurement of '205/104'. He is unable to tell me why he is in the hospital. He knows his own name and DOB but does not know the current year. When asked if he is in pain or discomfort, he denies it. Per nurse, this is his baseline at the nursing facility. Head CT and brain MRI today shows no evidence of acute abnl.  EXAM  - knows his own name and DOB  - does not know year or age  - can't say why he's in hospital  - follows simple commands with repeated prompting  - pt's speech is mumbled and difficult to understand  - no facial asymmetry  - has dysconjugate gaze at baseline, with R eye exotropia  - no drift in BUE  - intact FTN b/l   - he has slight drift of his LLE and no antigravity movements of his RLE  - denies any numbness  - no tremor noted  ---  Due to the immediate potential for life-threatening deterioration due to underlying acute neurologic illness, I spent 15 minutes providing critical care. This time includes time for face to face visit via telemedicine, review of medical records, imaging studies and discussion of findings with providers, the patient and/or family.   Dr Burtis Junes   [facility] [phone]   Case [id number]

## 2018-10-28 NOTE — ED Provider Notes (Signed)
[facility] EMERGENCY DEPARTMENT Provider Note   CSN: [id number] Arrival date & time: 10/28/18  1521     History   Chief Complaint Chief Complaint  Patient presents with   Hypertension   Blurred Vision    HPI Jeremy Parks is a 68 y.o. male.     The history is provided by the EMS personnel, the nursing home and the patient. The history is limited by the condition of the patient (Hx CP, poor historian).  Hypertension   Pt was seen at 1605. Per EMS, NH report: NH states they sent pt to ED today due to "blurry vision" and "HTN" for the past 2 weeks. EMS states pt's BP was "205/104." NH states pt's right eye is "always for years" deviated laterally. Pt himself just states "my vision is blurry" when asked why he has come to the ED today. Pt wheelchairs at baseline with chronic LE's weakness. Denies CP/SOB, no cough, no fevers, no abd pain, no N/V/D, no new focal motor weakness, no eye pain, no injury.    Past Medical History:  Diagnosis Date   Cerebral palsy ([employer])    Diabetes mellitus without complication ([place])    Gingivitis    Lipoma of abdominal wall    Seizures ([place])     There are no active problems to display for this patient.   History reviewed. No pertinent surgical history.      Home Medications    Prior to Admission medications   Medication Sig Start Date End Date Taking? Authorizing Provider  atorvastatin (LIPITOR) 40 MG tablet Take 40 mg by mouth daily.    [provider]  carbamazepine (TEGRETOL) 200 MG tablet Take 1.5 tablets (300 mg total) by mouth 2 (two) times daily. 10/31/16   Forde Dandy, MD  chlorhexidine (PERIDEX) 0.12 % solution Use as directed 15 mLs in the mouth or throat daily. **Swish 15 mls for 30 seconds then spit out. USE DAILY**    [provider]  docusate sodium (COLACE) 100 MG capsule Take 100 mg by mouth 2 (two) times daily.    [provider]  Insulin Glargine (BASAGLAR KWIKPEN [place]) Inject 20 Units into  the skin at bedtime.    [provider]  Insulin Pen Needle (NOVOFINE) 30G X 8 MM MISC Inject 1 packet into the skin as needed.    [provider]  nystatin (MYCOSTATIN/NYSTOP) powder Apply topically 4 (four) times daily. Apply topically to affected area of groin twice daily as needed for rash.    [provider]    Family History No family history on file.  Social History Social History   Tobacco Use   Smoking status: Never Smoker   Smokeless tobacco: Never Used  Substance Use Topics   Alcohol use: No   Drug use: No     Allergies   Patient has no known allergies.   Review of Systems Review of Systems  Unable to perform ROS: Other     Physical Exam Updated Vital Signs BP (!) 202/99 (BP Location: Left Arm)    Pulse 75    Temp 97.9 F (36.6 C) (Oral)    Resp 18    SpO2 97%    Patient Vitals for the past 24 hrs:  BP Temp Temp src Pulse Resp SpO2  10/28/18 2100 (!) 186/94 -- -- 71 -- 99 %  10/28/18 2030 (!) 193/101 -- -- 74 -- 99 %  10/28/18 1955 -- 97.9 F (36.6 C) Oral -- 17 --  10/28/18 1947 -- -- -- 78 -- 99 %  10/28/18 1946 (!) 195/94 -- -- -- -- --  10/28/18 1800 -- -- -- 73 16 97 %  10/28/18 1745 -- -- -- 73 18 99 %  10/28/18 1730 (!) 200/108 -- -- 76 13 98 %  10/28/18 1724 (!) 184/98 97.8 F (36.6 C) Oral 80 14 100 %  10/28/18 1715 -- -- -- -- 11 --  10/28/18 1700 -- -- -- -- 13 --  10/28/18 1645 -- -- -- -- 16 --  10/28/18 1630 -- -- -- 68 (!) 0 97 %  10/28/18 1615 -- -- -- 70 -- 100 %  10/28/18 1600 -- -- -- 70 10 99 %  10/28/18 1545 -- -- -- -- 16 --  10/28/18 1531 (!) 202/99 97.9 F (36.6 C) Oral 75 18 97 %     Physical Exam 1610: Physical examination:  Nursing notes reviewed; Vital signs and O2 SAT reviewed;  Constitutional: Well developed, Well nourished, Well hydrated, In no acute distress; Head:  Normocephalic, atraumatic; Eyes: +right eye deviated laterally. No conjunctival injection. No obvious hyphema or  hypopyon. EOMI, PERRL, No scleral icterus; ENMT: Mouth and pharynx normal, Mucous membranes moist; Neck: Supple, Full range of motion, No lymphadenopathy; Cardiovascular: Regular rate and rhythm, No gallop; Respiratory: Breath sounds clear & equal bilaterally, No wheezes.  Speaking full sentences with ease, Normal respiratory effort/excursion; Chest: Nontender, Movement normal; Abdomen: Soft, Nontender, Nondistended, Normal bowel sounds; Genitourinary: No CVA tenderness; Extremities: Peripheral pulses normal, No tenderness, No edema, No calf edema or asymmetry.; Neuro: Awake, alert, drowsy at times. Confused re: recent events. No facial droop. Speech baseline. Grips equal. Strength 5/5 equal bilat UE's, 2-3/5 strength bilat LE's per baseline. Otherwise no gross focal motor deficits in extremities.; Skin: Color normal, Warm, Dry.    ED Treatments / Results  Labs (all labs ordered are listed, but only abnormal results are displayed)   EKG EKG Interpretation  Date/Time:  Friday October 28 2018 15:34:23 EDT Ventricular Rate:  71 PR Interval:    QRS Duration: 105 QT Interval:  418 QTC Calculation: 455 R Axis:   32 Text Interpretation:  Sinus rhythm Abnormal R-wave progression, early transition Artifact When compared with ECG of 08/19/2017 No significant change was found Confirmed by Francine Graven [phone]) on 10/28/2018 4:21:34 PM   Radiology   Procedures Procedures (including critical care time)  Medications Ordered in ED Medications - No data to display   Initial Impression / Assessment and Plan / ED Course  I have reviewed the triage vital signs and the nursing notes.  Pertinent labs & imaging results that were available during my care of the patient were reviewed by me and considered in my medical decision making (see chart for details).     MDM Reviewed: previous chart, nursing note and vitals Reviewed previous: labs and ECG Interpretation: labs, ECG, x-ray, CT scan and  MRI Total time providing critical care: 30-74 minutes. This excludes time spent performing separately reportable procedures and services. Consults: admitting MD and neurology   CRITICAL CARE Performed by: Francine Graven Total critical care time: 40 minutes Critical care time was exclusive of separately billable procedures and treating other patients. Critical care was necessary to treat or prevent imminent or life-threatening deterioration. Critical care was time spent personally by me on the following activities: development of treatment plan with patient and/or surrogate as well as nursing, discussions with consultants, evaluation of patient's response to treatment, examination of patient, obtaining history from patient  or surrogate, ordering and performing treatments and interventions, ordering and review of laboratory studies, ordering and review of radiographic studies, pulse oximetry and re-evaluation of patient's condition.   Results for orders placed or performed during the hospital encounter of 10/28/18  SARS Coronavirus 2 (CEPHEID - Performed in [place] hospital lab), [facility] Order   Specimen: Nasopharyngeal Swab  Result Value Ref Range   SARS Coronavirus 2 NEGATIVE NEGATIVE  CBC  Result Value Ref Range   WBC 5.6 4.0 - 10.5 K/uL   RBC 4.57 4.22 - 5.81 MIL/uL   Hemoglobin 13.4 13.0 - 17.0 g/dL   HCT 43.1 39.0 - 52.0 %   MCV 94.3 80.0 - 100.0 fL   MCH 29.3 26.0 - 34.0 pg   MCHC 31.1 30.0 - 36.0 g/dL   RDW 12.9 11.5 - 15.5 %   Platelets 156 150 - 400 K/uL   nRBC 0.0 0.0 - 0.2 %  Differential  Result Value Ref Range   Neutrophils Relative % 52 %   Neutro Abs 2.9 1.7 - 7.7 K/uL   Lymphocytes Relative 35 %   Lymphs Abs 1.9 0.7 - 4.0 K/uL   Monocytes Relative 10 %   Monocytes Absolute 0.6 0.1 - 1.0 K/uL   Eosinophils Relative 3 %   Eosinophils Absolute 0.2 0.0 - 0.5 K/uL   Basophils Relative 0 %   Basophils Absolute 0.0 0.0 - 0.1 K/uL   Immature Granulocytes 0 %   Abs  Immature Granulocytes 0.01 0.00 - 0.07 K/uL  Comprehensive metabolic panel  Result Value Ref Range   Sodium 138 135 - 145 mmol/L   Potassium 4.8 3.5 - 5.1 mmol/L   Chloride 105 98 - 111 mmol/L   CO2 25 22 - 32 mmol/L   Glucose, Bld 122 (H) 70 - 99 mg/dL   BUN 14 8 - 23 mg/dL   Creatinine, Ser 0.92 0.61 - 1.24 mg/dL   Calcium 8.9 8.9 - 10.3 mg/dL   Total Protein 7.6 6.5 - 8.1 g/dL   Albumin 4.1 3.5 - 5.0 g/dL   AST 28 15 - 41 U/L   ALT 25 0 - 44 U/L   Alkaline Phosphatase 119 38 - 126 U/L   Total Bilirubin 0.6 0.3 - 1.2 mg/dL   GFR calc non Af Amer >60 >60 mL/min   GFR calc Af Amer >60 >60 mL/min   Anion gap 8 5 - 15  Urinalysis, Routine w reflex microscopic  Result Value Ref Range   Color, Urine YELLOW YELLOW   APPearance HAZY (A) CLEAR   Specific Gravity, Urine 1.014 1.005 - 1.030   pH 7.0 5.0 - 8.0   Glucose, UA NEGATIVE NEGATIVE mg/dL   Hgb urine dipstick NEGATIVE NEGATIVE   Bilirubin Urine NEGATIVE NEGATIVE   Ketones, ur NEGATIVE NEGATIVE mg/dL   Protein, ur 100 (A) NEGATIVE mg/dL   Nitrite NEGATIVE NEGATIVE   Leukocytes,Ua TRACE (A) NEGATIVE   RBC / HPF 0-5 0 - 5 RBC/hpf   WBC, UA 6-10 0 - 5 WBC/hpf   Bacteria, UA FEW (A) NONE SEEN   Squamous Epithelial / LPF 0-5 0 - 5   Mucus PRESENT   Carbamazepine level, total  Result Value Ref Range   Carbamazepine Lvl 10.1 4.0 - 12.0 ug/mL  Protime-INR  Result Value Ref Range   Prothrombin Time 12.5 11.4 - 15.2 seconds   INR 0.9 0.8 - 1.2  APTT  Result Value Ref Range   aPTT 30 24 - 36 seconds  CBG monitoring, ED  Result Value Ref Range   Glucose-Capillary 116 (H) 70 - 99 mg/dL  CBG monitoring, ED  Result Value Ref Range   Glucose-Capillary 91 70 - 99 mg/dL   Dg Chest 2 View Result Date: 10/28/2018 CLINICAL DATA:  Hypertension blurry vision EXAM: CHEST - 2 VIEW COMPARISON:  08/19/2017 FINDINGS: Low lung volumes. Subsegmental atelectasis left base. Mild cardiomegaly. No pneumothorax. IMPRESSION: Low lung volumes with  mild cardiomegaly and subsegmental atelectasis at the left base. Electronically Signed   By: Donavan Foil M.D.   On: 10/28/2018 16:56   Ct Head Wo Contrast Result Date: 10/28/2018 CLINICAL DATA:  Hypertension.  Blurred vision.  Headache. EXAM: CT HEAD WITHOUT CONTRAST TECHNIQUE: Contiguous axial images were obtained from the base of the skull through the vertex without intravenous contrast. COMPARISON:  CT scan dated 08/19/2017 FINDINGS: Brain: There is diffuse mild cerebral cortical atrophy and moderate cerebellar atrophy, stable. No hemorrhage, infarction, or mass lesion. No significant fibular dilatation. Vascular: No hyperdense vessel or unexpected calcification. Skull: Normal. Negative for fracture or focal lesion. Sinuses/Orbits: Normal. Other: None. IMPRESSION: No acute abnormality.  Diffuse stable atrophy. Electronically Signed   By: Lorriane Shire M.D.   On: 10/28/2018 16:48   Mr Brain Wo Contrast (neuro Protocol) Result Date: 10/28/2018 CLINICAL DATA:  Blurry vision and headache.  Hypertension. EXAM: MRI HEAD WITHOUT CONTRAST TECHNIQUE: Multiplanar, multiecho pulse sequences of the brain and surrounding structures were obtained without intravenous contrast. COMPARISON:  Head CT 10/28/2018 FINDINGS: The study is mildly motion degraded. Brain: There is no evidence of acute infarct, mass, midline shift, or extra-axial fluid collection. A remote left periventricular/left thalamic hemorrhage is noted. Bilateral periventricular white matter T2 hyperintensities are nonspecific but compatible with mild chronic small vessel ischemic disease. There is moderate cerebral and moderate to severe cerebellar atrophy with prominent white matter volume loss noted in the periatrial regions. Vascular: Major intracranial vascular flow voids are preserved. Skull and upper cervical spine: No suspicious marrow lesion. Sinuses/Orbits: Unremarkable orbits. Minimal right frontal sinus mucosal thickening. Clear mastoid air  cells. Other: None. IMPRESSION: 1. No acute intracranial abnormality. 2. Mild chronic small vessel ischemic disease. 3. Advanced cerebellar and cerebral atrophy. Electronically Signed   By: Logan Bores M.D.   On: 10/28/2018 19:10    KEIJUAN SCHELLHASE was evaluated in Emergency Department on 10/28/2018 for the symptoms described in the history of present illness. He was evaluated in the context of the global COVID-19 pandemic, which necessitated consideration that the patient might be at risk for infection with the SARS-CoV-2 virus that causes COVID-19. Institutional protocols and algorithms that pertain to the evaluation of patients at risk for COVID-19 are in a state of rapid change based on information released by regulatory bodies including the CDC and federal and state organizations. These policies and algorithms were followed during the patient's care in the ED.      2025:  Pt challenging historian. Pt does not know why he is here. Several calls to NH by ED RN: pt apparently with "worse HTN" today. Unclear what pt means by "blurry vision;" if I hold one finger in front of his left eye, he tells me "it's 6" and if I hold 2 fingers in front of his right eye he tells me "7 or maybe 5."  T/C returned from [facility] Dr. Nicoletta Dress, case discussed, including:  HPI, pertinent PM/SHx, VS/PE, dx testing, ED course and treatment:  Agrees pt challenging historian, MRI brain without acute stroke is reassuring, admit for tx hypertensive  urgency. IV labetalol ordered.   2050:  T/C returned from [facility] Dr. Posey Pronto, case discussed, including:  HPI, pertinent PM/SHx, VS/PE, dx testing, ED course and treatment, as well as d/w Tele Neuro MD:  Agreeable to admit.     Final Clinical Impressions(s) / ED Diagnoses   Final diagnoses:  None    ED Discharge Orders    None       Francine Graven, DO 11/01/18 [phone]

## 2018-10-28 NOTE — ED Notes (Signed)
EDP questioning if right eye has always deviated laterally. Spoke with staff from [facility] who is familiar with pt. Reports that right eye has been that way for years

## 2018-10-28 NOTE — ED Notes (Signed)
Received report at 1915 from [place]. Pt has urinated on self. Pt stated came to fast. Pt and linens cleaned. Pt a/o. Denies pain. States still has blurred vision. "a little" dizzy

## 2018-10-29 ENCOUNTER — Other Ambulatory Visit: Payer: Self-pay

## 2018-10-29 LAB — GLUCOSE, CAPILLARY
Glucose-Capillary: 116 mg/dL — ABNORMAL HIGH (ref 70–99)
Glucose-Capillary: 118 mg/dL — ABNORMAL HIGH (ref 70–99)
Glucose-Capillary: 80 mg/dL (ref 70–99)
Glucose-Capillary: 88 mg/dL (ref 70–99)
Glucose-Capillary: 92 mg/dL (ref 70–99)

## 2018-10-29 LAB — BASIC METABOLIC PANEL
Anion gap: 10 (ref 5–15)
BUN: 13 mg/dL (ref 8–23)
CO2: 25 mmol/L (ref 22–32)
Calcium: 9.1 mg/dL (ref 8.9–10.3)
Chloride: 105 mmol/L (ref 98–111)
Creatinine, Ser: 0.79 mg/dL (ref 0.61–1.24)
GFR calc Af Amer: >60 mL/min (ref >60)
GFR calc non Af Amer: >60 mL/min (ref >60)
Glucose, Bld: 84 mg/dL (ref 70–99)
Potassium: 3.8 mmol/L (ref 3.5–5.1)
Sodium: 140 mmol/L (ref 135–145)

## 2018-10-29 LAB — HEMOGLOBIN A1C
Hgb A1c MFr Bld: 5.6 % (ref 4.8–5.6)
Mean Plasma Glucose: 114.02 mg/dL

## 2018-10-29 LAB — URINE CULTURE: Culture: >=100000 — AB

## 2018-10-29 LAB — CBC
HCT: 39.9 % (ref 39.0–52.0)
Hemoglobin: 12.9 g/dL — ABNORMAL LOW (ref 13.0–17.0)
MCH: 28.5 pg (ref 26.0–34.0)
MCHC: 32.3 g/dL (ref 30.0–36.0)
MCV: 88.1 fL (ref 80.0–100.0)
Platelets: 183 10*3/uL (ref 150–400)
RBC: 4.53 MIL/uL (ref 4.22–5.81)
RDW: 12.5 % (ref 11.5–15.5)
WBC: 6.3 10*3/uL (ref 4.0–10.5)
nRBC: 0 % (ref 0.0–0.2)

## 2018-10-29 LAB — MRSA PCR SCREENING: MRSA by PCR: NEGATIVE

## 2018-10-29 MED ORDER — ENOXAPARIN SODIUM 40 MG/0.4ML ~~LOC~~ SOLN
40.0000 mg | SUBCUTANEOUS | Status: DC
  Administered 2018-10-29 – 2018-10-30 (×2): 40 mg via SUBCUTANEOUS
  Filled 2018-10-29 (×2): qty 0.4

## 2018-10-29 NOTE — Progress Notes (Signed)
Left message for Jolene Schimke at [facility] to inform of pt being admitted to hospital.

## 2018-10-29 NOTE — Progress Notes (Addendum)
Subjective: He was admitted yesterday with hypertensive urgency.  I received a call from his home health nurse and she said that his blood pressure was over 250 systolic and over 037 diastolic and that he had complained of changes in his vision.  He has not had high blood pressure in the past.  Previous blood pressures per the assisted living facility were normal.  He says he feels better this morning.  His blood pressure is better.  He had tele-neurology consultation and did not appear to be having a stroke.  Objective: Vital signs in last 24 hours: Temp:  [97.3 F (36.3 C)-98.3 F (36.8 C)] 98.3 F (36.8 C) (06/27 0545) Pulse Rate:  [66-80] 73 (06/27 0645) Resp:  [0-20] 15 (06/27 0645) BP: (104-202)/(57-108) 120/69 (06/27 0630) SpO2:  [93 %-100 %] 98 % (06/27 0645) Weight:  [76.2 kg-104.8 kg] 76.9 kg (06/27 0345) Weight change:  Last BM Date: 10/28/18  Intake/Output from previous day: 06/26 0701 - 06/27 0700 In: -  Out: 250 [Urine:250]  PHYSICAL EXAM General appearance: alert Resp: clear to auscultation bilaterally Cardio: regular rate and rhythm, S1, S2 normal, no murmur, click, rub or gallop GI: soft, non-tender; bowel sounds normal; no masses,  no organomegaly Extremities: extremities normal, atraumatic, no cyanosis or edema  Lab Results:  Results for orders placed or performed during the hospital encounter of 10/28/18 (from the past 48 hour(s))  CBG monitoring, ED     Status: Abnormal   Collection Time: 10/28/18  3:40 PM  Result Value Ref Range   Glucose-Capillary 116 (H) 70 - 99 mg/dL  CBC     Status: None   Collection Time: 10/28/18  4:13 PM  Result Value Ref Range   WBC 5.6 4.0 - 10.5 K/uL   RBC 4.57 4.22 - 5.81 MIL/uL   Hemoglobin 13.4 13.0 - 17.0 g/dL   HCT 43.1 39.0 - 52.0 %   MCV 94.3 80.0 - 100.0 fL   MCH 29.3 26.0 - 34.0 pg   MCHC 31.1 30.0 - 36.0 g/dL   RDW 12.9 11.5 - 15.5 %   Platelets 156 150 - 400 K/uL   nRBC 0.0 0.0 - 0.2 %    Comment: Performed  at [facility], [address] [place], [place] [address]  Differential     Status: None   Collection Time: 10/28/18  4:13 PM  Result Value Ref Range   Neutrophils Relative % 52 %   Neutro Abs 2.9 1.7 - 7.7 K/uL   Lymphocytes Relative 35 %   Lymphs Abs 1.9 0.7 - 4.0 K/uL   Monocytes Relative 10 %   Monocytes Absolute 0.6 0.1 - 1.0 K/uL   Eosinophils Relative 3 %   Eosinophils Absolute 0.2 0.0 - 0.5 K/uL   Basophils Relative 0 %   Basophils Absolute 0.0 0.0 - 0.1 K/uL   Immature Granulocytes 0 %   Abs Immature Granulocytes 0.01 0.00 - 0.07 K/uL    Comment: Performed at [facility], [address], [place], [place] [address]  Comprehensive metabolic panel     Status: Abnormal   Collection Time: 10/28/18  4:13 PM  Result Value Ref Range   Sodium 138 135 - 145 mmol/L   Potassium 4.8 3.5 - 5.1 mmol/L   Chloride 105 98 - 111 mmol/L   CO2 25 22 - 32 mmol/L   Glucose, Bld 122 (H) 70 - 99 mg/dL   BUN 14 8 - 23 mg/dL   Creatinine, Ser 0.92 0.61 - 1.24 mg/dL  Calcium 8.9 8.9 - 10.3 mg/dL   Total Protein 7.6 6.5 - 8.1 g/dL   Albumin 4.1 3.5 - 5.0 g/dL   AST 28 15 - 41 U/L   ALT 25 0 - 44 U/L   Alkaline Phosphatase 119 38 - 126 U/L   Total Bilirubin 0.6 0.3 - 1.2 mg/dL   GFR calc non Af Amer >60 >60 mL/min   GFR calc Af Amer >60 >60 mL/min   Anion gap 8 5 - 15    Comment: Performed at [facility], [address] [place], [place] [address]  Carbamazepine level, total     Status: None   Collection Time: 10/28/18  5:17 PM  Result Value Ref Range   Carbamazepine Lvl 10.1 4.0 - 12.0 ug/mL    Comment: Performed at [facility], [address] [place], [place] [address]  Protime-INR     Status: None   Collection Time: 10/28/18  5:17 PM  Result Value Ref Range   Prothrombin Time 12.5 11.4 - 15.2 seconds   INR 0.9 0.8 - 1.2    Comment: (NOTE) INR goal varies based on device and disease states. Performed at [facility], [address], [place], [place] [address]   APTT     Status:  None   Collection Time: 10/28/18  5:17 PM  Result Value Ref Range   aPTT 30 24 - 36 seconds    Comment: Performed at [facility], [address] [place], [place] [address]  Urinalysis, Routine w reflex microscopic     Status: Abnormal   Collection Time: 10/28/18  5:25 PM  Result Value Ref Range   Color, Urine YELLOW YELLOW   APPearance HAZY (A) CLEAR   Specific Gravity, Urine 1.014 1.005 - 1.030   pH 7.0 5.0 - 8.0   Glucose, UA NEGATIVE NEGATIVE mg/dL   Hgb urine dipstick NEGATIVE NEGATIVE   Bilirubin Urine NEGATIVE NEGATIVE   Ketones, ur NEGATIVE NEGATIVE mg/dL   Protein, ur 100 (A) NEGATIVE mg/dL   Nitrite NEGATIVE NEGATIVE   Leukocytes,Ua TRACE (A) NEGATIVE   RBC / HPF 0-5 0 - 5 RBC/hpf   WBC, UA 6-10 0 - 5 WBC/hpf   Bacteria, UA FEW (A) NONE SEEN   Squamous Epithelial / LPF 0-5 0 - 5   Mucus PRESENT     Comment: Performed at [facility], [address] [place], [place] [address]  SARS Coronavirus 2 (CEPHEID - Performed in [place] hospital lab), Hosp Order     Status: None   Collection Time: 10/28/18  8:53 PM   Specimen: Nasopharyngeal Swab  Result Value Ref Range   SARS Coronavirus 2 NEGATIVE NEGATIVE    Comment: (NOTE) If result is NEGATIVE SARS-CoV-2 target nucleic acids are NOT DETECTED. The SARS-CoV-2 RNA is generally detectable in upper and lower  respiratory specimens during the acute phase of infection. The lowest  concentration of SARS-CoV-2 viral copies this assay can detect is 250  copies / mL. A negative result does not preclude SARS-CoV-2 infection  and should not be used as the sole basis for treatment or other  patient management decisions.  A negative result may occur with  improper specimen collection / handling, submission of specimen other  than nasopharyngeal swab, presence of viral mutation(s) within the  areas targeted by this assay, and inadequate number of viral copies  (<250 copies / mL). A negative result must be combined with clinical   observations, patient history, and epidemiological information. If result is POSITIVE SARS-CoV-2 target  nucleic acids are DETECTED. The SARS-CoV-2 RNA is generally detectable in upper and lower  respiratory specimens dur ing the acute phase of infection.  Positive  results are indicative of active infection with SARS-CoV-2.  Clinical  correlation with patient history and other diagnostic information is  necessary to determine patient infection status.  Positive results do  not rule out bacterial infection or co-infection with other viruses. If result is PRESUMPTIVE POSTIVE SARS-CoV-2 nucleic acids MAY BE PRESENT.   A presumptive positive result was obtained on the submitted specimen  and confirmed on repeat testing.  While 2019 novel coronavirus  (SARS-CoV-2) nucleic acids may be present in the submitted sample  additional confirmatory testing may be necessary for epidemiological  and / or clinical management purposes  to differentiate between  SARS-CoV-2 and other Sarbecovirus currently known to infect humans.  If clinically indicated additional testing with an alternate test  methodology [phone]) is advised. The SARS-CoV-2 RNA is generally  detectable in upper and lower respiratory sp ecimens during the acute  phase of infection. The expected result is Negative. Fact Sheet for Patients:  StrictlyIdeas.no Fact Sheet for Healthcare Providers: BankingDealers.co.za This test is not yet approved or cleared by the [place] FDA and has been authorized for detection and/or diagnosis of SARS-CoV-2 by FDA under an Emergency Use Authorization (EUA).  This EUA will remain in effect (meaning this test can be used) for the duration of the COVID-19 declaration under Section 564(b)(1) of the Act, 21 U.S.C. section 360bbb-3(b)(1), unless the authorization is terminated or revoked sooner. Performed at [facility], [address]  [place], [place] [address]   CBG monitoring, ED     Status: None   Collection Time: 10/28/18  9:57 PM  Result Value Ref Range   Glucose-Capillary 91 70 - 99 mg/dL  MRSA PCR Screening     Status: None   Collection Time: 10/28/18 11:28 PM   Specimen: Nasal Mucosa; Nasopharyngeal  Result Value Ref Range   MRSA by PCR NEGATIVE NEGATIVE    Comment:        The GeneXpert MRSA Assay (FDA approved for NASAL specimens only), is one component of a comprehensive MRSA colonization surveillance program. It is not intended to diagnose MRSA infection nor to guide or monitor treatment for MRSA infections. Performed at [facility], [address] [place], [place] [address]   Glucose, capillary     Status: None   Collection Time: 10/29/18 12:02 AM  Result Value Ref Range   Glucose-Capillary 88 70 - 99 mg/dL   Comment 1 Notify RN   Basic metabolic panel     Status: None   Collection Time: 10/29/18  4:53 AM  Result Value Ref Range   Sodium 140 135 - 145 mmol/L   Potassium 3.8 3.5 - 5.1 mmol/L    Comment: DELTA CHECK NOTED   Chloride 105 98 - 111 mmol/L   CO2 25 22 - 32 mmol/L   Glucose, Bld 84 70 - 99 mg/dL   BUN 13 8 - 23 mg/dL   Creatinine, Ser 0.79 0.61 - 1.24 mg/dL   Calcium 9.1 8.9 - 10.3 mg/dL   GFR calc non Af Amer >60 >60 mL/min   GFR calc Af Amer >60 >60 mL/min   Anion gap 10 5 - 15    Comment: Performed at [facility], [address] [place], [place] [address]  CBC     Status: Abnormal   Collection Time: 10/29/18  4:53 AM  Result Value Ref Range  WBC 6.3 4.0 - 10.5 K/uL   RBC 4.53 4.22 - 5.81 MIL/uL   Hemoglobin 12.9 (L) 13.0 - 17.0 g/dL   HCT 39.9 39.0 - 52.0 %   MCV 88.1 80.0 - 100.0 fL   MCH 28.5 26.0 - 34.0 pg   MCHC 32.3 30.0 - 36.0 g/dL   RDW 12.5 11.5 - 15.5 %   Platelets 183 150 - 400 K/uL   nRBC 0.0 0.0 - 0.2 %    Comment: Performed at [facility], [address] [place], [place] [address]  Glucose, capillary     Status: None   Collection Time: 10/29/18  8:14 AM   Result Value Ref Range   Glucose-Capillary 80 70 - 99 mg/dL    ABGS No results for input(s): PHART, PO2ART, TCO2, HCO3 in the last 72 hours.  Invalid input(s): PCO2 CULTURES Recent Results (from the past 240 hour(s))  SARS Coronavirus 2 (CEPHEID - Performed in [place] hospital lab), Hosp Order     Status: None   Collection Time: 10/28/18  8:53 PM   Specimen: Nasopharyngeal Swab  Result Value Ref Range Status   SARS Coronavirus 2 NEGATIVE NEGATIVE Final    Comment: (NOTE) If result is NEGATIVE SARS-CoV-2 target nucleic acids are NOT DETECTED. The SARS-CoV-2 RNA is generally detectable in upper and lower  respiratory specimens during the acute phase of infection. The lowest  concentration of SARS-CoV-2 viral copies this assay can detect is 250  copies / mL. A negative result does not preclude SARS-CoV-2 infection  and should not be used as the sole basis for treatment or other  patient management decisions.  A negative result may occur with  improper specimen collection / handling, submission of specimen other  than nasopharyngeal swab, presence of viral mutation(s) within the  areas targeted by this assay, and inadequate number of viral copies  (<250 copies / mL). A negative result must be combined with clinical  observations, patient history, and epidemiological information. If result is POSITIVE SARS-CoV-2 target nucleic acids are DETECTED. The SARS-CoV-2 RNA is generally detectable in upper and lower  respiratory specimens dur ing the acute phase of infection.  Positive  results are indicative of active infection with SARS-CoV-2.  Clinical  correlation with patient history and other diagnostic information is  necessary to determine patient infection status.  Positive results do  not rule out bacterial infection or co-infection with other viruses. If result is PRESUMPTIVE POSTIVE SARS-CoV-2 nucleic acids MAY BE PRESENT.   A presumptive positive result was obtained on  the submitted specimen  and confirmed on repeat testing.  While 2019 novel coronavirus  (SARS-CoV-2) nucleic acids may be present in the submitted sample  additional confirmatory testing may be necessary for epidemiological  and / or clinical management purposes  to differentiate between  SARS-CoV-2 and other Sarbecovirus currently known to infect humans.  If clinically indicated additional testing with an alternate test  methodology [id number]) is advised. The SARS-CoV-2 RNA is generally  detectable in upper and lower respiratory sp ecimens during the acute  phase of infection. The expected result is Negative. Fact Sheet for Patients:  StrictlyIdeas.no Fact Sheet for Healthcare Providers: BankingDealers.co.za This test is not yet approved or cleared by the [place] FDA and has been authorized for detection and/or diagnosis of SARS-CoV-2 by FDA under an Emergency Use Authorization (EUA).  This EUA will remain in effect (meaning this test can be used) for the duration of the COVID-19 declaration under Section 564(b)(1) of the Act,  21 U.S.C. section 360bbb-3(b)(1), unless the authorization is terminated or revoked sooner. Performed at [facility], [address] [place], [place] [address]   MRSA PCR Screening     Status: None   Collection Time: 10/28/18 11:28 PM   Specimen: Nasal Mucosa; Nasopharyngeal  Result Value Ref Range Status   MRSA by PCR NEGATIVE NEGATIVE Final    Comment:        The GeneXpert MRSA Assay (FDA approved for NASAL specimens only), is one component of a comprehensive MRSA colonization surveillance program. It is not intended to diagnose MRSA infection nor to guide or monitor treatment for MRSA infections. Performed at [facility], [address], [place], [place] [address]    Studies/Results: Dg Chest 2 View  Result Date: 10/28/2018 CLINICAL DATA:  Hypertension blurry vision EXAM: CHEST - 2 VIEW  COMPARISON:  08/19/2017 FINDINGS: Low lung volumes. Subsegmental atelectasis left base. Mild cardiomegaly. No pneumothorax. IMPRESSION: Low lung volumes with mild cardiomegaly and subsegmental atelectasis at the left base. Electronically Signed   By: Donavan Foil M.D.   On: 10/28/2018 16:56   Ct Head Wo Contrast  Result Date: 10/28/2018 CLINICAL DATA:  Hypertension.  Blurred vision.  Headache. EXAM: CT HEAD WITHOUT CONTRAST TECHNIQUE: Contiguous axial images were obtained from the base of the skull through the vertex without intravenous contrast. COMPARISON:  CT scan dated 08/19/2017 FINDINGS: Brain: There is diffuse mild cerebral cortical atrophy and moderate cerebellar atrophy, stable. No hemorrhage, infarction, or mass lesion. No significant fibular dilatation. Vascular: No hyperdense vessel or unexpected calcification. Skull: Normal. Negative for fracture or focal lesion. Sinuses/Orbits: Normal. Other: None. IMPRESSION: No acute abnormality.  Diffuse stable atrophy. Electronically Signed   By: Lorriane Shire M.D.   On: 10/28/2018 16:48   Mr Brain Wo Contrast (neuro Protocol)  Result Date: 10/28/2018 CLINICAL DATA:  Blurry vision and headache.  Hypertension. EXAM: MRI HEAD WITHOUT CONTRAST TECHNIQUE: Multiplanar, multiecho pulse sequences of the brain and surrounding structures were obtained without intravenous contrast. COMPARISON:  Head CT 10/28/2018 FINDINGS: The study is mildly motion degraded. Brain: There is no evidence of acute infarct, mass, midline shift, or extra-axial fluid collection. A remote left periventricular/left thalamic hemorrhage is noted. Bilateral periventricular white matter T2 hyperintensities are nonspecific but compatible with mild chronic small vessel ischemic disease. There is moderate cerebral and moderate to severe cerebellar atrophy with prominent white matter volume loss noted in the periatrial regions. Vascular: Major intracranial vascular flow voids are preserved. Skull  and upper cervical spine: No suspicious marrow lesion. Sinuses/Orbits: Unremarkable orbits. Minimal right frontal sinus mucosal thickening. Clear mastoid air cells. Other: None. IMPRESSION: 1. No acute intracranial abnormality. 2. Mild chronic small vessel ischemic disease. 3. Advanced cerebellar and cerebral atrophy. Electronically Signed   By: Logan Bores M.D.   On: 10/28/2018 19:10    Medications:  Prior to Admission:  Medications Prior to Admission  Medication Sig Dispense Refill Last Dose  . acetaminophen (TYLENOL) 500 MG tablet Take 500 mg by mouth every 8 (eight) hours.   10/28/2018 at Unknown time  . atorvastatin (LIPITOR) 40 MG tablet Take 40 mg by mouth at bedtime.    10/27/2018 at Unknown time  . carbamazepine (TEGRETOL) 200 MG tablet Take 1.5 tablets (300 mg total) by mouth 2 (two) times daily. 100 tablet 0 10/28/2018 at 800  . chlorhexidine (PERIDEX) 0.12 % solution Use as directed 15 mLs in the mouth or throat daily. **Swish 15 mls for 30 seconds then spit out. USE DAILY**  10/28/2018 at Unknown time  . docusate sodium (COLACE) 100 MG capsule Take 100 mg by mouth 2 (two) times daily.   10/28/2018 at Unknown time  . fluticasone (FLONASE) 50 MCG/ACT nasal spray Place 2 sprays into both nostrils daily.   10/28/2018 at Unknown time  . Insulin Glargine (BASAGLAR KWIKPEN [place]) Inject 20 Units into the skin at bedtime.   10/27/2018 at Unknown time  . nystatin (MYCOSTATIN/NYSTOP) powder Apply topically 4 (four) times daily. Apply topically to affected area of groin twice daily as needed for rash.   unknown   Scheduled: . amLODipine  5 mg Oral Daily  . atorvastatin  40 mg Oral QHS  . carbamazepine  300 mg Oral BID  . chlorhexidine  15 mL Mouth/Throat Daily  . Chlorhexidine Gluconate Cloth  6 each Topical Q0600  . docusate sodium  100 mg Oral BID  . enoxaparin (LOVENOX) injection  50 mg Subcutaneous Q24H  . fluticasone  2 spray Each Nare Daily  . insulin aspart  0-9 Units Subcutaneous TID WC   . insulin glargine  10 Units Subcutaneous QHS  . sodium chloride flush  3 mL Intravenous Q12H   Continuous:  CWU:[id number] **OR** acetaminophen, labetalol, ondansetron **OR** ondansetron (ZOFRAN) IV, senna-docusate  Assesment: He was admitted with hypertensive urgency.  He is better.  He has cerebral palsy at baseline and communication is fairly difficult even at baseline.  He has diabetes and that is been well controlled  He has seizure disorder but no seizures recently Principal Problem:   Hypertensive urgency Active Problems:   Cerebral palsy ([place])   Diabetes mellitus without complication ([place])   Seizures ([place])    Plan: Continue current treatments.  1 more day in stepdown to be sure that he is truly controlled    LOS: 0 days   Alonza Bogus 10/29/2018, 8:55 AM

## 2018-10-30 DIAGNOSIS — H518 Other specified disorders of binocular movement: Secondary | ICD-10-CM | POA: Diagnosis present

## 2018-10-30 DIAGNOSIS — E119 Type 2 diabetes mellitus without complications: Secondary | ICD-10-CM | POA: Diagnosis present

## 2018-10-30 DIAGNOSIS — Z794 Long term (current) use of insulin: Secondary | ICD-10-CM | POA: Diagnosis not present

## 2018-10-30 DIAGNOSIS — H547 Unspecified visual loss: Secondary | ICD-10-CM | POA: Diagnosis present

## 2018-10-30 DIAGNOSIS — K5900 Constipation, unspecified: Secondary | ICD-10-CM | POA: Diagnosis present

## 2018-10-30 DIAGNOSIS — I10 Essential (primary) hypertension: Secondary | ICD-10-CM | POA: Diagnosis present

## 2018-10-30 DIAGNOSIS — R279 Unspecified lack of coordination: Secondary | ICD-10-CM | POA: Diagnosis not present

## 2018-10-30 DIAGNOSIS — Z79899 Other long term (current) drug therapy: Secondary | ICD-10-CM | POA: Diagnosis not present

## 2018-10-30 DIAGNOSIS — Z7951 Long term (current) use of inhaled steroids: Secondary | ICD-10-CM | POA: Diagnosis not present

## 2018-10-30 DIAGNOSIS — G809 Cerebral palsy, unspecified: Secondary | ICD-10-CM | POA: Diagnosis present

## 2018-10-30 DIAGNOSIS — I160 Hypertensive urgency: Secondary | ICD-10-CM | POA: Diagnosis present

## 2018-10-30 DIAGNOSIS — R32 Unspecified urinary incontinence: Secondary | ICD-10-CM | POA: Diagnosis present

## 2018-10-30 DIAGNOSIS — Z993 Dependence on wheelchair: Secondary | ICD-10-CM | POA: Diagnosis not present

## 2018-10-30 DIAGNOSIS — Z743 Need for continuous supervision: Secondary | ICD-10-CM | POA: Diagnosis not present

## 2018-10-30 DIAGNOSIS — Z1159 Encounter for screening for other viral diseases: Secondary | ICD-10-CM | POA: Diagnosis not present

## 2018-10-30 DIAGNOSIS — E785 Hyperlipidemia, unspecified: Secondary | ICD-10-CM | POA: Diagnosis present

## 2018-10-30 DIAGNOSIS — G40909 Epilepsy, unspecified, not intractable, without status epilepticus: Secondary | ICD-10-CM | POA: Diagnosis present

## 2018-10-30 DIAGNOSIS — Z209 Contact with and (suspected) exposure to unspecified communicable disease: Secondary | ICD-10-CM | POA: Diagnosis not present

## 2018-10-30 DIAGNOSIS — R5381 Other malaise: Secondary | ICD-10-CM | POA: Diagnosis not present

## 2018-10-30 LAB — BASIC METABOLIC PANEL
Anion gap: 9 (ref 5–15)
BUN: 15 mg/dL (ref 8–23)
CO2: 22 mmol/L (ref 22–32)
Calcium: 8.5 mg/dL — ABNORMAL LOW (ref 8.9–10.3)
Chloride: 105 mmol/L (ref 98–111)
Creatinine, Ser: 0.86 mg/dL (ref 0.61–1.24)
GFR calc Af Amer: >60 mL/min (ref >60)
GFR calc non Af Amer: >60 mL/min (ref >60)
Glucose, Bld: 93 mg/dL (ref 70–99)
Potassium: 3.9 mmol/L (ref 3.5–5.1)
Sodium: 136 mmol/L (ref 135–145)

## 2018-10-30 LAB — HIV ANTIBODY (ROUTINE TESTING W REFLEX): HIV Screen 4th Generation wRfx: NONREACTIVE

## 2018-10-30 LAB — GLUCOSE, CAPILLARY
Glucose-Capillary: 83 mg/dL (ref 70–99)
Glucose-Capillary: 114 mg/dL — ABNORMAL HIGH (ref 70–99)
Glucose-Capillary: 94 mg/dL (ref 70–99)
Glucose-Capillary: 104 mg/dL — ABNORMAL HIGH (ref 70–99)

## 2018-10-30 NOTE — Progress Notes (Signed)
Subjective: He was admitted with hypertensive urgency.  He is doing much better now.  There is a recorded respiratory rate of 3 which is in error.  He is arousable but sleepy.  Objective: Vital signs in last 24 hours: Temp:  [97.7 F (36.5 C)-98.3 F (36.8 C)] 98.3 F (36.8 C) (06/28 0400) Pulse Rate:  [68-80] 70 (06/28 0600) Resp:  [3-19] 8 (06/28 0600) BP: (96-165)/(57-97) 137/70 (06/28 0600) SpO2:  [92 %-100 %] 97 % (06/28 0600) Weight:  [77.3 kg] 77.3 kg (06/28 0500) Weight change: -27.5 kg Last BM Date: 10/28/18  Intake/Output from previous day: 06/27 0701 - 06/28 0700 In: -  Out: 725 [Urine:725]  PHYSICAL EXAM General appearance: alert, cooperative and no distress Resp: clear to auscultation bilaterally Cardio: regular rate and rhythm, S1, S2 normal, no murmur, click, rub or gallop GI: soft, non-tender; bowel sounds normal; no masses,  no organomegaly Extremities: He has cerebral palsy  Lab Results:  Results for orders placed or performed during the hospital encounter of 10/28/18 (from the past 48 hour(s))  CBG monitoring, ED     Status: Abnormal   Collection Time: 10/28/18  3:40 PM  Result Value Ref Range   Glucose-Capillary 116 (H) 70 - 99 mg/dL  CBC     Status: None   Collection Time: 10/28/18  4:13 PM  Result Value Ref Range   WBC 5.6 4.0 - 10.5 K/uL   RBC 4.57 4.22 - 5.81 MIL/uL   Hemoglobin 13.4 13.0 - 17.0 g/dL   HCT 43.1 39.0 - 52.0 %   MCV 94.3 80.0 - 100.0 fL   MCH 29.3 26.0 - 34.0 pg   MCHC 31.1 30.0 - 36.0 g/dL   RDW 12.9 11.5 - 15.5 %   Platelets 156 150 - 400 K/uL   nRBC 0.0 0.0 - 0.2 %    Comment: Performed at [facility], [address] [place], [place] [address]  Differential     Status: None   Collection Time: 10/28/18  4:13 PM  Result Value Ref Range   Neutrophils Relative % 52 %   Neutro Abs 2.9 1.7 - 7.7 K/uL   Lymphocytes Relative 35 %   Lymphs Abs 1.9 0.7 - 4.0 K/uL   Monocytes Relative 10 %   Monocytes Absolute 0.6 0.1 - 1.0  K/uL   Eosinophils Relative 3 %   Eosinophils Absolute 0.2 0.0 - 0.5 K/uL   Basophils Relative 0 %   Basophils Absolute 0.0 0.0 - 0.1 K/uL   Immature Granulocytes 0 %   Abs Immature Granulocytes 0.01 0.00 - 0.07 K/uL    Comment: Performed at [facility], [address] [place], [place] [address]  Comprehensive metabolic panel     Status: Abnormal   Collection Time: 10/28/18  4:13 PM  Result Value Ref Range   Sodium 138 135 - 145 mmol/L   Potassium 4.8 3.5 - 5.1 mmol/L   Chloride 105 98 - 111 mmol/L   CO2 25 22 - 32 mmol/L   Glucose, Bld 122 (H) 70 - 99 mg/dL   BUN 14 8 - 23 mg/dL   Creatinine, Ser 0.92 0.61 - 1.24 mg/dL   Calcium 8.9 8.9 - 10.3 mg/dL   Total Protein 7.6 6.5 - 8.1 g/dL   Albumin 4.1 3.5 - 5.0 g/dL   AST 28 15 - 41 U/L   ALT 25 0 - 44 U/L   Alkaline Phosphatase 119 38 - 126 U/L   Total Bilirubin 0.6 0.3 - 1.2 mg/dL  GFR calc non Af Amer >60 >60 mL/min   GFR calc Af Amer >60 >60 mL/min   Anion gap 8 5 - 15    Comment: Performed at [facility], [address], [place], [place] [address]  Carbamazepine level, total     Status: None   Collection Time: 10/28/18  5:17 PM  Result Value Ref Range   Carbamazepine Lvl 10.1 4.0 - 12.0 ug/mL    Comment: Performed at [facility], [address] [place], [place] [address]  Protime-INR     Status: None   Collection Time: 10/28/18  5:17 PM  Result Value Ref Range   Prothrombin Time 12.5 11.4 - 15.2 seconds   INR 0.9 0.8 - 1.2    Comment: (NOTE) INR goal varies based on device and disease states. Performed at [facility], [address] [place], [place] [address]   APTT     Status: None   Collection Time: 10/28/18  5:17 PM  Result Value Ref Range   aPTT 30 24 - 36 seconds    Comment: Performed at [facility], [address] [place], [place] [address]  Urinalysis, Routine w reflex microscopic     Status: Abnormal   Collection Time: 10/28/18  5:25 PM  Result Value Ref Range   Color, Urine YELLOW YELLOW    APPearance HAZY (A) CLEAR   Specific Gravity, Urine 1.014 1.005 - 1.030   pH 7.0 5.0 - 8.0   Glucose, UA NEGATIVE NEGATIVE mg/dL   Hgb urine dipstick NEGATIVE NEGATIVE   Bilirubin Urine NEGATIVE NEGATIVE   Ketones, ur NEGATIVE NEGATIVE mg/dL   Protein, ur 100 (A) NEGATIVE mg/dL   Nitrite NEGATIVE NEGATIVE   Leukocytes,Ua TRACE (A) NEGATIVE   RBC / HPF 0-5 0 - 5 RBC/hpf   WBC, UA 6-10 0 - 5 WBC/hpf   Bacteria, UA FEW (A) NONE SEEN   Squamous Epithelial / LPF 0-5 0 - 5   Mucus PRESENT     Comment: Performed at [facility], [address], [place], [place] [address]  Urine culture     Status: Abnormal   Collection Time: 10/28/18  5:25 PM   Specimen: Urine, Clean Catch  Result Value Ref Range   Specimen Description      URINE, CLEAN CATCH Performed at [facility], [address] [place], [place] [address]    Special Requests      NONE Performed at [facility], [address] [place], [place] [address]    Culture >=100,000 COLONIES/mL STREPTOCOCCUS GROUP C (A)    Report Status 10/29/2018 FINAL   SARS Coronavirus 2 (CEPHEID - Performed in [place] hospital lab), Hosp Order     Status: None   Collection Time: 10/28/18  8:53 PM   Specimen: Nasopharyngeal Swab  Result Value Ref Range   SARS Coronavirus 2 NEGATIVE NEGATIVE    Comment: (NOTE) If result is NEGATIVE SARS-CoV-2 target nucleic acids are NOT DETECTED. The SARS-CoV-2 RNA is generally detectable in upper and lower  respiratory specimens during the acute phase of infection. The lowest  concentration of SARS-CoV-2 viral copies this assay can detect is 250  copies / mL. A negative result does not preclude SARS-CoV-2 infection  and should not be used as the sole basis for treatment or other  patient management decisions.  A negative result may occur with  improper specimen collection / handling, submission of specimen other  than nasopharyngeal swab, presence of viral mutation(s) within the  areas targeted by this assay,  and inadequate number of viral copies  (<250 copies / mL). A negative result must be combined with clinical  observations, patient history, and epidemiological information. If result is POSITIVE SARS-CoV-2 target nucleic acids are DETECTED. The SARS-CoV-2 RNA is generally detectable in upper and lower  respiratory specimens dur ing the acute phase of infection.  Positive  results are indicative of active infection with SARS-CoV-2.  Clinical  correlation with patient history and other diagnostic information is  necessary to determine patient infection status.  Positive results do  not rule out bacterial infection or co-infection with other viruses. If result is PRESUMPTIVE POSTIVE SARS-CoV-2 nucleic acids MAY BE PRESENT.   A presumptive positive result was obtained on the submitted specimen  and confirmed on repeat testing.  While 2019 novel coronavirus  (SARS-CoV-2) nucleic acids may be present in the submitted sample  additional confirmatory testing may be necessary for epidemiological  and / or clinical management purposes  to differentiate between  SARS-CoV-2 and other Sarbecovirus currently known to infect humans.  If clinically indicated additional testing with an alternate test  methodology [phone]) is advised. The SARS-CoV-2 RNA is generally  detectable in upper and lower respiratory sp ecimens during the acute  phase of infection. The expected result is Negative. Fact Sheet for Patients:  StrictlyIdeas.no Fact Sheet for Healthcare Providers: BankingDealers.co.za This test is not yet approved or cleared by the [place] FDA and has been authorized for detection and/or diagnosis of SARS-CoV-2 by FDA under an Emergency Use Authorization (EUA).  This EUA will remain in effect (meaning this test can be used) for the duration of the COVID-19 declaration under Section 564(b)(1) of the Act, 21 U.S.C. section 360bbb-3(b)(1), unless  the authorization is terminated or revoked sooner. Performed at [facility], [address], [place], [place] [address]   CBG monitoring, ED     Status: None   Collection Time: 10/28/18  9:57 PM  Result Value Ref Range   Glucose-Capillary 91 70 - 99 mg/dL  MRSA PCR Screening     Status: None   Collection Time: 10/28/18 11:28 PM   Specimen: Nasal Mucosa; Nasopharyngeal  Result Value Ref Range   MRSA by PCR NEGATIVE NEGATIVE    Comment:        The GeneXpert MRSA Assay (FDA approved for NASAL specimens only), is one component of a comprehensive MRSA colonization surveillance program. It is not intended to diagnose MRSA infection nor to guide or monitor treatment for MRSA infections. Performed at [facility], [address] [place], [place] [address]   Glucose, capillary     Status: None   Collection Time: 10/29/18 12:02 AM  Result Value Ref Range   Glucose-Capillary 88 70 - 99 mg/dL   Comment 1 Notify RN   Basic metabolic panel     Status: None   Collection Time: 10/29/18  4:53 AM  Result Value Ref Range   Sodium 140 135 - 145 mmol/L   Potassium 3.8 3.5 - 5.1 mmol/L    Comment: DELTA CHECK NOTED   Chloride 105 98 - 111 mmol/L   CO2 25 22 - 32 mmol/L   Glucose, Bld 84 70 - 99 mg/dL   BUN 13 8 - 23 mg/dL   Creatinine, Ser 0.79 0.61 - 1.24 mg/dL   Calcium 9.1 8.9 - 10.3 mg/dL   GFR calc non Af Amer >60 >60 mL/min   GFR calc Af Amer >60 >60 mL/min   Anion gap 10 5 - 15    Comment:  Performed at [facility], [address] [place], [place] [address]  CBC     Status: Abnormal   Collection Time: 10/29/18  4:53 AM  Result Value Ref Range   WBC 6.3 4.0 - 10.5 K/uL   RBC 4.53 4.22 - 5.81 MIL/uL   Hemoglobin 12.9 (L) 13.0 - 17.0 g/dL   HCT 39.9 39.0 - 52.0 %   MCV 88.1 80.0 - 100.0 fL   MCH 28.5 26.0 - 34.0 pg   MCHC 32.3 30.0 - 36.0 g/dL   RDW 12.5 11.5 - 15.5 %   Platelets 183 150 - 400 K/uL   nRBC 0.0 0.0 - 0.2 %    Comment: Performed at [facility], [address] [place], [place] [address]  Hemoglobin A1c     Status: None   Collection Time: 10/29/18  4:53 AM  Result Value Ref Range   Hgb A1c MFr Bld 5.6 4.8 - 5.6 %    Comment: (NOTE) Pre diabetes:          5.7%-6.4% Diabetes:              >6.4% Glycemic control for   <7.0% adults with diabetes    Mean Plasma Glucose 114.02 mg/dL    Comment: Performed at [facility] [address], [place], [place] [address]  Glucose, capillary     Status: None   Collection Time: 10/29/18  8:14 AM  Result Value Ref Range   Glucose-Capillary 80 70 - 99 mg/dL  Glucose, capillary     Status: None   Collection Time: 10/29/18 11:48 AM  Result Value Ref Range   Glucose-Capillary 92 70 - 99 mg/dL  Glucose, capillary     Status: Abnormal   Collection Time: 10/29/18  5:15 PM  Result Value Ref Range   Glucose-Capillary 118 (H) 70 - 99 mg/dL  Glucose, capillary     Status: Abnormal   Collection Time: 10/29/18  9:21 PM  Result Value Ref Range   Glucose-Capillary 116 (H) 70 - 99 mg/dL  Basic metabolic panel     Status: Abnormal   Collection Time: 10/30/18  5:10 AM  Result Value Ref Range   Sodium 136 135 - 145 mmol/L   Potassium 3.9 3.5 - 5.1 mmol/L   Chloride 105 98 - 111 mmol/L   CO2 22 22 - 32 mmol/L   Glucose, Bld 93 70 - 99 mg/dL   BUN 15 8 - 23 mg/dL   Creatinine, Ser 0.86 0.61 - 1.24 mg/dL   Calcium 8.5 (L) 8.9 - 10.3 mg/dL   GFR calc non Af Amer >60 >60 mL/min   GFR calc Af Amer >60 >60 mL/min   Anion gap 9 5 - 15    Comment: Performed at [facility], [address] [place], [place] [address]    ABGS No results for input(s): PHART, PO2ART, TCO2, HCO3 in the last 72 hours.  Invalid input(s): PCO2 CULTURES Recent Results (from the past 240 hour(s))  Urine culture     Status: Abnormal   Collection Time: 10/28/18  5:25 PM   Specimen: Urine, Clean Catch  Result Value Ref Range Status   Specimen Description   Final    URINE, CLEAN CATCH Performed at [facility], [address],  [place], [place] [address]    Special Requests   Final    NONE Performed at [facility], [address], [place],  [address]    Culture >=100,000 COLONIES/mL STREPTOCOCCUS GROUP C (A)  Final  Report Status 10/29/2018 FINAL  Final  SARS Coronavirus 2 (CEPHEID - Performed in [place] hospital lab), Hosp Order     Status: None   Collection Time: 10/28/18  8:53 PM   Specimen: Nasopharyngeal Swab  Result Value Ref Range Status   SARS Coronavirus 2 NEGATIVE NEGATIVE Final    Comment: (NOTE) If result is NEGATIVE SARS-CoV-2 target nucleic acids are NOT DETECTED. The SARS-CoV-2 RNA is generally detectable in upper and lower  respiratory specimens during the acute phase of infection. The lowest  concentration of SARS-CoV-2 viral copies this assay can detect is 250  copies / mL. A negative result does not preclude SARS-CoV-2 infection  and should not be used as the sole basis for treatment or other  patient management decisions.  A negative result may occur with  improper specimen collection / handling, submission of specimen other  than nasopharyngeal swab, presence of viral mutation(s) within the  areas targeted by this assay, and inadequate number of viral copies  (<250 copies / mL). A negative result must be combined with clinical  observations, patient history, and epidemiological information. If result is POSITIVE SARS-CoV-2 target nucleic acids are DETECTED. The SARS-CoV-2 RNA is generally detectable in upper and lower  respiratory specimens dur ing the acute phase of infection.  Positive  results are indicative of active infection with SARS-CoV-2.  Clinical  correlation with patient history and other diagnostic information is  necessary to determine patient infection status.  Positive results do  not rule out bacterial infection or co-infection with other viruses. If result is PRESUMPTIVE POSTIVE SARS-CoV-2 nucleic acids MAY BE PRESENT.   A presumptive positive result was  obtained on the submitted specimen  and confirmed on repeat testing.  While 2019 novel coronavirus  (SARS-CoV-2) nucleic acids may be present in the submitted sample  additional confirmatory testing may be necessary for epidemiological  and / or clinical management purposes  to differentiate between  SARS-CoV-2 and other Sarbecovirus currently known to infect humans.  If clinically indicated additional testing with an alternate test  methodology [phone]) is advised. The SARS-CoV-2 RNA is generally  detectable in upper and lower respiratory sp ecimens during the acute  phase of infection. The expected result is Negative. Fact Sheet for Patients:  StrictlyIdeas.no Fact Sheet for Healthcare Providers: BankingDealers.co.za This test is not yet approved or cleared by the [place] FDA and has been authorized for detection and/or diagnosis of SARS-CoV-2 by FDA under an Emergency Use Authorization (EUA).  This EUA will remain in effect (meaning this test can be used) for the duration of the COVID-19 declaration under Section 564(b)(1) of the Act, 21 U.S.C. section 360bbb-3(b)(1), unless the authorization is terminated or revoked sooner. Performed at [facility], [address] [place], [place] [address]   MRSA PCR Screening     Status: None   Collection Time: 10/28/18 11:28 PM   Specimen: Nasal Mucosa; Nasopharyngeal  Result Value Ref Range Status   MRSA by PCR NEGATIVE NEGATIVE Final    Comment:        The GeneXpert MRSA Assay (FDA approved for NASAL specimens only), is one component of a comprehensive MRSA colonization surveillance program. It is not intended to diagnose MRSA infection nor to guide or monitor treatment for MRSA infections. Performed at [facility], [address], [place], [place] [address]    Studies/Results: Dg Chest 2 View  Result Date: 10/28/2018 CLINICAL DATA:  Hypertension blurry vision EXAM: CHEST - 2  VIEW COMPARISON:  08/19/2017 FINDINGS:  Low lung volumes. Subsegmental atelectasis left base. Mild cardiomegaly. No pneumothorax. IMPRESSION: Low lung volumes with mild cardiomegaly and subsegmental atelectasis at the left base. Electronically Signed   By: Donavan Foil M.D.   On: 10/28/2018 16:56   Ct Head Wo Contrast  Result Date: 10/28/2018 CLINICAL DATA:  Hypertension.  Blurred vision.  Headache. EXAM: CT HEAD WITHOUT CONTRAST TECHNIQUE: Contiguous axial images were obtained from the base of the skull through the vertex without intravenous contrast. COMPARISON:  CT scan dated 08/19/2017 FINDINGS: Brain: There is diffuse mild cerebral cortical atrophy and moderate cerebellar atrophy, stable. No hemorrhage, infarction, or mass lesion. No significant fibular dilatation. Vascular: No hyperdense vessel or unexpected calcification. Skull: Normal. Negative for fracture or focal lesion. Sinuses/Orbits: Normal. Other: None. IMPRESSION: No acute abnormality.  Diffuse stable atrophy. Electronically Signed   By: Lorriane Shire M.D.   On: 10/28/2018 16:48   Mr Brain Wo Contrast (neuro Protocol)  Result Date: 10/28/2018 CLINICAL DATA:  Blurry vision and headache.  Hypertension. EXAM: MRI HEAD WITHOUT CONTRAST TECHNIQUE: Multiplanar, multiecho pulse sequences of the brain and surrounding structures were obtained without intravenous contrast. COMPARISON:  Head CT 10/28/2018 FINDINGS: The study is mildly motion degraded. Brain: There is no evidence of acute infarct, mass, midline shift, or extra-axial fluid collection. A remote left periventricular/left thalamic hemorrhage is noted. Bilateral periventricular white matter T2 hyperintensities are nonspecific but compatible with mild chronic small vessel ischemic disease. There is moderate cerebral and moderate to severe cerebellar atrophy with prominent white matter volume loss noted in the periatrial regions. Vascular: Major intracranial vascular flow voids are preserved.  Skull and upper cervical spine: No suspicious marrow lesion. Sinuses/Orbits: Unremarkable orbits. Minimal right frontal sinus mucosal thickening. Clear mastoid air cells. Other: None. IMPRESSION: 1. No acute intracranial abnormality. 2. Mild chronic small vessel ischemic disease. 3. Advanced cerebellar and cerebral atrophy. Electronically Signed   By: Logan Bores M.D.   On: 10/28/2018 19:10    Medications:  Prior to Admission:  Medications Prior to Admission  Medication Sig Dispense Refill Last Dose  . acetaminophen (TYLENOL) 500 MG tablet Take 500 mg by mouth every 8 (eight) hours.   10/28/2018 at Unknown time  . atorvastatin (LIPITOR) 40 MG tablet Take 40 mg by mouth at bedtime.    10/27/2018 at Unknown time  . carbamazepine (TEGRETOL) 200 MG tablet Take 1.5 tablets (300 mg total) by mouth 2 (two) times daily. 100 tablet 0 10/28/2018 at 800  . chlorhexidine (PERIDEX) 0.12 % solution Use as directed 15 mLs in the mouth or throat daily. **Swish 15 mls for 30 seconds then spit out. USE DAILY**   10/28/2018 at Unknown time  . docusate sodium (COLACE) 100 MG capsule Take 100 mg by mouth 2 (two) times daily.   10/28/2018 at Unknown time  . fluticasone (FLONASE) 50 MCG/ACT nasal spray Place 2 sprays into both nostrils daily.   10/28/2018 at Unknown time  . Insulin Glargine (BASAGLAR KWIKPEN [place]) Inject 20 Units into the skin at bedtime.   10/27/2018 at Unknown time  . nystatin (MYCOSTATIN/NYSTOP) powder Apply topically 4 (four) times daily. Apply topically to affected area of groin twice daily as needed for rash.   unknown   Scheduled: . amLODipine  5 mg Oral Daily  . atorvastatin  40 mg Oral QHS  . carbamazepine  300 mg Oral BID  . chlorhexidine  15 mL Mouth/Throat Daily  . Chlorhexidine Gluconate Cloth  6 each Topical Q0600  . docusate sodium  100 mg Oral  BID  . enoxaparin (LOVENOX) injection  40 mg Subcutaneous Q24H  . fluticasone  2 spray Each Nare Daily  . insulin aspart  0-9 Units Subcutaneous TID  WC  . insulin glargine  10 Units Subcutaneous QHS  . sodium chloride flush  3 mL Intravenous Q12H   Continuous:  [id number] **OR** acetaminophen, labetalol, ondansetron **OR** ondansetron (ZOFRAN) IV, senna-docusate  Assesment: He was admitted with hypertensive urgency.  He is much better.  We will plan to transfer out of stepdown and probable discharge in the morning.  He has cerebral palsy at baseline and has leg braces and has difficulty with ambulation.  He has diabetes which is stable  He has seizure disorder but no seizures recently Principal Problem:   Hypertensive urgency Active Problems:   Cerebral palsy ([place])   Diabetes mellitus without complication ([place])   Seizures ([place])    Plan: Transfer out of stepdown.  Probable discharge tomorrow    LOS: 0 days   Alonza Bogus 10/30/2018, 7:36 AM

## 2018-10-31 LAB — GLUCOSE, CAPILLARY
Glucose-Capillary: 71 mg/dL (ref 70–99)
Glucose-Capillary: 79 mg/dL (ref 70–99)

## 2018-10-31 MED ORDER — AMLODIPINE BESYLATE 10 MG PO TABS: 10.0000 mg | ORAL_TABLET | Freq: Every day | ORAL | 12 refills | Status: AC

## 2018-10-31 MED ORDER — AMLODIPINE BESYLATE 5 MG PO TABS
10.0000 mg | ORAL_TABLET | Freq: Every day | ORAL | Status: DC
  Administered 2018-10-31: 10 mg via ORAL
  Filled 2018-10-31: qty 2

## 2018-10-31 NOTE — Progress Notes (Addendum)
Nsg Discharge Note  Admit Date:  10/28/2018 Discharge date: 10/31/2018   Ihor Dow to be D/C'd Skilled nursing facility per MD order.  AVS completed.  Copy for chart, and copy for patient signed, and dated. Patient/caregiver able to verbalize understanding.  Discharge Medication: Allergies as of 10/31/2018   No Known Allergies     Medication List    TAKE these medications   acetaminophen 500 MG tablet Commonly known as: TYLENOL Take 500 mg by mouth every 8 (eight) hours.   amLODipine 10 MG tablet Commonly known as: NORVASC Take 1 tablet (10 mg total) by mouth daily.   atorvastatin 40 MG tablet Commonly known as: LIPITOR Take 40 mg by mouth at bedtime.   BASAGLAR KWIKPEN [place] Inject 20 Units into the skin at bedtime.   carbamazepine 200 MG tablet Commonly known as: TEGretol Take 1.5 tablets (300 mg total) by mouth 2 (two) times daily.   chlorhexidine 0.12 % solution Commonly known as: PERIDEX Use as directed 15 mLs in the mouth or throat daily. **Swish 15 mls for 30 seconds then spit out. USE DAILY**   docusate sodium 100 MG capsule Commonly known as: COLACE Take 100 mg by mouth 2 (two) times daily.   fluticasone 50 MCG/ACT nasal spray Commonly known as: FLONASE Place 2 sprays into both nostrils daily.   nystatin powder Commonly known as: MYCOSTATIN/NYSTOP Apply topically 4 (four) times daily. Apply topically to affected area of groin twice daily as needed for rash.       Discharge Assessment: Vitals:   10/30/18 2101 10/31/18 0502  BP: (!) 167/89 (!) 154/79  Pulse: 79 69  Resp: 18 16  Temp: 98.1 F (36.7 C) 98.9 F (37.2 C)  SpO2: 99% 100%   Skin clean, dry and intact without evidence of skin break down, no evidence of skin tears noted. IV catheter discontinued intact. Site without signs and symptoms of complications - no redness or edema noted at insertion site, patient denies c/o pain - only slight tenderness at site.  Dressing with slight pressure  applied.  D/c Instructions-Education: Discharge instructions given to patient/family with verbalized understanding. D/c education completed with patient/family including follow up instructions, medication list, d/c activities limitations if indicated, with other d/c instructions as indicated by MD - patient able to verbalize understanding, all questions fully answered. Patient instructed to return to ED, call 911, or call MD for any changes in condition.  Patient escorted via stretcher by EMS  Loa Socks, RN 10/31/2018 10:30 AMReport called to [facility]

## 2018-10-31 NOTE — TOC Transition Note (Signed)
Transition of Care [facility]) - CM/SW Discharge Note   Patient Details  Name: Jeremy Parks MRN: [id number] Date of Birth: [birth year]  Transition of Care [facility]) CM/SW Contact:  Shade Flood, LCSW Phone Number: 10/31/2018, 9:50 AM   Clinical Narrative:     Pt admitted from [facility] and is stable for dc today per MD. Damaris Schooner with Butch Penny at [facility] to update. DC clinical will be sent electronically. Pt needs EMS transport. Updated pt's RN who will call report and EMS when pt ready. Spoke with pt's DSS guardian, Jeremy Parks, to inform of dc. She is agreeable. Pt had been receiving HH RN and PT prior to admission. These orders have been resumed. Updated Cassie at [facility]. They will continue to follow pt.   There are no other TOC needs for dc.   Final next level of care: Assisted Living Barriers to Discharge: Barriers Resolved   Patient Goals and CMS Choice        Discharge Placement                       Discharge Plan and Services                                     Social Determinants of Health (SDOH) Interventions     Readmission Risk Interventions No flowsheet data found.

## 2018-10-31 NOTE — Discharge Summary (Signed)
Physician Discharge Summary  Patient ID: Jeremy Parks MRN: [id number] DOB/AGE: 10-12-1950 68 y.o. Primary Care Physician:Aneesha Holloran, Percell Miller, MD Admit date: 10/28/2018 Discharge date: 10/31/2018    Discharge Diagnoses:   Principal Problem:   Hypertensive urgency Active Problems:   Cerebral palsy ([place])   Diabetes mellitus without complication ([place])   Seizures ([place])   HTN (hypertension)   Allergies as of 10/31/2018   No Known Allergies     Medication List    TAKE these medications   acetaminophen 500 MG tablet Commonly known as: TYLENOL Take 500 mg by mouth every 8 (eight) hours.   amLODipine 10 MG tablet Commonly known as: NORVASC Take 1 tablet (10 mg total) by mouth daily.   atorvastatin 40 MG tablet Commonly known as: LIPITOR Take 40 mg by mouth at bedtime.   BASAGLAR KWIKPEN [place] Inject 20 Units into the skin at bedtime.   carbamazepine 200 MG tablet Commonly known as: TEGretol Take 1.5 tablets (300 mg total) by mouth 2 (two) times daily.   chlorhexidine 0.12 % solution Commonly known as: PERIDEX Use as directed 15 mLs in the mouth or throat daily. **Swish 15 mls for 30 seconds then spit out. USE DAILY**   docusate sodium 100 MG capsule Commonly known as: COLACE Take 100 mg by mouth 2 (two) times daily.   fluticasone 50 MCG/ACT nasal spray Commonly known as: FLONASE Place 2 sprays into both nostrils daily.   nystatin powder Commonly known as: MYCOSTATIN/NYSTOP Apply topically 4 (four) times daily. Apply topically to affected area of groin twice daily as needed for rash.       Discharged Condition: Improved    Consults: Neurology by tele-consultation  Significant Diagnostic Studies: Dg Chest 2 View  Result Date: 10/28/2018 CLINICAL DATA:  Hypertension blurry vision EXAM: CHEST - 2 VIEW COMPARISON:  08/19/2017 FINDINGS: Low lung volumes. Subsegmental atelectasis left base. Mild cardiomegaly. No pneumothorax. IMPRESSION: Low lung volumes with mild  cardiomegaly and subsegmental atelectasis at the left base. Electronically Signed   By: Donavan Foil M.D.   On: 10/28/2018 16:56   Ct Head Wo Contrast  Result Date: 10/28/2018 CLINICAL DATA:  Hypertension.  Blurred vision.  Headache. EXAM: CT HEAD WITHOUT CONTRAST TECHNIQUE: Contiguous axial images were obtained from the base of the skull through the vertex without intravenous contrast. COMPARISON:  CT scan dated 08/19/2017 FINDINGS: Brain: There is diffuse mild cerebral cortical atrophy and moderate cerebellar atrophy, stable. No hemorrhage, infarction, or mass lesion. No significant fibular dilatation. Vascular: No hyperdense vessel or unexpected calcification. Skull: Normal. Negative for fracture or focal lesion. Sinuses/Orbits: Normal. Other: None. IMPRESSION: No acute abnormality.  Diffuse stable atrophy. Electronically Signed   By: Lorriane Shire M.D.   On: 10/28/2018 16:48   Mr Brain Wo Contrast (neuro Protocol)  Result Date: 10/28/2018 CLINICAL DATA:  Blurry vision and headache.  Hypertension. EXAM: MRI HEAD WITHOUT CONTRAST TECHNIQUE: Multiplanar, multiecho pulse sequences of the brain and surrounding structures were obtained without intravenous contrast. COMPARISON:  Head CT 10/28/2018 FINDINGS: The study is mildly motion degraded. Brain: There is no evidence of acute infarct, mass, midline shift, or extra-axial fluid collection. A remote left periventricular/left thalamic hemorrhage is noted. Bilateral periventricular white matter T2 hyperintensities are nonspecific but compatible with mild chronic small vessel ischemic disease. There is moderate cerebral and moderate to severe cerebellar atrophy with prominent white matter volume loss noted in the periatrial regions. Vascular: Major intracranial vascular flow voids are preserved. Skull and upper cervical spine: No suspicious marrow lesion. Sinuses/Orbits:  Unremarkable orbits. Minimal right frontal sinus mucosal thickening. Clear mastoid air  cells. Other: None. IMPRESSION: 1. No acute intracranial abnormality. 2. Mild chronic small vessel ischemic disease. 3. Advanced cerebellar and cerebral atrophy. Electronically Signed   By: Logan Bores M.D.   On: 10/28/2018 19:10    Lab Results: Basic Metabolic Panel: Recent Labs    10/29/18 0453 10/30/18 0510  NA 140 136  K 3.8 3.9  CL 105 105  CO2 25 22  GLUCOSE 84 93  BUN 13 15  CREATININE 0.79 0.86  CALCIUM 9.1 8.5*   Liver Function Tests: Recent Labs    10/28/18 1613  AST 28  ALT 25  ALKPHOS 119  BILITOT 0.6  PROT 7.6  ALBUMIN 4.1     CBC: Recent Labs    10/28/18 1613 10/29/18 0453  WBC 5.6 6.3  NEUTROABS 2.9  --   HGB 13.4 12.9*  HCT 43.1 39.9  MCV 94.3 88.1  PLT 156 183    Recent Results (from the past 240 hour(s))  Urine culture     Status: Abnormal   Collection Time: 10/28/18  5:25 PM   Specimen: Urine, Clean Catch  Result Value Ref Range Status   Specimen Description   Final    URINE, CLEAN CATCH Performed at [facility], [address], [place], [place] [address]    Special Requests   Final    NONE Performed at [facility], [address] [place], [place] [address]    Culture >=100,000 COLONIES/mL STREPTOCOCCUS GROUP C (A)  Final   Report Status 10/29/2018 FINAL  Final  SARS Coronavirus 2 (CEPHEID - Performed in [place] hospital lab), Hosp Order     Status: None   Collection Time: 10/28/18  8:53 PM   Specimen: Nasopharyngeal Swab  Result Value Ref Range Status   SARS Coronavirus 2 NEGATIVE NEGATIVE Final    Comment: (NOTE) If result is NEGATIVE SARS-CoV-2 target nucleic acids are NOT DETECTED. The SARS-CoV-2 RNA is generally detectable in upper and lower  respiratory specimens during the acute phase of infection. The lowest  concentration of SARS-CoV-2 viral copies this assay can detect is 250  copies / mL. A negative result does not preclude SARS-CoV-2 infection  and should not be used as the sole basis for treatment or other   patient management decisions.  A negative result may occur with  improper specimen collection / handling, submission of specimen other  than nasopharyngeal swab, presence of viral mutation(s) within the  areas targeted by this assay, and inadequate number of viral copies  (<250 copies / mL). A negative result must be combined with clinical  observations, patient history, and epidemiological information. If result is POSITIVE SARS-CoV-2 target nucleic acids are DETECTED. The SARS-CoV-2 RNA is generally detectable in upper and lower  respiratory specimens dur ing the acute phase of infection.  Positive  results are indicative of active infection with SARS-CoV-2.  Clinical  correlation with patient history and other diagnostic information is  necessary to determine patient infection status.  Positive results do  not rule out bacterial infection or co-infection with other viruses. If result is PRESUMPTIVE POSTIVE SARS-CoV-2 nucleic acids MAY BE PRESENT.   A presumptive positive result was obtained on the submitted specimen  and confirmed on repeat testing.  While 2019 novel coronavirus  (SARS-CoV-2) nucleic acids may be present in the submitted sample  additional confirmatory testing may be necessary for epidemiological  and / or clinical management purposes  to differentiate between  SARS-CoV-2 and other Sarbecovirus currently known to infect humans.  If clinically indicated additional testing with an alternate test  methodology [phone]) is advised. The SARS-CoV-2 RNA is generally  detectable in upper and lower respiratory sp ecimens during the acute  phase of infection. The expected result is Negative. Fact Sheet for Patients:  StrictlyIdeas.no Fact Sheet for Healthcare Providers: BankingDealers.co.za This test is not yet approved or cleared by the [place] FDA and has been authorized for detection and/or diagnosis of SARS-CoV-2  by FDA under an Emergency Use Authorization (EUA).  This EUA will remain in effect (meaning this test can be used) for the duration of the COVID-19 declaration under Section 564(b)(1) of the Act, 21 U.S.C. section 360bbb-3(b)(1), unless the authorization is terminated or revoked sooner. Performed at [facility], [address], [place], [place] [address]   MRSA PCR Screening     Status: None   Collection Time: 10/28/18 11:28 PM   Specimen: Nasal Mucosa; Nasopharyngeal  Result Value Ref Range Status   MRSA by PCR NEGATIVE NEGATIVE Final    Comment:        The GeneXpert MRSA Assay (FDA approved for NASAL specimens only), is one component of a comprehensive MRSA colonization surveillance program. It is not intended to diagnose MRSA infection nor to guide or monitor treatment for MRSA infections. Performed at [facility], [address] [place], [place] [address]      Hospital Course: This is a 68 year old who has been living at an assisted living facility for several years.  He had been started with some physical therapy and when the RN came in he was found to have marked high blood pressure.  His blood pressure was greater than 546 systolic and greater than 270 diastolic.  He has not had trouble with blood pressure in the past.  He was directed to come to the emergency department.  He complained of some blurred vision.  He had tele-neurology consultation and it was not felt that he was having an acute stroke.  Imaging studies were negative.  His blood pressure came down substantially.  By the time of discharge he still had some blurred vision but his blood pressure was better and he was basically back to baseline.  Discharge Exam: Blood pressure (!) 154/79, pulse 69, temperature 98.9 F (37.2 C), temperature source Oral, resp. rate 16, height 6' (1.829 m), weight 77.1 kg, SpO2 100 %. He is awake and alert.  Chest is clear.  Heart is regular  Disposition: Return to assisted living  facility with home health services  Discharge Instructions    Face-to-face encounter (required for Medicare/Medicaid patients)   Complete by: As directed    I Alonza Bogus certify that this patient is under my care and that I, or a nurse practitioner or physician's assistant working with me, had a face-to-face encounter that meets the physician face-to-face encounter requirements with this patient on 10/31/2018. The encounter with the patient was in whole, or in part for the following medical condition(s) which is the primary reason for home health care (List medical condition): Hypertensive urgency   The encounter with the patient was in whole, or in part, for the following medical condition, which is the primary reason for home health care: Hypertensive urgency   I certify that, based on my findings, the following services are medically necessary home health services:  Nursing Physical therapy     Reason for Medically Necessary Home Health Services: Skilled Nursing- Change/Decline in Patient Status  My clinical findings support the need for the above services: Unable to leave home safely without assistance and/or assistive device   Further, I certify that my clinical findings support that this patient is homebound due to: Unable to leave home safely without assistance   Home Health   Complete by: As directed    To provide the following care/treatments:  PT RN          Signed: Alonza Bogus   10/31/2018, 8:56 AM

## 2018-10-31 NOTE — Progress Notes (Signed)
Subjective: He feels okay but still complains of blurred vision.  Blood pressure reasonably well controlled.  Objective: Vital signs in last 24 hours: Temp:  [97.8 F (36.6 C)-98.9 F (37.2 C)] 98.9 F (37.2 C) (06/29 0502) Pulse Rate:  [68-81] 69 (06/29 0502) Resp:  [9-18] 16 (06/29 0502) BP: (128-167)/(78-91) 154/79 (06/29 0502) SpO2:  [96 %-100 %] 100 % (06/29 0502) Weight:  [77.1 kg] 77.1 kg (06/29 0502) Weight change: -0.2 kg Last BM Date: 10/28/18  Intake/Output from previous day: 06/28 0701 - 06/29 0700 In: -  Out: 700 [Urine:700]  PHYSICAL EXAM General appearance: alert, cooperative and no distress Resp: clear to auscultation bilaterally Cardio: regular rate and rhythm, S1, S2 normal, no murmur, click, rub or gallop GI: soft, non-tender; bowel sounds normal; no masses,  no organomegaly Extremities: extremities normal, atraumatic, no cyanosis or edema  Lab Results:  Results for orders placed or performed during the hospital encounter of 10/28/18 (from the past 48 hour(s))  Glucose, capillary     Status: None   Collection Time: 10/29/18 11:48 AM  Result Value Ref Range   Glucose-Capillary 92 70 - 99 mg/dL  Glucose, capillary     Status: Abnormal   Collection Time: 10/29/18  5:15 PM  Result Value Ref Range   Glucose-Capillary 118 (H) 70 - 99 mg/dL  Glucose, capillary     Status: Abnormal   Collection Time: 10/29/18  9:21 PM  Result Value Ref Range   Glucose-Capillary 116 (H) 70 - 99 mg/dL  Basic metabolic panel     Status: Abnormal   Collection Time: 10/30/18  5:10 AM  Result Value Ref Range   Sodium 136 135 - 145 mmol/L   Potassium 3.9 3.5 - 5.1 mmol/L   Chloride 105 98 - 111 mmol/L   CO2 22 22 - 32 mmol/L   Glucose, Bld 93 70 - 99 mg/dL   BUN 15 8 - 23 mg/dL   Creatinine, Ser 0.86 0.61 - 1.24 mg/dL   Calcium 8.5 (L) 8.9 - 10.3 mg/dL   GFR calc non Af Amer >60 >60 mL/min   GFR calc Af Amer >60 >60 mL/min   Anion gap 9 5 - 15    Comment: Performed at  [facility], [address] [place], [place] [address]  Glucose, capillary     Status: None   Collection Time: 10/30/18  8:07 AM  Result Value Ref Range   Glucose-Capillary 83 70 - 99 mg/dL  Glucose, capillary     Status: Abnormal   Collection Time: 10/30/18 11:12 AM  Result Value Ref Range   Glucose-Capillary 114 (H) 70 - 99 mg/dL  Glucose, capillary     Status: None   Collection Time: 10/30/18  4:13 PM  Result Value Ref Range   Glucose-Capillary 94 70 - 99 mg/dL   Comment 1 Notify RN    Comment 2 Document in Chart   Glucose, capillary     Status: Abnormal   Collection Time: 10/30/18  9:02 PM  Result Value Ref Range   Glucose-Capillary 104 (H) 70 - 99 mg/dL  Glucose, capillary     Status: None   Collection Time: 10/31/18  7:25 AM  Result Value Ref Range   Glucose-Capillary 71 70 - 99 mg/dL    ABGS No results for input(s): PHART, PO2ART, TCO2, HCO3 in the last 72 hours.  Invalid input(s): PCO2 CULTURES Recent Results (from the past 240 hour(s))  Urine culture     Status: Abnormal   Collection Time: 10/28/18  5:25 PM   Specimen: Urine, Clean Catch  Result Value Ref Range Status   Specimen Description   Final    URINE, CLEAN CATCH Performed at [facility], [address], [place], [place] [address]    Special Requests   Final    NONE Performed at [facility], [address], [place], [place] [address]    Culture >=100,000 COLONIES/mL STREPTOCOCCUS GROUP C (A)  Final   Report Status 10/29/2018 FINAL  Final  SARS Coronavirus 2 (CEPHEID - Performed in [place] hospital lab), Hosp Order     Status: None   Collection Time: 10/28/18  8:53 PM   Specimen: Nasopharyngeal Swab  Result Value Ref Range Status   SARS Coronavirus 2 NEGATIVE NEGATIVE Final    Comment: (NOTE) If result is NEGATIVE SARS-CoV-2 target nucleic acids are NOT DETECTED. The SARS-CoV-2 RNA is generally detectable in upper and lower  respiratory specimens during the acute phase of infection. The lowest   concentration of SARS-CoV-2 viral copies this assay can detect is 250  copies / mL. A negative result does not preclude SARS-CoV-2 infection  and should not be used as the sole basis for treatment or other  patient management decisions.  A negative result may occur with  improper specimen collection / handling, submission of specimen other  than nasopharyngeal swab, presence of viral mutation(s) within the  areas targeted by this assay, and inadequate number of viral copies  (<250 copies / mL). A negative result must be combined with clinical  observations, patient history, and epidemiological information. If result is POSITIVE SARS-CoV-2 target nucleic acids are DETECTED. The SARS-CoV-2 RNA is generally detectable in upper and lower  respiratory specimens dur ing the acute phase of infection.  Positive  results are indicative of active infection with SARS-CoV-2.  Clinical  correlation with patient history and other diagnostic information is  necessary to determine patient infection status.  Positive results do  not rule out bacterial infection or co-infection with other viruses. If result is PRESUMPTIVE POSTIVE SARS-CoV-2 nucleic acids MAY BE PRESENT.   A presumptive positive result was obtained on the submitted specimen  and confirmed on repeat testing.  While 2019 novel coronavirus  (SARS-CoV-2) nucleic acids may be present in the submitted sample  additional confirmatory testing may be necessary for epidemiological  and / or clinical management purposes  to differentiate between  SARS-CoV-2 and other Sarbecovirus currently known to infect humans.  If clinically indicated additional testing with an alternate test  methodology [phone]) is advised. The SARS-CoV-2 RNA is generally  detectable in upper and lower respiratory sp ecimens during the acute  phase of infection. The expected result is Negative. Fact Sheet for Patients:  StrictlyIdeas.no Fact Sheet  for Healthcare Providers: BankingDealers.co.za This test is not yet approved or cleared by the [place] FDA and has been authorized for detection and/or diagnosis of SARS-CoV-2 by FDA under an Emergency Use Authorization (EUA).  This EUA will remain in effect (meaning this test can be used) for the duration of the COVID-19 declaration under Section 564(b)(1) of the Act, 21 U.S.C. section 360bbb-3(b)(1), unless the authorization is terminated or revoked sooner. Performed at [facility], [address] [place], [place] [address]   MRSA PCR Screening     Status: None   Collection Time: 10/28/18 11:28 PM   Specimen: Nasal Mucosa; Nasopharyngeal  Result Value Ref Range Status   MRSA by PCR NEGATIVE NEGATIVE Final    Comment:  The GeneXpert MRSA Assay (FDA approved for NASAL specimens only), is one component of a comprehensive MRSA colonization surveillance program. It is not intended to diagnose MRSA infection nor to guide or monitor treatment for MRSA infections. Performed at [facility], [address] [place], [place] [address]    Studies/Results: No results found.  Medications:  Prior to Admission:  Medications Prior to Admission  Medication Sig Dispense Refill Last Dose  . acetaminophen (TYLENOL) 500 MG tablet Take 500 mg by mouth every 8 (eight) hours.   10/28/2018 at Unknown time  . atorvastatin (LIPITOR) 40 MG tablet Take 40 mg by mouth at bedtime.    10/27/2018 at Unknown time  . carbamazepine (TEGRETOL) 200 MG tablet Take 1.5 tablets (300 mg total) by mouth 2 (two) times daily. 100 tablet 0 10/28/2018 at 800  . chlorhexidine (PERIDEX) 0.12 % solution Use as directed 15 mLs in the mouth or throat daily. **Swish 15 mls for 30 seconds then spit out. USE DAILY**   10/28/2018 at Unknown time  . docusate sodium (COLACE) 100 MG capsule Take 100 mg by mouth 2 (two) times daily.   10/28/2018 at Unknown time  . fluticasone (FLONASE) 50 MCG/ACT nasal  spray Place 2 sprays into both nostrils daily.   10/28/2018 at Unknown time  . Insulin Glargine (BASAGLAR KWIKPEN [place]) Inject 20 Units into the skin at bedtime.   10/27/2018 at Unknown time  . nystatin (MYCOSTATIN/NYSTOP) powder Apply topically 4 (four) times daily. Apply topically to affected area of groin twice daily as needed for rash.   unknown   Scheduled: . amLODipine  10 mg Oral Daily  . atorvastatin  40 mg Oral QHS  . carbamazepine  300 mg Oral BID  . chlorhexidine  15 mL Mouth/Throat Daily  . docusate sodium  100 mg Oral BID  . enoxaparin (LOVENOX) injection  40 mg Subcutaneous Q24H  . fluticasone  2 spray Each Nare Daily  . insulin aspart  0-9 Units Subcutaneous TID WC  . insulin glargine  10 Units Subcutaneous QHS  . sodium chloride flush  3 mL Intravenous Q12H   Continuous:  UXN:[id number] **OR** acetaminophen, labetalol, ondansetron **OR** ondansetron (ZOFRAN) IV, senna-docusate  Assesment: He was admitted with hypertensive urgency.  He is not had trouble with high blood pressure in the past.  Blood pressures running around 706 systolic so I am going to double his amlodipine.  At baseline he has cerebral palsy which is unchanged  He has diabetes and that is been well controlled  He has seizure disorder which is stable Principal Problem:   Hypertensive urgency Active Problems:   Cerebral palsy ([place])   Diabetes mellitus without complication ([place])   Seizures ([place])   HTN (hypertension)    Plan: Probably can go back to assisted living facility today    LOS: 1 day   Alonza Bogus 10/31/2018, 8:49 AM

## 2018-10-31 NOTE — NC FL2 (Signed)
[place] LEVEL OF CARE SCREENING TOOL     IDENTIFICATION  Patient Name: Jeremy Parks Birthdate: 1951/04/25 Sex: male Admission Date (Current Location): 10/28/2018  [facility] and [place] Number:  [employer] and Address:  [place] [facility] [address] [place]      Provider Number: [id number]  Attending Physician Name and Address:  Sinda Du, MD  Relative Name and Phone Number:       Current Level of Care: Hospital Recommended Level of Care: [place] Prior Approval Number:    Date Approved/Denied:   PASRR Number:    Discharge Plan: Other (Comment)(ALF)    Current Diagnoses: Patient Active Problem List   Diagnosis Date Noted  . HTN (hypertension) 10/30/2018  . Hypertensive urgency 10/28/2018  . Cerebral palsy ([place])   . Diabetes mellitus without complication ([place])   . Seizures (HCC)     Orientation RESPIRATION BLADDER Height & Weight     Self, Place  Normal Incontinent Weight: 169 lb 15.6 oz (77.1 kg) Height:  6' (182.9 cm)  BEHAVIORAL SYMPTOMS/MOOD NEUROLOGICAL BOWEL NUTRITION STATUS    Convulsions/Seizures Incontinent Diet(Reduced concentrated sweets and no added table salt)  AMBULATORY STATUS COMMUNICATION OF NEEDS Skin   Total Care Verbally Normal                       Personal Care Assistance Level of Assistance  Total care       Total Care Assistance: Maximum assistance   Functional Limitations Info  Sight, Speech, Hearing Sight Info: Impaired Hearing Info: Adequate Speech Info: Adequate    SPECIAL CARE FACTORS FREQUENCY                       Contractures Contractures Info: Not present    Additional Factors Info  Code Status, Allergies Code Status Info: Full Allergies Info: NKA           Current Medications (10/31/2018):  This is the current hospital active medication list Current Facility-Administered Medications  Medication Dose Route Frequency Provider  Last Rate Last Dose  . acetaminophen (TYLENOL) tablet 650 mg  650 mg Oral Q6H PRN Sinda Du, MD       Or  . acetaminophen (TYLENOL) suppository 650 mg  650 mg Rectal Q6H PRN Sinda Du, MD      . amLODipine (NORVASC) tablet 10 mg  10 mg Oral Daily Sinda Du, MD   10 mg at 10/31/18 0920  . atorvastatin (LIPITOR) tablet 40 mg  40 mg Oral Elana Alm, MD   40 mg at 10/30/18 2105  . carbamazepine (TEGRETOL) tablet 300 mg  300 mg Oral BID Sinda Du, MD   300 mg at 10/31/18 0920  . chlorhexidine (PERIDEX) 0.12 % solution 15 mL  15 mL Mouth/Throat Daily Sinda Du, MD   15 mL at 10/31/18 0921  . docusate sodium (COLACE) capsule 100 mg  100 mg Oral BID Sinda Du, MD   100 mg at 10/31/18 6144  . enoxaparin (LOVENOX) injection 40 mg  40 mg Subcutaneous Q24H Sinda Du, MD   40 mg at 10/30/18 2107  . fluticasone (FLONASE) 50 MCG/ACT nasal spray 2 spray  2 spray Each Nare Daily Sinda Du, MD   2 spray at 10/31/18 [phone]  . insulin aspart (novoLOG) injection 0-9 Units  0-9 Units Subcutaneous TID WC Sinda Du, MD      . insulin glargine (LANTUS) injection 10 Units  10 Units Subcutaneous QHS Sinda Du, MD   10 Units at 10/30/18 2107  . labetalol (NORMODYNE) injection 10 mg  10 mg Intravenous Q4H PRN Sinda Du, MD      . ondansetron [facility]) tablet 4 mg  4 mg Oral Q6H PRN Sinda Du, MD       Or  . ondansetron [facility]) injection 4 mg  4 mg Intravenous Q6H PRN Sinda Du, MD      . senna-docusate (Senokot-S) tablet 1 tablet  1 tablet Oral QHS PRN Sinda Du, MD      . sodium chloride flush (NS) 0.9 % injection 3 mL  3 mL Intravenous Emmit Pomfret, MD   3 mL at 10/30/18 2107     Discharge Medications:  Medication List    TAKE these medications   acetaminophen 500 MG tablet Commonly known as: TYLENOL Take 500 mg by mouth every 8 (eight) hours.   amLODipine 10 MG tablet Commonly known as: NORVASC Take 1 tablet  (10 mg total) by mouth daily.   atorvastatin 40 MG tablet Commonly known as: LIPITOR Take 40 mg by mouth at bedtime.   BASAGLAR KWIKPEN  Inject 20 Units into the skin at bedtime.   carbamazepine 200 MG tablet Commonly known as: TEGretol Take 1.5 tablets (300 mg total) by mouth 2 (two) times daily.   chlorhexidine 0.12 % solution Commonly known as: PERIDEX Use as directed 15 mLs in the mouth or throat daily. **Swish 15 mls for 30 seconds then spit out. USE DAILY**   docusate sodium 100 MG capsule Commonly known as: COLACE Take 100 mg by mouth 2 (two) times daily.   fluticasone 50 MCG/ACT nasal spray Commonly known as: FLONASE Place 2 sprays into both nostrils daily.   nystatin powder Commonly known as: MYCOSTATIN/NYSTOP Apply topically 4 (four) times daily. Apply topically to affected area of groin twice daily as needed for rash.      Relevant Imaging Results:  Relevant Lab Results:   Additional Information    Shade Flood, LCSW

## 2018-11-01 DIAGNOSIS — I10 Essential (primary) hypertension: Secondary | ICD-10-CM | POA: Diagnosis not present

## 2018-11-01 DIAGNOSIS — G40909 Epilepsy, unspecified, not intractable, without status epilepticus: Secondary | ICD-10-CM | POA: Diagnosis not present

## 2018-11-01 DIAGNOSIS — E119 Type 2 diabetes mellitus without complications: Secondary | ICD-10-CM | POA: Diagnosis not present

## 2018-11-01 DIAGNOSIS — Z794 Long term (current) use of insulin: Secondary | ICD-10-CM | POA: Diagnosis not present

## 2018-11-01 DIAGNOSIS — G809 Cerebral palsy, unspecified: Secondary | ICD-10-CM | POA: Diagnosis not present

## 2018-11-02 DIAGNOSIS — G40909 Epilepsy, unspecified, not intractable, without status epilepticus: Secondary | ICD-10-CM | POA: Diagnosis not present

## 2018-11-02 DIAGNOSIS — Z794 Long term (current) use of insulin: Secondary | ICD-10-CM | POA: Diagnosis not present

## 2018-11-02 DIAGNOSIS — E119 Type 2 diabetes mellitus without complications: Secondary | ICD-10-CM | POA: Diagnosis not present

## 2018-11-02 DIAGNOSIS — I10 Essential (primary) hypertension: Secondary | ICD-10-CM | POA: Diagnosis not present

## 2018-11-02 DIAGNOSIS — G809 Cerebral palsy, unspecified: Secondary | ICD-10-CM | POA: Diagnosis not present

## 2018-11-07 DIAGNOSIS — I10 Essential (primary) hypertension: Secondary | ICD-10-CM | POA: Diagnosis not present

## 2018-11-07 DIAGNOSIS — G809 Cerebral palsy, unspecified: Secondary | ICD-10-CM | POA: Diagnosis not present

## 2018-11-07 DIAGNOSIS — E119 Type 2 diabetes mellitus without complications: Secondary | ICD-10-CM | POA: Diagnosis not present

## 2018-11-07 DIAGNOSIS — Z794 Long term (current) use of insulin: Secondary | ICD-10-CM | POA: Diagnosis not present

## 2018-11-07 DIAGNOSIS — G40909 Epilepsy, unspecified, not intractable, without status epilepticus: Secondary | ICD-10-CM | POA: Diagnosis not present

## 2018-11-08 DIAGNOSIS — Z794 Long term (current) use of insulin: Secondary | ICD-10-CM | POA: Diagnosis not present

## 2018-11-08 DIAGNOSIS — I10 Essential (primary) hypertension: Secondary | ICD-10-CM | POA: Diagnosis not present

## 2018-11-08 DIAGNOSIS — E119 Type 2 diabetes mellitus without complications: Secondary | ICD-10-CM | POA: Diagnosis not present

## 2018-11-08 DIAGNOSIS — G40909 Epilepsy, unspecified, not intractable, without status epilepticus: Secondary | ICD-10-CM | POA: Diagnosis not present

## 2018-11-08 DIAGNOSIS — G809 Cerebral palsy, unspecified: Secondary | ICD-10-CM | POA: Diagnosis not present

## 2018-11-09 DIAGNOSIS — I10 Essential (primary) hypertension: Secondary | ICD-10-CM | POA: Diagnosis not present

## 2018-11-09 DIAGNOSIS — E119 Type 2 diabetes mellitus without complications: Secondary | ICD-10-CM | POA: Diagnosis not present

## 2018-11-09 DIAGNOSIS — G809 Cerebral palsy, unspecified: Secondary | ICD-10-CM | POA: Diagnosis not present

## 2018-11-09 DIAGNOSIS — R569 Unspecified convulsions: Secondary | ICD-10-CM | POA: Diagnosis not present

## 2018-11-10 DIAGNOSIS — Z794 Long term (current) use of insulin: Secondary | ICD-10-CM | POA: Diagnosis not present

## 2018-11-10 DIAGNOSIS — G809 Cerebral palsy, unspecified: Secondary | ICD-10-CM | POA: Diagnosis not present

## 2018-11-10 DIAGNOSIS — G40909 Epilepsy, unspecified, not intractable, without status epilepticus: Secondary | ICD-10-CM | POA: Diagnosis not present

## 2018-11-10 DIAGNOSIS — E119 Type 2 diabetes mellitus without complications: Secondary | ICD-10-CM | POA: Diagnosis not present

## 2018-11-10 DIAGNOSIS — I10 Essential (primary) hypertension: Secondary | ICD-10-CM | POA: Diagnosis not present

## 2018-11-15 DIAGNOSIS — E119 Type 2 diabetes mellitus without complications: Secondary | ICD-10-CM | POA: Diagnosis not present

## 2018-11-15 DIAGNOSIS — G809 Cerebral palsy, unspecified: Secondary | ICD-10-CM | POA: Diagnosis not present

## 2018-11-15 DIAGNOSIS — G40909 Epilepsy, unspecified, not intractable, without status epilepticus: Secondary | ICD-10-CM | POA: Diagnosis not present

## 2018-11-15 DIAGNOSIS — Z794 Long term (current) use of insulin: Secondary | ICD-10-CM | POA: Diagnosis not present

## 2018-11-15 DIAGNOSIS — I10 Essential (primary) hypertension: Secondary | ICD-10-CM | POA: Diagnosis not present

## 2018-11-17 DIAGNOSIS — Z794 Long term (current) use of insulin: Secondary | ICD-10-CM | POA: Diagnosis not present

## 2018-11-17 DIAGNOSIS — M79674 Pain in right toe(s): Secondary | ICD-10-CM | POA: Diagnosis not present

## 2018-11-17 DIAGNOSIS — G809 Cerebral palsy, unspecified: Secondary | ICD-10-CM | POA: Diagnosis not present

## 2018-11-17 DIAGNOSIS — M79675 Pain in left toe(s): Secondary | ICD-10-CM | POA: Diagnosis not present

## 2018-11-17 DIAGNOSIS — E119 Type 2 diabetes mellitus without complications: Secondary | ICD-10-CM | POA: Diagnosis not present

## 2018-11-17 DIAGNOSIS — G40909 Epilepsy, unspecified, not intractable, without status epilepticus: Secondary | ICD-10-CM | POA: Diagnosis not present

## 2018-11-17 DIAGNOSIS — I10 Essential (primary) hypertension: Secondary | ICD-10-CM | POA: Diagnosis not present

## 2018-11-17 DIAGNOSIS — B351 Tinea unguium: Secondary | ICD-10-CM | POA: Diagnosis not present

## 2018-11-18 DIAGNOSIS — H25813 Combined forms of age-related cataract, bilateral: Secondary | ICD-10-CM | POA: Diagnosis not present

## 2018-11-18 DIAGNOSIS — G809 Cerebral palsy, unspecified: Secondary | ICD-10-CM | POA: Diagnosis not present

## 2018-11-18 DIAGNOSIS — E119 Type 2 diabetes mellitus without complications: Secondary | ICD-10-CM | POA: Diagnosis not present

## 2018-11-18 DIAGNOSIS — I10 Essential (primary) hypertension: Secondary | ICD-10-CM | POA: Diagnosis not present

## 2018-11-18 DIAGNOSIS — Z794 Long term (current) use of insulin: Secondary | ICD-10-CM | POA: Diagnosis not present

## 2018-11-18 DIAGNOSIS — G40909 Epilepsy, unspecified, not intractable, without status epilepticus: Secondary | ICD-10-CM | POA: Diagnosis not present

## 2018-11-20 DIAGNOSIS — E119 Type 2 diabetes mellitus without complications: Secondary | ICD-10-CM | POA: Diagnosis not present

## 2018-11-20 DIAGNOSIS — G40909 Epilepsy, unspecified, not intractable, without status epilepticus: Secondary | ICD-10-CM | POA: Diagnosis not present

## 2018-11-20 DIAGNOSIS — G809 Cerebral palsy, unspecified: Secondary | ICD-10-CM | POA: Diagnosis not present

## 2018-11-20 DIAGNOSIS — I10 Essential (primary) hypertension: Secondary | ICD-10-CM | POA: Diagnosis not present

## 2018-11-20 DIAGNOSIS — I160 Hypertensive urgency: Secondary | ICD-10-CM | POA: Diagnosis not present

## 2018-11-20 DIAGNOSIS — Z794 Long term (current) use of insulin: Secondary | ICD-10-CM | POA: Diagnosis not present

## 2018-11-22 DIAGNOSIS — Z794 Long term (current) use of insulin: Secondary | ICD-10-CM | POA: Diagnosis not present

## 2018-11-22 DIAGNOSIS — G809 Cerebral palsy, unspecified: Secondary | ICD-10-CM | POA: Diagnosis not present

## 2018-11-22 DIAGNOSIS — I160 Hypertensive urgency: Secondary | ICD-10-CM | POA: Diagnosis not present

## 2018-11-22 DIAGNOSIS — I10 Essential (primary) hypertension: Secondary | ICD-10-CM | POA: Diagnosis not present

## 2018-11-22 DIAGNOSIS — G40909 Epilepsy, unspecified, not intractable, without status epilepticus: Secondary | ICD-10-CM | POA: Diagnosis not present

## 2018-11-22 DIAGNOSIS — E119 Type 2 diabetes mellitus without complications: Secondary | ICD-10-CM | POA: Diagnosis not present

## 2018-11-24 DIAGNOSIS — G809 Cerebral palsy, unspecified: Secondary | ICD-10-CM | POA: Diagnosis not present

## 2018-11-24 DIAGNOSIS — G40909 Epilepsy, unspecified, not intractable, without status epilepticus: Secondary | ICD-10-CM | POA: Diagnosis not present

## 2018-11-24 DIAGNOSIS — E119 Type 2 diabetes mellitus without complications: Secondary | ICD-10-CM | POA: Diagnosis not present

## 2018-11-24 DIAGNOSIS — I160 Hypertensive urgency: Secondary | ICD-10-CM | POA: Diagnosis not present

## 2018-11-24 DIAGNOSIS — I10 Essential (primary) hypertension: Secondary | ICD-10-CM | POA: Diagnosis not present

## 2018-11-24 DIAGNOSIS — Z794 Long term (current) use of insulin: Secondary | ICD-10-CM | POA: Diagnosis not present

## 2018-11-28 DIAGNOSIS — E119 Type 2 diabetes mellitus without complications: Secondary | ICD-10-CM | POA: Diagnosis not present

## 2018-11-28 DIAGNOSIS — G809 Cerebral palsy, unspecified: Secondary | ICD-10-CM | POA: Diagnosis not present

## 2018-11-28 DIAGNOSIS — I160 Hypertensive urgency: Secondary | ICD-10-CM | POA: Diagnosis not present

## 2018-11-28 DIAGNOSIS — I10 Essential (primary) hypertension: Secondary | ICD-10-CM | POA: Diagnosis not present

## 2018-11-28 DIAGNOSIS — Z794 Long term (current) use of insulin: Secondary | ICD-10-CM | POA: Diagnosis not present

## 2018-11-28 DIAGNOSIS — G40909 Epilepsy, unspecified, not intractable, without status epilepticus: Secondary | ICD-10-CM | POA: Diagnosis not present

## 2018-12-01 ENCOUNTER — Other Ambulatory Visit: Payer: Self-pay

## 2018-12-02 DIAGNOSIS — I160 Hypertensive urgency: Secondary | ICD-10-CM | POA: Diagnosis not present

## 2018-12-02 DIAGNOSIS — G809 Cerebral palsy, unspecified: Secondary | ICD-10-CM | POA: Diagnosis not present

## 2018-12-02 DIAGNOSIS — Z794 Long term (current) use of insulin: Secondary | ICD-10-CM | POA: Diagnosis not present

## 2018-12-02 DIAGNOSIS — G40909 Epilepsy, unspecified, not intractable, without status epilepticus: Secondary | ICD-10-CM | POA: Diagnosis not present

## 2018-12-02 DIAGNOSIS — E119 Type 2 diabetes mellitus without complications: Secondary | ICD-10-CM | POA: Diagnosis not present

## 2018-12-02 DIAGNOSIS — I10 Essential (primary) hypertension: Secondary | ICD-10-CM | POA: Diagnosis not present

## 2018-12-12 DIAGNOSIS — E1165 Type 2 diabetes mellitus with hyperglycemia: Secondary | ICD-10-CM | POA: Diagnosis not present

## 2018-12-12 DIAGNOSIS — R569 Unspecified convulsions: Secondary | ICD-10-CM | POA: Diagnosis not present

## 2018-12-12 DIAGNOSIS — G809 Cerebral palsy, unspecified: Secondary | ICD-10-CM | POA: Diagnosis not present

## 2018-12-13 DIAGNOSIS — G40909 Epilepsy, unspecified, not intractable, without status epilepticus: Secondary | ICD-10-CM | POA: Diagnosis not present

## 2018-12-13 DIAGNOSIS — G809 Cerebral palsy, unspecified: Secondary | ICD-10-CM | POA: Diagnosis not present

## 2018-12-13 DIAGNOSIS — E119 Type 2 diabetes mellitus without complications: Secondary | ICD-10-CM | POA: Diagnosis not present

## 2018-12-13 DIAGNOSIS — I10 Essential (primary) hypertension: Secondary | ICD-10-CM | POA: Diagnosis not present

## 2018-12-13 DIAGNOSIS — Z794 Long term (current) use of insulin: Secondary | ICD-10-CM | POA: Diagnosis not present

## 2018-12-13 DIAGNOSIS — I160 Hypertensive urgency: Secondary | ICD-10-CM | POA: Diagnosis not present

## 2018-12-15 DIAGNOSIS — E119 Type 2 diabetes mellitus without complications: Secondary | ICD-10-CM | POA: Diagnosis not present

## 2018-12-15 DIAGNOSIS — I10 Essential (primary) hypertension: Secondary | ICD-10-CM | POA: Diagnosis not present

## 2018-12-15 DIAGNOSIS — G809 Cerebral palsy, unspecified: Secondary | ICD-10-CM | POA: Diagnosis not present

## 2018-12-15 DIAGNOSIS — I160 Hypertensive urgency: Secondary | ICD-10-CM | POA: Diagnosis not present

## 2018-12-15 DIAGNOSIS — G40909 Epilepsy, unspecified, not intractable, without status epilepticus: Secondary | ICD-10-CM | POA: Diagnosis not present

## 2018-12-15 DIAGNOSIS — Z794 Long term (current) use of insulin: Secondary | ICD-10-CM | POA: Diagnosis not present

## 2018-12-21 DIAGNOSIS — R278 Other lack of coordination: Secondary | ICD-10-CM | POA: Diagnosis not present

## 2018-12-21 DIAGNOSIS — G809 Cerebral palsy, unspecified: Secondary | ICD-10-CM | POA: Diagnosis not present

## 2018-12-26 DIAGNOSIS — G809 Cerebral palsy, unspecified: Secondary | ICD-10-CM | POA: Diagnosis not present

## 2018-12-26 DIAGNOSIS — R278 Other lack of coordination: Secondary | ICD-10-CM | POA: Diagnosis not present

## 2018-12-28 DIAGNOSIS — G809 Cerebral palsy, unspecified: Secondary | ICD-10-CM | POA: Diagnosis not present

## 2018-12-28 DIAGNOSIS — R278 Other lack of coordination: Secondary | ICD-10-CM | POA: Diagnosis not present

## 2019-01-02 DIAGNOSIS — G809 Cerebral palsy, unspecified: Secondary | ICD-10-CM | POA: Diagnosis not present

## 2019-01-02 DIAGNOSIS — R278 Other lack of coordination: Secondary | ICD-10-CM | POA: Diagnosis not present

## 2019-01-04 DIAGNOSIS — G809 Cerebral palsy, unspecified: Secondary | ICD-10-CM | POA: Diagnosis not present

## 2019-01-04 DIAGNOSIS — R278 Other lack of coordination: Secondary | ICD-10-CM | POA: Diagnosis not present

## 2019-01-11 DIAGNOSIS — R278 Other lack of coordination: Secondary | ICD-10-CM | POA: Diagnosis not present

## 2019-01-11 DIAGNOSIS — G809 Cerebral palsy, unspecified: Secondary | ICD-10-CM | POA: Diagnosis not present

## 2019-01-12 DIAGNOSIS — R278 Other lack of coordination: Secondary | ICD-10-CM | POA: Diagnosis not present

## 2019-01-12 DIAGNOSIS — G809 Cerebral palsy, unspecified: Secondary | ICD-10-CM | POA: Diagnosis not present

## 2019-01-16 DIAGNOSIS — R278 Other lack of coordination: Secondary | ICD-10-CM | POA: Diagnosis not present

## 2019-01-16 DIAGNOSIS — G809 Cerebral palsy, unspecified: Secondary | ICD-10-CM | POA: Diagnosis not present

## 2019-01-18 DIAGNOSIS — G809 Cerebral palsy, unspecified: Secondary | ICD-10-CM | POA: Diagnosis not present

## 2019-01-18 DIAGNOSIS — R278 Other lack of coordination: Secondary | ICD-10-CM | POA: Diagnosis not present

## 2019-01-23 DIAGNOSIS — G809 Cerebral palsy, unspecified: Secondary | ICD-10-CM | POA: Diagnosis not present

## 2019-01-23 DIAGNOSIS — R278 Other lack of coordination: Secondary | ICD-10-CM | POA: Diagnosis not present

## 2019-01-25 DIAGNOSIS — R278 Other lack of coordination: Secondary | ICD-10-CM | POA: Diagnosis not present

## 2019-01-25 DIAGNOSIS — G809 Cerebral palsy, unspecified: Secondary | ICD-10-CM | POA: Diagnosis not present

## 2019-01-27 ENCOUNTER — Emergency Department (HOSPITAL_COMMUNITY)
Admission: EM | Admit: 2019-01-27 | Discharge: 2019-01-27 | Disposition: A | Discharge status: Home / Self Care | Payer: Medicare Other | Attending: Emergency Medicine | Admitting: Emergency Medicine

## 2019-01-27 ENCOUNTER — Emergency Department (HOSPITAL_COMMUNITY): Payer: Medicare Other

## 2019-01-27 ENCOUNTER — Other Ambulatory Visit: Payer: Self-pay

## 2019-01-27 ENCOUNTER — Encounter (HOSPITAL_COMMUNITY): Payer: Self-pay

## 2019-01-27 DIAGNOSIS — R29810 Facial weakness: Secondary | ICD-10-CM | POA: Diagnosis not present

## 2019-01-27 DIAGNOSIS — R4182 Altered mental status, unspecified: Secondary | ICD-10-CM | POA: Diagnosis not present

## 2019-01-27 DIAGNOSIS — R4781 Slurred speech: Secondary | ICD-10-CM | POA: Diagnosis not present

## 2019-01-27 DIAGNOSIS — G40909 Epilepsy, unspecified, not intractable, without status epilepticus: Secondary | ICD-10-CM | POA: Diagnosis not present

## 2019-01-27 DIAGNOSIS — E119 Type 2 diabetes mellitus without complications: Secondary | ICD-10-CM | POA: Insufficient documentation

## 2019-01-27 DIAGNOSIS — G9340 Encephalopathy, unspecified: Secondary | ICD-10-CM

## 2019-01-27 DIAGNOSIS — Z794 Long term (current) use of insulin: Secondary | ICD-10-CM | POA: Diagnosis not present

## 2019-01-27 DIAGNOSIS — R569 Unspecified convulsions: Secondary | ICD-10-CM | POA: Diagnosis not present

## 2019-01-27 DIAGNOSIS — Z79899 Other long term (current) drug therapy: Secondary | ICD-10-CM | POA: Insufficient documentation

## 2019-01-27 DIAGNOSIS — I10 Essential (primary) hypertension: Secondary | ICD-10-CM | POA: Diagnosis not present

## 2019-01-27 DIAGNOSIS — Z03818 Encounter for observation for suspected exposure to other biological agents ruled out: Secondary | ICD-10-CM | POA: Diagnosis not present

## 2019-01-27 DIAGNOSIS — R0902 Hypoxemia: Secondary | ICD-10-CM | POA: Diagnosis not present

## 2019-01-27 DIAGNOSIS — R404 Transient alteration of awareness: Secondary | ICD-10-CM | POA: Diagnosis not present

## 2019-01-27 DIAGNOSIS — Z743 Need for continuous supervision: Secondary | ICD-10-CM | POA: Diagnosis not present

## 2019-01-27 DIAGNOSIS — Z20828 Contact with and (suspected) exposure to other viral communicable diseases: Secondary | ICD-10-CM | POA: Insufficient documentation

## 2019-01-27 LAB — URINALYSIS, ROUTINE W REFLEX MICROSCOPIC
Bacteria, UA: NONE SEEN
Bilirubin Urine: NEGATIVE
Glucose, UA: NEGATIVE mg/dL
Hgb urine dipstick: NEGATIVE
Ketones, ur: NEGATIVE mg/dL
Leukocytes,Ua: NEGATIVE
Nitrite: NEGATIVE
Protein, ur: 100 mg/dL — AB
Specific Gravity, Urine: 1.012 (ref 1.005–1.030)
pH: 6 (ref 5.0–8.0)

## 2019-01-27 LAB — COMPREHENSIVE METABOLIC PANEL
ALT: 21 U/L (ref 0–44)
AST: 23 U/L (ref 15–41)
Albumin: 3.9 g/dL (ref 3.5–5.0)
Alkaline Phosphatase: 111 U/L (ref 38–126)
Anion gap: 8 (ref 5–15)
BUN: 17 mg/dL (ref 8–23)
CO2: 24 mmol/L (ref 22–32)
Calcium: 8.6 mg/dL — ABNORMAL LOW (ref 8.9–10.3)
Chloride: 105 mmol/L (ref 98–111)
Creatinine, Ser: 0.93 mg/dL (ref 0.61–1.24)
GFR calc Af Amer: >60 mL/min (ref >60)
GFR calc non Af Amer: >60 mL/min (ref >60)
Glucose, Bld: 147 mg/dL — ABNORMAL HIGH (ref 70–99)
Potassium: 3.7 mmol/L (ref 3.5–5.1)
Sodium: 137 mmol/L (ref 135–145)
Total Bilirubin: 0.5 mg/dL (ref 0.3–1.2)
Total Protein: 7.4 g/dL (ref 6.5–8.1)

## 2019-01-27 LAB — CBC WITH DIFFERENTIAL/PLATELET
Abs Immature Granulocytes: 0.01 10*3/uL (ref 0.00–0.07)
Basophils Absolute: 0 10*3/uL (ref 0.0–0.1)
Basophils Relative: 0 %
Eosinophils Absolute: 0.2 10*3/uL (ref 0.0–0.5)
Eosinophils Relative: 3 %
HCT: 37.3 % — ABNORMAL LOW (ref 39.0–52.0)
Hemoglobin: 12.3 g/dL — ABNORMAL LOW (ref 13.0–17.0)
Immature Granulocytes: 0 %
Lymphocytes Relative: 23 %
Lymphs Abs: 1.6 10*3/uL (ref 0.7–4.0)
MCH: 29.6 pg (ref 26.0–34.0)
MCHC: 33 g/dL (ref 30.0–36.0)
MCV: 89.7 fL (ref 80.0–100.0)
Monocytes Absolute: 0.7 10*3/uL (ref 0.1–1.0)
Monocytes Relative: 10 %
Neutro Abs: 4.4 10*3/uL (ref 1.7–7.7)
Neutrophils Relative %: 64 %
Platelets: 194 10*3/uL (ref 150–400)
RBC: 4.16 MIL/uL — ABNORMAL LOW (ref 4.22–5.81)
RDW: 12.7 % (ref 11.5–15.5)
WBC: 6.9 10*3/uL (ref 4.0–10.5)
nRBC: 0 % (ref 0.0–0.2)

## 2019-01-27 LAB — CARBAMAZEPINE LEVEL, TOTAL: Carbamazepine Lvl: 10.4 ug/mL (ref 4.0–12.0)

## 2019-01-27 LAB — RAPID URINE DRUG SCREEN, HOSP PERFORMED
Amphetamines: NOT DETECTED
Barbiturates: NOT DETECTED
Benzodiazepines: NOT DETECTED
Cocaine: NOT DETECTED
Opiates: NOT DETECTED
Tetrahydrocannabinol: NOT DETECTED

## 2019-01-27 LAB — BLOOD GAS, VENOUS
Acid-Base Excess: 2.8 mmol/L — ABNORMAL HIGH (ref 0.0–2.0)
Bicarbonate: 25.6 mmol/L (ref 20.0–28.0)
FIO2: 21
O2 Saturation: 62.1 %
Patient temperature: 36.4
pCO2, Ven: 48.6 mmHg (ref 44.0–60.0)
pH, Ven: 7.373 (ref 7.250–7.430)
pO2, Ven: 35.1 mmHg (ref 32.0–45.0)

## 2019-01-27 LAB — AMMONIA: Ammonia: 33 umol/L (ref 9–35)

## 2019-01-27 NOTE — ED Provider Notes (Signed)
[facility] EMERGENCY DEPARTMENT Provider Note   CSN: [id number] Arrival date & time: 01/27/19  1323     History   Chief Complaint Chief Complaint  Patient presents with   Altered Mental Status    HPI Jeremy Parks is a 68 y.o. male.     Patient with diabetes, cerebral palsy, seizures, lives in assisted living, Tegretol presents after being found slumped over at 1230 this afternoon.  Patient was last seen normal at breakfast at 9:00 in the morning.  Patient had seizure yesterday evening per patient however no witnessed seizure activity today.  No witnessed head injury.  No fevers or other concerns per report.     Past Medical History:  Diagnosis Date   Cerebral palsy ([place])    Diabetes mellitus without complication ([place])    Gingivitis    Lipoma of abdominal wall    Seizures ([place])     Patient Active Problem List   Diagnosis Date Noted   HTN (hypertension) 10/30/2018   Hypertensive urgency 10/28/2018   Cerebral palsy ([place])    Diabetes mellitus without complication ([place])    Seizures ([place])     History reviewed. No pertinent surgical history.      Home Medications    Prior to Admission medications   Medication Sig Start Date End Date Taking? Authorizing Provider  acetaminophen (TYLENOL) 500 MG tablet Take 500 mg by mouth every 8 (eight) hours.   Yes [provider]  amLODipine (NORVASC) 10 MG tablet Take 1 tablet (10 mg total) by mouth daily. 10/31/18  Yes Sinda Du, MD  atorvastatin (LIPITOR) 40 MG tablet Take 40 mg by mouth at bedtime.    Yes [provider]  carbamazepine (TEGRETOL) 200 MG tablet Take 1.5 tablets (300 mg total) by mouth 2 (two) times daily. 10/31/16  Yes Forde Dandy, MD  cephALEXin (KEFLEX) 500 MG capsule Take 1 capsule by mouth 3 (three) times daily. 01/18/19  Yes [provider]  chlorhexidine (PERIDEX) 0.12 % solution Use as directed 15 mLs in the mouth or throat daily. **Swish 15 mls for 30 seconds  then spit out. USE DAILY**   Yes [provider]  docusate sodium (COLACE) 100 MG capsule Take 100 mg by mouth 2 (two) times daily.   Yes [provider]  fluticasone (FLONASE) 50 MCG/ACT nasal spray Place 2 sprays into both nostrils daily.   Yes [provider]  Insulin Glargine (BASAGLAR KWIKPEN [place]) Inject 20 Units into the skin at bedtime.   Yes [provider]  nystatin (MYCOSTATIN/NYSTOP) powder Apply topically 4 (four) times daily. Apply topically to affected area of groin twice daily as needed for rash.   Yes [provider]    Family History Family History  Family history unknown: Yes    Social History Social History   Tobacco Use   Smoking status: Never Smoker   Smokeless tobacco: Never Used  Substance Use Topics   Alcohol use: No   Drug use: No     Allergies   Patient has no known allergies.   Review of Systems Review of Systems  Unable to perform ROS: Mental status change     Physical Exam Updated Vital Signs BP (!) 147/90    Pulse 85    Temp 97.6 F (36.4 C) (Oral)    Resp 20    SpO2 96%   Physical Exam Vitals signs and nursing note reviewed.  Constitutional:      Appearance: He is well-developed.  HENT:  Head: Normocephalic and atraumatic.  Eyes:     General:        Right eye: No discharge.        Left eye: No discharge.     Conjunctiva/sclera: Conjunctivae normal.  Neck:     Musculoskeletal: Normal range of motion and neck supple.     Trachea: No tracheal deviation.  Cardiovascular:     Rate and Rhythm: Normal rate.  Pulmonary:     Effort: Pulmonary effort is normal.  Abdominal:     General: There is no distension.     Palpations: Abdomen is soft.     Tenderness: There is no abdominal tenderness. There is no guarding.  Skin:    General: Skin is warm.     Findings: No rash.  Neurological:     Mental Status: He is alert.     Comments: Mild generalized postictal type presentation. Patient  with CP follows commands to loud verbal holds both arms up equally without any weakness or arm drift, pupils equal bilateral horizontal eye movements intact.  Patient moves both legs with mild general weakness.  Sensation intact bilateral. No facial droop appreciated.  Psychiatric:        Mood and Affect: Affect is blunt.        Behavior: Behavior is slowed.      ED Treatments / Results  Labs (all labs ordered are listed, but only abnormal results are displayed) Labs Reviewed  COMPREHENSIVE METABOLIC PANEL - Abnormal; Notable for the following components:      Result Value   Glucose, Bld 147 (*)    Calcium 8.6 (*)    All other components within normal limits  CBC WITH DIFFERENTIAL/PLATELET - Abnormal; Notable for the following components:   RBC 4.16 (*)    Hemoglobin 12.3 (*)    HCT 37.3 (*)    All other components within normal limits  BLOOD GAS, VENOUS - Abnormal; Notable for the following components:   Acid-Base Excess 2.8 (*)    All other components within normal limits  URINALYSIS, ROUTINE W REFLEX MICROSCOPIC - Abnormal; Notable for the following components:   Protein, ur 100 (*)    All other components within normal limits  SARS CORONAVIRUS 2 (TAT 6-24 HRS)  CARBAMAZEPINE LEVEL, TOTAL  AMMONIA  RAPID URINE DRUG SCREEN, HOSP PERFORMED    EKG EKG Interpretation  Date/Time:  Friday January 27 2019 13:38:00 EDT Ventricular Rate:  84 PR Interval:    QRS Duration: 91 QT Interval:  387 QTC Calculation: 458 R Axis:   33 Text Interpretation:  Sinus rhythm Abnormal R-wave progression, early transition Borderline T wave abnormalities Confirmed by Elnora Morrison [id number]) on 01/27/2019 3:28:52 PM   Radiology Ct Head Wo Contrast  Result Date: 01/27/2019 CLINICAL DATA:  Altered mental status EXAM: CT HEAD WITHOUT CONTRAST TECHNIQUE: Contiguous axial images were obtained from the base of the skull through the vertex without intravenous contrast. COMPARISON:  10/28/2018  FINDINGS: Brain: No evidence of acute infarction, hemorrhage, hydrocephalus, extra-axial collection or mass lesion/mass effect. Scattered low-density changes within the periventricular and subcortical white matter compatible with chronic microvascular ischemic change. Mild-moderate diffuse cerebral volume loss. Vascular: Mild atherosclerotic calcifications involving the large vessels of the skull base. No unexpected hyperdense vessel. Skull: Normal. Negative for fracture or focal lesion. Sinuses/Orbits: No acute finding. Other: None. IMPRESSION: 1.  No acute intracranial findings. 2.  Chronic microvascular ischemic change and cerebral volume loss. Electronically Signed   By: Davina Poke M.D.   On: 01/27/2019  15:46    Procedures Procedures (including critical care time)  Medications Ordered in ED Medications - No data to display   Initial Impression / Assessment and Plan / ED Course  I have reviewed the triage vital signs and the nursing notes.  Pertinent labs & imaging results that were available during my care of the patient were reviewed by me and considered in my medical decision making (see chart for details).       Patient presents after unwitnessed event clinical concern for seizure giving general weakness/postictal type presentation no focal deficits.  Patient does have seizure history and as he is becoming more alert he does report seizure yesterday and he felt it coming on.  Plan for observation, EKG, CT scan of the head, blood work and observation in the emergency room.  Blood work reviewed sodium and glucose normal, hemoglobin 12.3. Patient's care signed out to continue further work-up observation reassess.   Final Clinical Impressions(s) / ED Diagnoses   Final diagnoses:  Acute encephalopathy  Seizure  ED Discharge Orders    None       Elnora Morrison, MD 01/27/19 [phone]

## 2019-01-27 NOTE — ED Triage Notes (Signed)
Pt brought from [facility] reports that staff says pt was "normal " at breakfast which is approx 9 am . Pt found in room slumped to right. approxox 1230 . Pt hard to arouse per EMS.

## 2019-01-27 NOTE — Discharge Instructions (Addendum)
Follow-up with your doctor next week.  Make sure you take your seizure medicine

## 2019-01-27 NOTE — ED Notes (Signed)
Left message for Legal guardian with Dept. Of social services. Left message

## 2019-01-27 NOTE — ED Notes (Signed)
Call to communications for transport back to [facility].  Spoke with [employer] and updated Guardian.

## 2019-01-28 LAB — SARS CORONAVIRUS 2 (TAT 6-24 HRS): SARS Coronavirus 2: NEGATIVE

## 2019-01-30 DIAGNOSIS — G809 Cerebral palsy, unspecified: Secondary | ICD-10-CM | POA: Diagnosis not present

## 2019-01-30 DIAGNOSIS — R278 Other lack of coordination: Secondary | ICD-10-CM | POA: Diagnosis not present

## 2019-01-31 DIAGNOSIS — M79674 Pain in right toe(s): Secondary | ICD-10-CM | POA: Diagnosis not present

## 2019-01-31 DIAGNOSIS — B351 Tinea unguium: Secondary | ICD-10-CM | POA: Diagnosis not present

## 2019-01-31 DIAGNOSIS — M79675 Pain in left toe(s): Secondary | ICD-10-CM | POA: Diagnosis not present

## 2019-02-01 DIAGNOSIS — G809 Cerebral palsy, unspecified: Secondary | ICD-10-CM | POA: Diagnosis not present

## 2019-02-01 DIAGNOSIS — R569 Unspecified convulsions: Secondary | ICD-10-CM | POA: Diagnosis not present

## 2019-02-01 DIAGNOSIS — R278 Other lack of coordination: Secondary | ICD-10-CM | POA: Diagnosis not present

## 2019-02-01 DIAGNOSIS — E1165 Type 2 diabetes mellitus with hyperglycemia: Secondary | ICD-10-CM | POA: Diagnosis not present

## 2019-02-01 DIAGNOSIS — I10 Essential (primary) hypertension: Secondary | ICD-10-CM | POA: Diagnosis not present

## 2019-04-04 DIAGNOSIS — M79674 Pain in right toe(s): Secondary | ICD-10-CM | POA: Diagnosis not present

## 2019-04-04 DIAGNOSIS — M79675 Pain in left toe(s): Secondary | ICD-10-CM | POA: Diagnosis not present

## 2019-04-04 DIAGNOSIS — B351 Tinea unguium: Secondary | ICD-10-CM | POA: Diagnosis not present

## 2019-04-06 DIAGNOSIS — Z20828 Contact with and (suspected) exposure to other viral communicable diseases: Secondary | ICD-10-CM | POA: Diagnosis not present

## 2019-04-06 DIAGNOSIS — U071 COVID-19: Secondary | ICD-10-CM | POA: Diagnosis not present

## 2019-04-10 DIAGNOSIS — Z20828 Contact with and (suspected) exposure to other viral communicable diseases: Secondary | ICD-10-CM | POA: Diagnosis not present

## 2019-04-10 DIAGNOSIS — U071 COVID-19: Secondary | ICD-10-CM | POA: Diagnosis not present

## 2019-04-12 ENCOUNTER — Emergency Department (HOSPITAL_COMMUNITY)
Admission: EM | Admit: 2019-04-12 | Discharge: 2019-04-13 | Disposition: A | Discharge status: Skilled Nursing Facility | Payer: Medicare Other | Attending: Emergency Medicine | Admitting: Emergency Medicine

## 2019-04-12 ENCOUNTER — Other Ambulatory Visit: Payer: Self-pay

## 2019-04-12 ENCOUNTER — Encounter (HOSPITAL_COMMUNITY): Payer: Self-pay | Admitting: *Deleted

## 2019-04-12 ENCOUNTER — Emergency Department (HOSPITAL_COMMUNITY): Payer: Medicare Other

## 2019-04-12 DIAGNOSIS — G809 Cerebral palsy, unspecified: Secondary | ICD-10-CM | POA: Insufficient documentation

## 2019-04-12 DIAGNOSIS — I10 Essential (primary) hypertension: Secondary | ICD-10-CM | POA: Diagnosis not present

## 2019-04-12 DIAGNOSIS — Z79899 Other long term (current) drug therapy: Secondary | ICD-10-CM | POA: Insufficient documentation

## 2019-04-12 DIAGNOSIS — E119 Type 2 diabetes mellitus without complications: Secondary | ICD-10-CM | POA: Diagnosis not present

## 2019-04-12 DIAGNOSIS — R531 Weakness: Secondary | ICD-10-CM | POA: Diagnosis not present

## 2019-04-12 DIAGNOSIS — K5900 Constipation, unspecified: Secondary | ICD-10-CM | POA: Diagnosis not present

## 2019-04-12 DIAGNOSIS — R112 Nausea with vomiting, unspecified: Secondary | ICD-10-CM | POA: Insufficient documentation

## 2019-04-12 MED ORDER — ONDANSETRON HCL 4 MG/2ML IJ SOLN
4.0000 mg | Freq: Once | INTRAMUSCULAR | Status: AC
  Administered 2019-04-13: 4 mg via INTRAVENOUS
  Filled 2019-04-12: qty 2

## 2019-04-12 MED ORDER — SODIUM CHLORIDE 0.9 % IV BOLUS
500.0000 mL | Freq: Once | INTRAVENOUS | Status: AC
  Administered 2019-04-13: 500 mL via INTRAVENOUS

## 2019-04-12 NOTE — ED Triage Notes (Signed)
Pt brought in by [facility] for c/o vomiting x 2 today; pt comes from [facility] where there is an outbreak of covid; pt poor historian

## 2019-04-13 ENCOUNTER — Emergency Department (HOSPITAL_COMMUNITY): Payer: Medicare Other

## 2019-04-13 DIAGNOSIS — R112 Nausea with vomiting, unspecified: Secondary | ICD-10-CM | POA: Diagnosis not present

## 2019-04-13 LAB — COMPREHENSIVE METABOLIC PANEL
ALT: 24 U/L (ref 0–44)
AST: 21 U/L (ref 15–41)
Albumin: 4.3 g/dL (ref 3.5–5.0)
Alkaline Phosphatase: 110 U/L (ref 38–126)
Anion gap: 10 (ref 5–15)
BUN: 18 mg/dL (ref 8–23)
CO2: 26 mmol/L (ref 22–32)
Calcium: 8.9 mg/dL (ref 8.9–10.3)
Chloride: 102 mmol/L (ref 98–111)
Creatinine, Ser: 1 mg/dL (ref 0.61–1.24)
GFR calc Af Amer: >60 mL/min (ref >60)
GFR calc non Af Amer: >60 mL/min (ref >60)
Glucose, Bld: 136 mg/dL — ABNORMAL HIGH (ref 70–99)
Potassium: 4.1 mmol/L (ref 3.5–5.1)
Sodium: 138 mmol/L (ref 135–145)
Total Bilirubin: 0.2 mg/dL — ABNORMAL LOW (ref 0.3–1.2)
Total Protein: 8.1 g/dL (ref 6.5–8.1)

## 2019-04-13 LAB — URINALYSIS, ROUTINE W REFLEX MICROSCOPIC
Bacteria, UA: NONE SEEN
Bilirubin Urine: NEGATIVE
Glucose, UA: NEGATIVE mg/dL
Hgb urine dipstick: NEGATIVE
Ketones, ur: NEGATIVE mg/dL
Leukocytes,Ua: NEGATIVE
Nitrite: NEGATIVE
Protein, ur: 100 mg/dL — AB
Specific Gravity, Urine: 1.041 — ABNORMAL HIGH (ref 1.005–1.030)
pH: 5 (ref 5.0–8.0)

## 2019-04-13 LAB — CBC WITH DIFFERENTIAL/PLATELET
Abs Immature Granulocytes: 0.01 10*3/uL (ref 0.00–0.07)
Basophils Absolute: 0 10*3/uL (ref 0.0–0.1)
Basophils Relative: 0 %
Eosinophils Absolute: 0.2 10*3/uL (ref 0.0–0.5)
Eosinophils Relative: 3 %
HCT: 38.9 % — ABNORMAL LOW (ref 39.0–52.0)
Hemoglobin: 12.4 g/dL — ABNORMAL LOW (ref 13.0–17.0)
Immature Granulocytes: 0 %
Lymphocytes Relative: 36 %
Lymphs Abs: 2 10*3/uL (ref 0.7–4.0)
MCH: 28.8 pg (ref 26.0–34.0)
MCHC: 31.9 g/dL (ref 30.0–36.0)
MCV: 90.5 fL (ref 80.0–100.0)
Monocytes Absolute: 0.4 10*3/uL (ref 0.1–1.0)
Monocytes Relative: 8 %
Neutro Abs: 2.9 10*3/uL (ref 1.7–7.7)
Neutrophils Relative %: 53 %
Platelets: 195 10*3/uL (ref 150–400)
RBC: 4.3 MIL/uL (ref 4.22–5.81)
RDW: 12.5 % (ref 11.5–15.5)
WBC: 5.5 10*3/uL (ref 4.0–10.5)
nRBC: 0 % (ref 0.0–0.2)

## 2019-04-13 LAB — LIPASE, BLOOD: Lipase: 26 U/L (ref 11–51)

## 2019-04-13 MED ORDER — IOHEXOL 300 MG/ML  SOLN
100.0000 mL | Freq: Once | INTRAMUSCULAR | Status: AC | PRN
  Administered 2019-04-13: 100 mL via INTRAVENOUS

## 2019-04-13 NOTE — ED Provider Notes (Signed)
[facility] EMERGENCY DEPARTMENT Provider Note   CSN: [id number] Arrival date & time: 04/12/19  2234     History   Chief Complaint Chief Complaint  Patient presents with   Emesis    HPI Jeremy Parks is a 68 y.o. male.     Patient presents to the emergency department for evaluation of nausea and vomiting.  Patient sent to the emergency department from skilled nursing facility where he resides.  Staff at the nursing facility told EMS that there is a Covid outbreak.  Patient has had 2 episodes of nonbloody emesis.  He reports that he has some lower abdominal and lower back discomfort.  Patient is a poor historian.     Past Medical History:  Diagnosis Date   Cerebral palsy ([place])    Diabetes mellitus without complication ([place])    Gingivitis    Lipoma of abdominal wall    Seizures ([place])     Patient Active Problem List   Diagnosis Date Noted   HTN (hypertension) 10/30/2018   Hypertensive urgency 10/28/2018   Cerebral palsy ([place])    Diabetes mellitus without complication ([place])    Seizures ([place])     History reviewed. No pertinent surgical history.      Home Medications    Prior to Admission medications   Medication Sig Start Date End Date Taking? Authorizing Provider  acetaminophen (TYLENOL) 500 MG tablet Take 500 mg by mouth every 8 (eight) hours.    [provider]  amLODipine (NORVASC) 10 MG tablet Take 1 tablet (10 mg total) by mouth daily. 10/31/18   Sinda Du, MD  atorvastatin (LIPITOR) 40 MG tablet Take 40 mg by mouth at bedtime.     [provider]  carbamazepine (TEGRETOL) 200 MG tablet Take 1.5 tablets (300 mg total) by mouth 2 (two) times daily. 10/31/16   Forde Dandy, MD  cephALEXin (KEFLEX) 500 MG capsule Take 1 capsule by mouth 3 (three) times daily. 01/18/19   [provider]  chlorhexidine (PERIDEX) 0.12 % solution Use as directed 15 mLs in the mouth or throat daily. **Swish 15 mls for 30 seconds then spit  out. USE DAILY**    [provider]  docusate sodium (COLACE) 100 MG capsule Take 100 mg by mouth 2 (two) times daily.    [provider]  fluticasone (FLONASE) 50 MCG/ACT nasal spray Place 2 sprays into both nostrils daily.    [provider]  Insulin Glargine (BASAGLAR KWIKPEN [place]) Inject 20 Units into the skin at bedtime.    [provider]  nystatin (MYCOSTATIN/NYSTOP) powder Apply topically 4 (four) times daily. Apply topically to affected area of groin twice daily as needed for rash.    [provider]    Family History Family History  Family history unknown: Yes    Social History Social History   Tobacco Use   Smoking status: Never Smoker   Smokeless tobacco: Never Used  Substance Use Topics   Alcohol use: No   Drug use: No     Allergies   Patient has no known allergies.   Review of Systems Review of Systems  Gastrointestinal: Positive for abdominal pain, nausea and vomiting.  Musculoskeletal: Positive for back pain.  All other systems reviewed and are negative.    Physical Exam Updated Vital Signs BP (!) 144/75    Pulse 82    Temp 97.7 F (36.5 C) (Oral)    Resp 12    SpO2 97%   Physical Exam Vitals  signs and nursing note reviewed.  Constitutional:      General: He is not in acute distress.    Appearance: Normal appearance. He is well-developed.  HENT:     Head: Normocephalic and atraumatic.     Right Ear: Hearing normal.     Left Ear: Hearing normal.     Nose: Nose normal.  Eyes:     Conjunctiva/sclera: Conjunctivae normal.     Pupils: Pupils are equal, round, and reactive to light.  Neck:     Musculoskeletal: Normal range of motion and neck supple.  Cardiovascular:     Rate and Rhythm: Regular rhythm.     Heart sounds: S1 normal and S2 normal. No murmur. No friction rub. No gallop.   Pulmonary:     Effort: Pulmonary effort is normal. No respiratory distress.     Breath sounds: Normal breath sounds.    Chest:     Chest wall: No tenderness.  Abdominal:     General: Bowel sounds are normal.     Palpations: Abdomen is soft.     Tenderness: There is generalized abdominal tenderness. There is no guarding or rebound. Negative signs include Murphy's sign and McBurney's sign.     Hernia: No hernia is present.  Musculoskeletal: Normal range of motion.  Skin:    General: Skin is warm and dry.     Findings: No rash.  Neurological:     Mental Status: He is alert.     GCS: GCS eye subscore is 4. GCS verbal subscore is 5. GCS motor subscore is 6.     Cranial Nerves: No cranial nerve deficit.     Sensory: No sensory deficit.     Coordination: Coordination normal.  Psychiatric:        Speech: Speech normal.        Behavior: Behavior normal.        Thought Content: Thought content normal.      ED Treatments / Results  Labs (all labs ordered are listed, but only abnormal results are displayed) Labs Reviewed  CBC WITH DIFFERENTIAL/PLATELET - Abnormal; Notable for the following components:      Result Value   Hemoglobin 12.4 (*)    HCT 38.9 (*)    All other components within normal limits  COMPREHENSIVE METABOLIC PANEL - Abnormal; Notable for the following components:   Glucose, Bld 136 (*)    Total Bilirubin 0.2 (*)    All other components within normal limits  LIPASE, BLOOD  URINALYSIS, ROUTINE W REFLEX MICROSCOPIC    EKG EKG Interpretation  Date/Time:  Thursday April 13 2019 00:05:20 EST Ventricular Rate:  79 PR Interval:    QRS Duration: 117 QT Interval:  385 QTC Calculation: 442 R Axis:   34 Text Interpretation: Sinus rhythm Nonspecific intraventricular conduction delay Confirmed by Orpah Greek [phone]) on 04/13/2019 12:26:16 AM   Radiology CT ABDOMEN PELVIS W CONTRAST  Result Date: 04/13/2019 CLINICAL DATA:  Acute abdominal pain with nausea and vomiting EXAM: CT ABDOMEN AND PELVIS WITH CONTRAST TECHNIQUE: Multidetector CT imaging of the abdomen and pelvis  was performed using the standard protocol following bolus administration of intravenous contrast. CONTRAST:  158mL OMNIPAQUE IOHEXOL 300 MG/ML  SOLN COMPARISON:  None. FINDINGS: Lower chest: No acute abnormality. Hepatobiliary: The liver is unremarkable. There is cholelithiasis without CT evidence for acute cholecystitis. Pancreas: Unremarkable. No pancreatic ductal dilatation or surrounding inflammatory changes. Spleen: Normal in size without focal abnormality. Adrenals/Urinary Tract: Adrenal glands are unremarkable. Kidneys are normal, without renal  calculi, focal lesion, or hydronephrosis. Bladder is unremarkable. Stomach/Bowel: There is mild wall thickening of the distal esophagus. The stomach is unremarkable. There is no evidence for a bowel obstruction. There is an above average amount of stool throughout the colon. The appendix is not reliably identified, however there are no inflammatory changes in the right lower quadrant. Vascular/Lymphatic: No significant vascular findings are present. No enlarged abdominal or pelvic lymph nodes. The left common iliac vein, left external iliac vein, and left common femoral vein appear atretic and are likely chronically occluded. This is not significantly changed in appearance when compared to prior CT. Reproductive: Prostate is unremarkable. Other: There are bilateral fat containing inguinal hernias. There is no free air free fluid. There are calcifications in the anterior left abdominal wall, likely related to degeneration of a lipoma in this location. Musculoskeletal: No acute or significant osseous findings. IMPRESSION: 1. No definite acute abnormality. 2. Cholelithiasis without CT evidence for acute cholecystitis. 3. Mild wall thickening of the distal esophagus is noted which can be seen in patients with esophagitis. Correlation with patient symptoms and history is recommended. 4. Large stool burden. 5. Apparent chronic occlusion of the left common iliac vein which  may be secondary to May-Thurner syndrome. This is unchanged since 2016 but new since 2014. If the patient has left lower extremity swelling, this may account for those symptoms. Electronically Signed   By: Constance Holster M.D.   On: 04/13/2019 03:50   DG Chest Port 1 View  Result Date: 04/12/2019 CLINICAL DATA:  Weakness and recent COVID-19 exposure EXAM: PORTABLE CHEST 1 VIEW COMPARISON:  10/28/2018 FINDINGS: Cardiac shadow is stable. The lungs are well aerated bilaterally. No focal infiltrate or sizable effusion is seen. No bony abnormality is noted. IMPRESSION: No active disease. Electronically Signed   By: Inez Catalina M.D.   On: 04/12/2019 23:49    Procedures Procedures (including critical care time)  Medications Ordered in ED Medications  sodium chloride 0.9 % bolus 500 mL (0 mLs Intravenous Stopped 04/13/19 0048)  ondansetron (ZOFRAN) injection 4 mg (4 mg Intravenous Given 04/13/19 0007)  iohexol (OMNIPAQUE) 300 MG/ML solution 100 mL (100 mLs Intravenous Contrast Given 04/13/19 0110)     Initial Impression / Assessment and Plan / ED Course  I have reviewed the triage vital signs and the nursing notes.  Pertinent labs & imaging results that were available during my care of the patient were reviewed by me and considered in my medical decision making (see chart for details).        Patient presents to the ER for evaluation of nausea and vomiting.  He comes to the ER from nursing home.  He had 2 episodes of emesis.  Patient is a poor historian, does report that he has mild discomfort in his abdomen.  Exam revealed mild tenderness without guarding or rebound.  Lab work was unremarkable.  Patient underwent CT scan which shows constipation and some chronic changes, no other acute pathology.  Final Clinical Impressions(s) / ED Diagnoses   Final diagnoses:  Non-intractable vomiting with nausea, unspecified vomiting type  Constipation, unspecified constipation type    ED Discharge  Orders    None       Orpah Greek, MD 04/13/19 [phone]

## 2019-04-13 NOTE — ED Notes (Signed)
Pt left with [facility]

## 2019-04-13 NOTE — ED Notes (Signed)
Called and gave report to staff at [facility]; stated someone will be here shortly to pick up patient

## 2019-04-21 ENCOUNTER — Emergency Department (HOSPITAL_COMMUNITY)
Admission: EM | Admit: 2019-04-21 | Discharge: 2019-04-21 | Disposition: A | Discharge status: Home / Self Care | Payer: Medicare Other | Attending: Emergency Medicine | Admitting: Emergency Medicine

## 2019-04-21 ENCOUNTER — Emergency Department (HOSPITAL_COMMUNITY): Payer: Medicare Other

## 2019-04-21 ENCOUNTER — Other Ambulatory Visit: Payer: Self-pay

## 2019-04-21 ENCOUNTER — Encounter (HOSPITAL_COMMUNITY): Payer: Self-pay | Admitting: *Deleted

## 2019-04-21 DIAGNOSIS — S7002XA Contusion of left hip, initial encounter: Secondary | ICD-10-CM | POA: Diagnosis not present

## 2019-04-21 DIAGNOSIS — Z79899 Other long term (current) drug therapy: Secondary | ICD-10-CM | POA: Insufficient documentation

## 2019-04-21 DIAGNOSIS — R4781 Slurred speech: Secondary | ICD-10-CM | POA: Insufficient documentation

## 2019-04-21 DIAGNOSIS — I6782 Cerebral ischemia: Secondary | ICD-10-CM | POA: Diagnosis not present

## 2019-04-21 DIAGNOSIS — Y939 Activity, unspecified: Secondary | ICD-10-CM | POA: Insufficient documentation

## 2019-04-21 DIAGNOSIS — S7012XA Contusion of left thigh, initial encounter: Secondary | ICD-10-CM | POA: Insufficient documentation

## 2019-04-21 DIAGNOSIS — Z794 Long term (current) use of insulin: Secondary | ICD-10-CM | POA: Diagnosis not present

## 2019-04-21 DIAGNOSIS — W07XXXA Fall from chair, initial encounter: Secondary | ICD-10-CM | POA: Diagnosis not present

## 2019-04-21 DIAGNOSIS — Y999 Unspecified external cause status: Secondary | ICD-10-CM | POA: Diagnosis not present

## 2019-04-21 DIAGNOSIS — W19XXXA Unspecified fall, initial encounter: Secondary | ICD-10-CM

## 2019-04-21 DIAGNOSIS — S79912A Unspecified injury of left hip, initial encounter: Secondary | ICD-10-CM | POA: Diagnosis present

## 2019-04-21 DIAGNOSIS — I10 Essential (primary) hypertension: Secondary | ICD-10-CM | POA: Diagnosis not present

## 2019-04-21 DIAGNOSIS — Y92121 Bathroom in nursing home as the place of occurrence of the external cause: Secondary | ICD-10-CM | POA: Insufficient documentation

## 2019-04-21 DIAGNOSIS — E119 Type 2 diabetes mellitus without complications: Secondary | ICD-10-CM | POA: Insufficient documentation

## 2019-04-21 LAB — CBG MONITORING, ED: Glucose-Capillary: 88 mg/dL (ref 70–99)

## 2019-04-21 NOTE — ED Notes (Signed)
Patient transported to CT 

## 2019-04-21 NOTE — ED Notes (Signed)
RN called [facility] to check on status of Pts transportation return to their facility following discharge. Facility staff report they are still working on finding someone to provide Pt transportation back to their facility.

## 2019-04-21 NOTE — ED Triage Notes (Signed)
[facility] called [facility] r/t fear pt had stroke.  upon arrival patient A&O x3 stated he fell in the bathroom around 10pm. States he is not in any pain at this time. IV established, CT completed, code stroke not yet called.

## 2019-04-21 NOTE — ED Notes (Signed)
Called [facility] to see ETA for them coming to get the Pt.  Informed," that Pt is non ambulatory and we need to call EMS to transport Pt back to them"  Nurse informed nurse.  Wells Guiles (EMS sceduler) called for transport and Pt is on their list.

## 2019-04-21 NOTE — ED Provider Notes (Signed)
[facility] EMERGENCY DEPARTMENT Provider Note   CSN: [id number] Arrival date & time: 04/21/19  0049     History Chief Complaint  Patient presents with  . Fall    Jeremy Parks is a 68 y.o. male.  Patient brought to the emergency department by EMS from nursing home.  EMS report that they were called to the nursing home by staff he came on at 11:00 tonight and thought that the patient had slurred speech.  Medics report an NIH score of 0, no signs of stroke.  At arrival to the ER, patient states that he was sitting on a stool in the bathroom around 10 PM and slipped off, hitting the floor.  He has been having pain in the left hip ever since.  Patient denies numbness, tingling, weakness, slurred speech.        Past Medical History:  Diagnosis Date  . Cerebral palsy ([place])   . Diabetes mellitus without complication ([place])   . Gingivitis   . Lipoma of abdominal wall   . Seizures [facility])     Patient Active Problem List   Diagnosis Date Noted  . HTN (hypertension) 10/30/2018  . Hypertensive urgency 10/28/2018  . Cerebral palsy ([place])   . Diabetes mellitus without complication ([place])   . Seizures ([place])     History reviewed. No pertinent surgical history.     Family History  Family history unknown: Yes    Social History   Tobacco Use  . Smoking status: Never Smoker  . Smokeless tobacco: Never Used  Substance Use Topics  . Alcohol use: No  . Drug use: No    Home Medications Prior to Admission medications   Medication Sig Start Date End Date Taking? Authorizing Provider  acetaminophen (TYLENOL) 500 MG tablet Take 500 mg by mouth every 8 (eight) hours.    [provider]  amLODipine (NORVASC) 10 MG tablet Take 1 tablet (10 mg total) by mouth daily. 10/31/18   Sinda Du, MD  atorvastatin (LIPITOR) 40 MG tablet Take 40 mg by mouth at bedtime.     [provider]  carbamazepine (TEGRETOL) 200 MG tablet Take 1.5 tablets (300 mg total) by mouth 2  (two) times daily. 10/31/16   Forde Dandy, MD  cephALEXin (KEFLEX) 500 MG capsule Take 1 capsule by mouth 3 (three) times daily. 01/18/19   [provider]  chlorhexidine (PERIDEX) 0.12 % solution Use as directed 15 mLs in the mouth or throat daily. **Swish 15 mls for 30 seconds then spit out. USE DAILY**    [provider]  docusate sodium (COLACE) 100 MG capsule Take 100 mg by mouth 2 (two) times daily.    [provider]  fluticasone (FLONASE) 50 MCG/ACT nasal spray Place 2 sprays into both nostrils daily.    [provider]  Insulin Glargine (BASAGLAR KWIKPEN [place]) Inject 20 Units into the skin at bedtime.    [provider]  nystatin (MYCOSTATIN/NYSTOP) powder Apply topically 4 (four) times daily. Apply topically to affected area of groin twice daily as needed for rash.    [provider]    Allergies    Patient has no known allergies.  Review of Systems   Review of Systems  Musculoskeletal: Positive for arthralgias.  All other systems reviewed and are negative.   Physical Exam Updated Vital Signs BP (!) 143/76   Pulse 96   Temp 98.2 F (36.8 C) (Oral)   Resp 14   Ht 5\' 6"  (1.676 m)  SpO2 94%   BMI 27.43 kg/m   Physical Exam Vitals and nursing note reviewed.  Constitutional:      General: He is not in acute distress.    Appearance: Normal appearance. He is well-developed.  HENT:     Head: Normocephalic and atraumatic.     Right Ear: Hearing normal.     Left Ear: Hearing normal.     Nose: Nose normal.  Eyes:     Conjunctiva/sclera: Conjunctivae normal.     Pupils: Pupils are equal, round, and reactive to light.  Cardiovascular:     Rate and Rhythm: Regular rhythm.     Heart sounds: S1 normal and S2 normal. No murmur. No friction rub. No gallop.   Pulmonary:     Effort: Pulmonary effort is normal. No respiratory distress.     Breath sounds: Normal breath sounds.  Chest:     Chest wall: No tenderness.    Abdominal:     General: Bowel sounds are normal.     Palpations: Abdomen is soft.     Tenderness: There is no abdominal tenderness. There is no guarding or rebound. Negative signs include Murphy's sign and McBurney's sign.     Hernia: No hernia is present.  Musculoskeletal:        General: Normal range of motion.     Cervical back: Normal range of motion and neck supple.     Left hip: Tenderness present.  Skin:    General: Skin is warm and dry.     Findings: No rash.  Neurological:     Mental Status: He is alert and oriented to person, place, and time.     GCS: GCS eye subscore is 4. GCS verbal subscore is 5. GCS motor subscore is 6.     Cranial Nerves: No cranial nerve deficit.     Sensory: No sensory deficit.     Comments: Baseline paraplegia, no left upper extremity weakness or altered sensation  Psychiatric:        Speech: Speech normal.        Behavior: Behavior normal.        Thought Content: Thought content normal.     ED Results / Procedures / Treatments   Labs (all labs ordered are listed, but only abnormal results are displayed) Labs Reviewed  CBG MONITORING, ED    EKG None  Radiology CT Head Wo Contrast  Result Date: 04/21/2019 CLINICAL DATA:  Sudden onset of weakness. EXAM: CT HEAD WITHOUT CONTRAST TECHNIQUE: Contiguous axial images were obtained from the base of the skull through the vertex without intravenous contrast. COMPARISON:  01/27/2019 FINDINGS: Brain: No evidence of acute infarction, hemorrhage, hydrocephalus, extra-axial collection or mass lesion/mass effect. Mild atrophy and chronic microvascular ischemic changes are again noted. Vascular: No hyperdense vessel or unexpected calcification. Skull: Normal. Negative for fracture or focal lesion. Sinuses/Orbits: No acute finding. Other: None. IMPRESSION: No acute intracranial abnormality. Electronically Signed   By: Constance Holster M.D.   On: 04/21/2019 01:21   DG Hip Unilat W or Wo Pelvis 2-3 Views  Left  Result Date: 04/21/2019 CLINICAL DATA:  Left hip pain after fall. EXAM: DG HIP (WITH OR WITHOUT PELVIS) 2-3V LEFT COMPARISON:  None. FINDINGS: Bones are under mineralized. Patient had difficulty with positioning due to lower extremity contractures. The cortical margins of the bony pelvis are intact. No evidence fracture. Pubic symphysis and sacroiliac joints are congruent. Bilateral acetabular spurring. IMPRESSION: No evidence of pelvic or left hip fracture. Electronically Signed   By:  Keith Rake M.D.   On: 04/21/2019 02:23    Procedures Procedures (including critical care time)  Medications Ordered in ED Medications - No data to display  ED Course  I have reviewed the triage vital signs and the nursing notes.  Pertinent labs & imaging results that were available during my care of the patient were reviewed by me and considered in my medical decision making (see chart for details).    MDM Rules/Calculators/A&P                      Patient sent to the emergency department from nursing home.  There was some confusion initially as to why the patient was even sent to the ER.  EMS reported that staff was concerned about possible stroke.  At arrival to the ER, however, patient does not have any signs of stroke and does not have any complaints that would suggest stroke.  He reports that he fell at 10 PM and had been having pain in the left hip area.  Patient is neurologic baseline is very altered.  He has a history of cerebral palsy as well as paraplegia.  There are no findings to suggest acute stroke.  Patient is without complaints here in the ER including no longer experiencing hip pain.  CT head does not show any intracranial abnormality including no injury from the fall.  Left hip x-ray was negative as well.  As patient is without complaints and is at his normal baseline, no further work-up is necessary. Final Clinical Impression(s) / ED Diagnoses Final diagnoses:  Fall, initial  encounter  Contusion of hip and thigh, left, initial encounter    Rx / DC Orders ED Discharge Orders    None       Cutter Passey, Gwenyth Allegra, MD 04/21/19 [phone]

## 2019-04-21 NOTE — ED Notes (Signed)
EMS in to transport to [place]

## 2019-04-25 ENCOUNTER — Emergency Department (HOSPITAL_COMMUNITY): Payer: Medicare Other

## 2019-04-25 ENCOUNTER — Encounter (HOSPITAL_COMMUNITY): Payer: Self-pay

## 2019-04-25 ENCOUNTER — Emergency Department (HOSPITAL_COMMUNITY)
Admission: EM | Admit: 2019-04-25 | Discharge: 2019-04-25 | Disposition: A | Discharge status: Skilled Nursing Facility | Payer: Medicare Other | Source: Home / Self Care | Attending: Emergency Medicine | Admitting: Emergency Medicine

## 2019-04-25 ENCOUNTER — Other Ambulatory Visit: Payer: Self-pay

## 2019-04-25 DIAGNOSIS — R319 Hematuria, unspecified: Secondary | ICD-10-CM | POA: Diagnosis not present

## 2019-04-25 DIAGNOSIS — R410 Disorientation, unspecified: Secondary | ICD-10-CM | POA: Diagnosis not present

## 2019-04-25 DIAGNOSIS — U071 COVID-19: Secondary | ICD-10-CM | POA: Insufficient documentation

## 2019-04-25 DIAGNOSIS — J690 Pneumonitis due to inhalation of food and vomit: Secondary | ICD-10-CM | POA: Diagnosis not present

## 2019-04-25 DIAGNOSIS — N390 Urinary tract infection, site not specified: Secondary | ICD-10-CM | POA: Diagnosis not present

## 2019-04-25 DIAGNOSIS — J9601 Acute respiratory failure with hypoxia: Secondary | ICD-10-CM | POA: Diagnosis not present

## 2019-04-25 DIAGNOSIS — G809 Cerebral palsy, unspecified: Secondary | ICD-10-CM | POA: Insufficient documentation

## 2019-04-25 DIAGNOSIS — E119 Type 2 diabetes mellitus without complications: Secondary | ICD-10-CM | POA: Insufficient documentation

## 2019-04-25 DIAGNOSIS — Z79899 Other long term (current) drug therapy: Secondary | ICD-10-CM | POA: Insufficient documentation

## 2019-04-25 DIAGNOSIS — R4182 Altered mental status, unspecified: Secondary | ICD-10-CM | POA: Diagnosis not present

## 2019-04-25 DIAGNOSIS — I10 Essential (primary) hypertension: Secondary | ICD-10-CM | POA: Insufficient documentation

## 2019-04-25 DIAGNOSIS — J1282 Pneumonia due to coronavirus disease 2019: Secondary | ICD-10-CM | POA: Diagnosis not present

## 2019-04-25 DIAGNOSIS — A4189 Other specified sepsis: Secondary | ICD-10-CM | POA: Diagnosis not present

## 2019-04-25 LAB — URINALYSIS, ROUTINE W REFLEX MICROSCOPIC
Bacteria, UA: NONE SEEN
Bilirubin Urine: NEGATIVE
Glucose, UA: NEGATIVE mg/dL
Ketones, ur: NEGATIVE mg/dL
Leukocytes,Ua: NEGATIVE
Nitrite: NEGATIVE
Protein, ur: >=300 mg/dL — AB
Specific Gravity, Urine: 1.022 (ref 1.005–1.030)
pH: 5 (ref 5.0–8.0)

## 2019-04-25 LAB — CBC WITH DIFFERENTIAL/PLATELET
Abs Immature Granulocytes: 0.01 10*3/uL (ref 0.00–0.07)
Basophils Absolute: 0 10*3/uL (ref 0.0–0.1)
Basophils Relative: 0 %
Eosinophils Absolute: 0 10*3/uL (ref 0.0–0.5)
Eosinophils Relative: 0 %
HCT: 35 % — ABNORMAL LOW (ref 39.0–52.0)
Hemoglobin: 11.4 g/dL — ABNORMAL LOW (ref 13.0–17.0)
Immature Granulocytes: 0 %
Lymphocytes Relative: 34 %
Lymphs Abs: 0.9 10*3/uL (ref 0.7–4.0)
MCH: 29.5 pg (ref 26.0–34.0)
MCHC: 32.6 g/dL (ref 30.0–36.0)
MCV: 90.4 fL (ref 80.0–100.0)
Monocytes Absolute: 0.3 10*3/uL (ref 0.1–1.0)
Monocytes Relative: 9 %
Neutro Abs: 1.5 10*3/uL — ABNORMAL LOW (ref 1.7–7.7)
Neutrophils Relative %: 57 %
Platelets: 131 10*3/uL — ABNORMAL LOW (ref 150–400)
RBC: 3.87 MIL/uL — ABNORMAL LOW (ref 4.22–5.81)
RDW: 12.7 % (ref 11.5–15.5)
WBC: 2.7 10*3/uL — ABNORMAL LOW (ref 4.0–10.5)
nRBC: 0 % (ref 0.0–0.2)

## 2019-04-25 LAB — CBG MONITORING, ED
Glucose-Capillary: 65 mg/dL — ABNORMAL LOW (ref 70–99)
Glucose-Capillary: 96 mg/dL (ref 70–99)

## 2019-04-25 LAB — BASIC METABOLIC PANEL
Anion gap: 12 (ref 5–15)
BUN: 25 mg/dL — ABNORMAL HIGH (ref 8–23)
CO2: 23 mmol/L (ref 22–32)
Calcium: 7.8 mg/dL — ABNORMAL LOW (ref 8.9–10.3)
Chloride: 102 mmol/L (ref 98–111)
Creatinine, Ser: 1.31 mg/dL — ABNORMAL HIGH (ref 0.61–1.24)
GFR calc Af Amer: >60 mL/min (ref >60)
GFR calc non Af Amer: 56 mL/min — ABNORMAL LOW (ref >60)
Glucose, Bld: 69 mg/dL — ABNORMAL LOW (ref 70–99)
Potassium: 3.7 mmol/L (ref 3.5–5.1)
Sodium: 137 mmol/L (ref 135–145)

## 2019-04-25 LAB — POC SARS CORONAVIRUS 2 AG -  ED: SARS Coronavirus 2 Ag: POSITIVE — AB

## 2019-04-25 MED ORDER — ACETAMINOPHEN 325 MG PO TABS
650.0000 mg | ORAL_TABLET | Freq: Once | ORAL | Status: AC
  Administered 2019-04-25: 09:00:00 650 mg via ORAL
  Filled 2019-04-25: qty 2

## 2019-04-25 NOTE — ED Provider Notes (Signed)
[facility] EMERGENCY DEPARTMENT Provider Note   CSN: [id number] Arrival date & time: 04/25/19  [id number]     History Chief Complaint  Patient presents with  . Altered Mental Status    Jeremy Parks is a 68 y.o. male with a history of diabetes, seizure disorder on Tegretol and cerebral palsy presenting from a local nursing home with complaints of "not acting right".  Per nursing home report he is not responding as his normal self, instead has remained with eyes closed and answering questions with short answers only.  He is refusing to open his eyes.  He reports "I just do not feel good".  Patient is not a good history giver.  He responds with "no" answers to specific symptoms including sore throat, headache, chest pain, shortness of breath, abdominal pain, nausea, painful urination.    HPI     Past Medical History:  Diagnosis Date  . Cerebral palsy ([place])   . Diabetes mellitus without complication ([place])   . Gingivitis   . Lipoma of abdominal wall   . Seizures [facility])     Patient Active Problem List   Diagnosis Date Noted  . HTN (hypertension) 10/30/2018  . Hypertensive urgency 10/28/2018  . Cerebral palsy ([place])   . Diabetes mellitus without complication ([place])   . Seizures ([place])     History reviewed. No pertinent surgical history.     Family History  Family history unknown: Yes    Social History   Tobacco Use  . Smoking status: Never Smoker  . Smokeless tobacco: Never Used  Substance Use Topics  . Alcohol use: No  . Drug use: No    Home Medications Prior to Admission medications   Medication Sig Start Date End Date Taking? Authorizing Provider  acetaminophen (TYLENOL) 500 MG tablet Take 500 mg by mouth every 8 (eight) hours.    [provider]  amLODipine (NORVASC) 10 MG tablet Take 1 tablet (10 mg total) by mouth daily. 10/31/18   Sinda Du, MD  atorvastatin (LIPITOR) 40 MG tablet Take 40 mg by mouth at bedtime.     [provider]   carbamazepine (TEGRETOL) 200 MG tablet Take 1.5 tablets (300 mg total) by mouth 2 (two) times daily. 10/31/16   Forde Dandy, MD  cephALEXin (KEFLEX) 500 MG capsule Take 1 capsule by mouth 3 (three) times daily. 01/18/19   [provider]  chlorhexidine (PERIDEX) 0.12 % solution Use as directed 15 mLs in the mouth or throat daily. **Swish 15 mls for 30 seconds then spit out. USE DAILY**    [provider]  docusate sodium (COLACE) 100 MG capsule Take 100 mg by mouth 2 (two) times daily.    [provider]  fluticasone (FLONASE) 50 MCG/ACT nasal spray Place 2 sprays into both nostrils daily.    [provider]  Insulin Glargine (BASAGLAR KWIKPEN [place]) Inject 20 Units into the skin at bedtime.    [provider]  nystatin (MYCOSTATIN/NYSTOP) powder Apply topically 4 (four) times daily. Apply topically to affected area of groin twice daily as needed for rash.    [provider]    Allergies    Patient has no known allergies.  Review of Systems   Review of Systems  Unable to perform ROS: Mental status change    Physical Exam Updated Vital Signs BP 131/77 (BP Location: Right Arm)   Pulse 90   Temp 99.2 F (37.3 C) (Oral)   Resp (!) 21   Ht 5'  6" (1.676 m)   Wt 81.6 kg   SpO2 96%   BMI 29.05 kg/m   Physical Exam Vitals and nursing note reviewed.  Constitutional:      General: He is not in acute distress.    Appearance: He is well-developed. He is not diaphoretic.     Comments: Febrile 101.6 rectal  HENT:     Head: Normocephalic and atraumatic.     Mouth/Throat:     Mouth: Mucous membranes are moist.  Eyes:     Comments: Pt will not open eyes on command and actively resists manual eyelid opening.  Cardiovascular:     Rate and Rhythm: Normal rate and regular rhythm.     Heart sounds: Normal heart sounds.  Pulmonary:     Effort: Pulmonary effort is normal.     Breath sounds: Normal breath sounds. No wheezing.  Abdominal:      General: Bowel sounds are normal.     Palpations: Abdomen is soft.     Tenderness: There is no abdominal tenderness. There is no guarding.  Musculoskeletal:        General: No swelling or deformity. Normal range of motion.     Cervical back: Normal range of motion.     Right lower leg: No edema.     Left lower leg: No edema.  Skin:    General: Skin is warm and dry.     Capillary Refill: Capillary refill takes less than 2 seconds.  Neurological:     Comments: Equal grip strength.     ED Results / Procedures / Treatments   Labs (all labs ordered are listed, but only abnormal results are displayed) Labs Reviewed  CBC WITH DIFFERENTIAL/PLATELET - Abnormal; Notable for the following components:      Result Value   WBC 2.7 (*)    RBC 3.87 (*)    Hemoglobin 11.4 (*)    HCT 35.0 (*)    Platelets 131 (*)    Neutro Abs 1.5 (*)    All other components within normal limits  BASIC METABOLIC PANEL - Abnormal; Notable for the following components:   Glucose, Bld 69 (*)    BUN 25 (*)    Creatinine, Ser 1.31 (*)    Calcium 7.8 (*)    GFR calc non Af Amer 56 (*)    All other components within normal limits  URINALYSIS, ROUTINE W REFLEX MICROSCOPIC - Abnormal; Notable for the following components:   APPearance HAZY (*)    Hgb urine dipstick MODERATE (*)    Protein, ur >=300 (*)    All other components within normal limits  POC SARS CORONAVIRUS 2 AG -  ED - Abnormal; Notable for the following components:   SARS Coronavirus 2 Ag POSITIVE (*)    All other components within normal limits  CBG MONITORING, ED - Abnormal; Notable for the following components:   Glucose-Capillary 65 (*)    All other components within normal limits  CBG MONITORING, ED    EKG None  Radiology DG Chest Portable 1 View  Result Date: 04/25/2019 CLINICAL DATA:  Altered mental status. Fever. EXAM: PORTABLE CHEST 1 VIEW COMPARISON:  One-view chest x-ray 04/12/2019. FINDINGS: The heart size is upper limits of  normal, exaggerated by low lung volumes. Minimal bibasilar airspace disease likely reflects atelectasis. Upper lung fields scratched at there is no edema. No focal airspace disease is present. Upper lung fields are clear. IMPRESSION: 1. Minimal bibasilar airspace disease likely reflects atelectasis. 2. No focal airspace disease.  3. Borderline cardiomegaly without failure. Electronically Signed   By: San Morelle M.D.   On: 04/25/2019 08:52    Procedures Procedures (including critical care time)  Medications Ordered in ED Medications  acetaminophen (TYLENOL) tablet 650 mg (650 mg Oral Given 04/25/19 [id number])    ED Course  I have reviewed the triage vital signs and the nursing notes.  Pertinent labs & imaging results that were available during my care of the patient were reviewed by me and considered in my medical decision making (see chart for details).    MDM Rules/Calculators/A&P                      Patient with COVID-19.  He is a nursing home patient at a facility with an already high rate of COVID-19.  He tolerated p.o. intake while here.  Was given Tylenol with adequate reduction in his fever.  Instructions to continue with Tylenol every 6 hours as needed for fever.  Push fluids.  Early recheck for any worsening symptoms including shortness of breath.  His labs and imaging were reviewed and interpreted.  Covid instructions were also given regarding isolation.  Jeremy Parks was evaluated in Emergency Department on 04/25/2019 for the symptoms described in the history of present illness. He was evaluated in the context of the global COVID-19 pandemic, which necessitated consideration that the patient might be at risk for infection with the SARS-CoV-2 virus that causes COVID-19. Institutional protocols and algorithms that pertain to the evaluation of patients at risk for COVID-19 are in a state of rapid change based on information released by regulatory bodies including the CDC and  federal and state organizations. These policies and algorithms were followed during the patient's care in the ED.  Final Clinical Impression(s) / ED Diagnoses Final diagnoses:  [id number]    Rx / DC Orders ED Discharge Orders    None       Landis Martins 04/25/19 1137    Long, Wonda [place], MD 04/27/19 1351

## 2019-04-25 NOTE — Discharge Instructions (Addendum)
You have Covid 19 infection which can make you feel weak and achy.  Treat your fever with tylenol every 6 hours as needed.  Make sure you are drinking plenty of fluids to avoid dehydration.   You will need to be isolated for the next 10 days, but longer if your symptoms of fever persist.       Person Under Monitoring Name: Jeremy Parks  Location: [facility] [address] [place] [address]   Infection Prevention Recommendations for Individuals Confirmed to have, or Being Evaluated for, 2019 Novel Coronavirus (COVID-19) Infection Who Receive Care at Home  Individuals who are confirmed to have, or are being evaluated for, COVID-19 should follow the prevention steps below until a healthcare provider or local or state health department says they can return to normal activities.  Stay home except to get medical care You should restrict activities outside your home, except for getting medical care. Do not go to work, school, or public areas, and do not use public transportation or taxis.  Call ahead before visiting your doctor Before your medical appointment, call the healthcare provider and tell them that you have, or are being evaluated for, COVID-19 infection. This will help the healthcare provider's office take steps to keep other people from getting infected. Ask your healthcare provider to call the local or state health department.  Monitor your symptoms Seek prompt medical attention if your illness is worsening (e.g., difficulty breathing). Before going to your medical appointment, call the healthcare provider and tell them that you have, or are being evaluated for, COVID-19 infection. Ask your healthcare provider to call the local or state health department.  Wear a facemask You should wear a facemask that covers your nose and mouth when you are in the same room with other people and when you visit a healthcare provider. People who live with or visit you should also  wear a facemask while they are in the same room with you.  Separate yourself from other people in your home As much as possible, you should stay in a different room from other people in your home. Also, you should use a separate bathroom, if available.  Avoid sharing household items You should not share dishes, drinking glasses, cups, eating utensils, towels, bedding, or other items with other people in your home. After using these items, you should wash them thoroughly with soap and water.  Cover your coughs and sneezes Cover your mouth and nose with a tissue when you cough or sneeze, or you can cough or sneeze into your sleeve. Throw used tissues in a lined trash can, and immediately wash your hands with soap and water for at least 20 seconds or use an alcohol-based hand rub.  Wash your [facility] your hands often and thoroughly with soap and water for at least 20 seconds. You can use an alcohol-based hand sanitizer if soap and water are not available and if your hands are not visibly dirty. Avoid touching your eyes, nose, and mouth with unwashed hands.   Prevention Steps for Caregivers and Household Members of Individuals Confirmed to have, or Being Evaluated for, COVID-19 Infection Being Cared for in the Home  If you live with, or provide care at home for, a person confirmed to have, or being evaluated for, COVID-19 infection please follow these guidelines to prevent infection:  Follow healthcare provider's instructions Make sure that you understand and can help the patient follow any healthcare provider instructions for all care.  Provide  for the patient's basic needs You should help the patient with basic needs in the home and provide support for getting groceries, prescriptions, and other personal needs.  Monitor the patient's symptoms If they are getting sicker, call his or her medical provider and tell them that the patient has, or is being evaluated for, COVID-19  infection. This will help the healthcare provider's office take steps to keep other people from getting infected. Ask the healthcare provider to call the local or state health department.  Limit the number of people who have contact with the patient If possible, have only one caregiver for the patient. Other household members should stay in another home or place of residence. If this is not possible, they should stay in another room, or be separated from the patient as much as possible. Use a separate bathroom, if available. Restrict visitors who do not have an essential need to be in the home.  Keep older adults, very young children, and other sick people away from the patient Keep older adults, very young children, and those who have compromised immune systems or chronic health conditions away from the patient. This includes people with chronic heart, lung, or kidney conditions, diabetes, and cancer.  Ensure good ventilation Make sure that shared spaces in the home have good air flow, such as from an air conditioner or an opened window, weather permitting.  Wash your hands often Wash your hands often and thoroughly with soap and water for at least 20 seconds. You can use an alcohol based hand sanitizer if soap and water are not available and if your hands are not visibly dirty. Avoid touching your eyes, nose, and mouth with unwashed hands. Use disposable paper towels to dry your hands. If not available, use dedicated cloth towels and replace them when they become wet.  Wear a facemask and gloves Wear a disposable facemask at all times in the room and gloves when you touch or have contact with the patient's blood, body fluids, and/or secretions or excretions, such as sweat, saliva, sputum, nasal mucus, vomit, urine, or feces.  Ensure the mask fits over your nose and mouth tightly, and do not touch it during use. Throw out disposable facemasks and gloves after using them. Do not reuse. Wash  your hands immediately after removing your facemask and gloves. If your personal clothing becomes contaminated, carefully remove clothing and launder. Wash your hands after handling contaminated clothing. Place all used disposable facemasks, gloves, and other waste in a lined container before disposing them with other household waste. Remove gloves and wash your hands immediately after handling these items.  Do not share dishes, glasses, or other household items with the patient Avoid sharing household items. You should not share dishes, drinking glasses, cups, eating utensils, towels, bedding, or other items with a patient who is confirmed to have, or being evaluated for, COVID-19 infection. After the person uses these items, you should wash them thoroughly with soap and water.  Wash laundry thoroughly Immediately remove and wash clothes or bedding that have blood, body fluids, and/or secretions or excretions, such as sweat, saliva, sputum, nasal mucus, vomit, urine, or feces, on them. Wear gloves when handling laundry from the patient. Read and follow directions on labels of laundry or clothing items and detergent. In general, wash and dry with the warmest temperatures recommended on the label.  Clean all areas the individual has used often Clean all touchable surfaces, such as counters, tabletops, doorknobs, bathroom fixtures, toilets, phones, keyboards, tablets, and  bedside tables, every day. Also, clean any surfaces that may have blood, body fluids, and/or secretions or excretions on them. Wear gloves when cleaning surfaces the patient has come in contact with. Use a diluted bleach solution (e.g., dilute bleach with 1 part bleach and 10 parts water) or a household disinfectant with a label that says EPA-registered for coronaviruses. To make a bleach solution at home, add 1 tablespoon of bleach to 1 quart (4 cups) of water. For a larger supply, add  cup of bleach to 1 gallon (16 cups) of  water. Read labels of cleaning products and follow recommendations provided on product labels. Labels contain instructions for safe and effective use of the cleaning product including precautions you should take when applying the product, such as wearing gloves or eye protection and making sure you have good ventilation during use of the product. Remove gloves and wash hands immediately after cleaning.  Monitor yourself for signs and symptoms of illness Caregivers and household members are considered close contacts, should monitor their health, and will be asked to limit movement outside of the home to the extent possible. Follow the monitoring steps for close contacts listed on the symptom monitoring form.   ? If you have additional questions, contact your local health department or call the epidemiologist on call at [phone] (available 24/7). ? This guidance is subject to change. For the most up-to-date guidance from [facility], please refer to their website: [email or web address]

## 2019-04-25 NOTE — ED Triage Notes (Signed)
Pt sent from [facility] due to "not acting right". EMS reports pt covid negative. Pt not responding as normal. Pt with eyes closed but will answer short questions  CBG 84.Yesterday was his usual self

## 2019-04-26 ENCOUNTER — Emergency Department (HOSPITAL_COMMUNITY): Payer: Medicare Other

## 2019-04-26 ENCOUNTER — Other Ambulatory Visit: Payer: Self-pay

## 2019-04-26 ENCOUNTER — Encounter (HOSPITAL_COMMUNITY): Payer: Self-pay | Admitting: Emergency Medicine

## 2019-04-26 ENCOUNTER — Inpatient Hospital Stay (HOSPITAL_COMMUNITY)
Admission: EM | Admit: 2019-04-26 | Discharge: 2019-05-09 | DRG: 177 | Disposition: A | Discharge status: Skilled Nursing Facility | Payer: Medicare Other | Attending: Internal Medicine | Admitting: Internal Medicine

## 2019-04-26 DIAGNOSIS — Z6827 Body mass index (BMI) 27.0-27.9, adult: Secondary | ICD-10-CM

## 2019-04-26 DIAGNOSIS — E785 Hyperlipidemia, unspecified: Secondary | ICD-10-CM | POA: Diagnosis present

## 2019-04-26 DIAGNOSIS — E876 Hypokalemia: Secondary | ICD-10-CM | POA: Diagnosis present

## 2019-04-26 DIAGNOSIS — R319 Hematuria, unspecified: Secondary | ICD-10-CM

## 2019-04-26 DIAGNOSIS — N390 Urinary tract infection, site not specified: Secondary | ICD-10-CM

## 2019-04-26 DIAGNOSIS — J690 Pneumonitis due to inhalation of food and vomit: Secondary | ICD-10-CM | POA: Diagnosis present

## 2019-04-26 DIAGNOSIS — Z79899 Other long term (current) drug therapy: Secondary | ICD-10-CM

## 2019-04-26 DIAGNOSIS — B964 Proteus (mirabilis) (morganii) as the cause of diseases classified elsewhere: Secondary | ICD-10-CM | POA: Diagnosis present

## 2019-04-26 DIAGNOSIS — D649 Anemia, unspecified: Secondary | ICD-10-CM | POA: Diagnosis present

## 2019-04-26 DIAGNOSIS — U071 COVID-19: Secondary | ICD-10-CM | POA: Diagnosis not present

## 2019-04-26 DIAGNOSIS — Z515 Encounter for palliative care: Secondary | ICD-10-CM

## 2019-04-26 DIAGNOSIS — R0902 Hypoxemia: Secondary | ICD-10-CM

## 2019-04-26 DIAGNOSIS — A4189 Other specified sepsis: Secondary | ICD-10-CM | POA: Diagnosis not present

## 2019-04-26 DIAGNOSIS — Z794 Long term (current) use of insulin: Secondary | ICD-10-CM

## 2019-04-26 DIAGNOSIS — E119 Type 2 diabetes mellitus without complications: Secondary | ICD-10-CM

## 2019-04-26 DIAGNOSIS — E870 Hyperosmolality and hypernatremia: Secondary | ICD-10-CM | POA: Diagnosis not present

## 2019-04-26 DIAGNOSIS — G809 Cerebral palsy, unspecified: Secondary | ICD-10-CM | POA: Diagnosis present

## 2019-04-26 DIAGNOSIS — R410 Disorientation, unspecified: Secondary | ICD-10-CM | POA: Diagnosis not present

## 2019-04-26 DIAGNOSIS — E663 Overweight: Secondary | ICD-10-CM | POA: Diagnosis present

## 2019-04-26 DIAGNOSIS — E1165 Type 2 diabetes mellitus with hyperglycemia: Secondary | ICD-10-CM | POA: Diagnosis present

## 2019-04-26 DIAGNOSIS — R739 Hyperglycemia, unspecified: Secondary | ICD-10-CM | POA: Diagnosis present

## 2019-04-26 DIAGNOSIS — R7401 Elevation of levels of liver transaminase levels: Secondary | ICD-10-CM | POA: Diagnosis present

## 2019-04-26 DIAGNOSIS — D638 Anemia in other chronic diseases classified elsewhere: Secondary | ICD-10-CM | POA: Diagnosis present

## 2019-04-26 DIAGNOSIS — I10 Essential (primary) hypertension: Secondary | ICD-10-CM | POA: Diagnosis present

## 2019-04-26 DIAGNOSIS — G40909 Epilepsy, unspecified, not intractable, without status epilepticus: Secondary | ICD-10-CM | POA: Diagnosis present

## 2019-04-26 DIAGNOSIS — Z7189 Other specified counseling: Secondary | ICD-10-CM

## 2019-04-26 DIAGNOSIS — J1282 Pneumonia due to coronavirus disease 2019: Secondary | ICD-10-CM | POA: Diagnosis present

## 2019-04-26 DIAGNOSIS — R4182 Altered mental status, unspecified: Secondary | ICD-10-CM | POA: Diagnosis not present

## 2019-04-26 DIAGNOSIS — J9601 Acute respiratory failure with hypoxia: Secondary | ICD-10-CM | POA: Diagnosis present

## 2019-04-26 DIAGNOSIS — R338 Other retention of urine: Secondary | ICD-10-CM | POA: Diagnosis present

## 2019-04-26 DIAGNOSIS — Z95828 Presence of other vascular implants and grafts: Secondary | ICD-10-CM

## 2019-04-26 DIAGNOSIS — N179 Acute kidney failure, unspecified: Secondary | ICD-10-CM | POA: Diagnosis present

## 2019-04-26 DIAGNOSIS — R569 Unspecified convulsions: Secondary | ICD-10-CM

## 2019-04-26 DIAGNOSIS — R404 Transient alteration of awareness: Secondary | ICD-10-CM | POA: Diagnosis not present

## 2019-04-26 LAB — BASIC METABOLIC PANEL
Anion gap: 13 (ref 5–15)
BUN: 29 mg/dL — ABNORMAL HIGH (ref 8–23)
CO2: 21 mmol/L — ABNORMAL LOW (ref 22–32)
Calcium: 7.9 mg/dL — ABNORMAL LOW (ref 8.9–10.3)
Chloride: 104 mmol/L (ref 98–111)
Creatinine, Ser: 1.36 mg/dL — ABNORMAL HIGH (ref 0.61–1.24)
GFR calc Af Amer: >60 mL/min (ref >60)
GFR calc non Af Amer: 53 mL/min — ABNORMAL LOW (ref >60)
Glucose, Bld: 183 mg/dL — ABNORMAL HIGH (ref 70–99)
Potassium: 4 mmol/L (ref 3.5–5.1)
Sodium: 138 mmol/L (ref 135–145)

## 2019-04-26 LAB — CBC WITH DIFFERENTIAL/PLATELET
Abs Immature Granulocytes: 0.01 10*3/uL (ref 0.00–0.07)
Basophils Absolute: 0 10*3/uL (ref 0.0–0.1)
Basophils Relative: 0 %
Eosinophils Absolute: 0 10*3/uL (ref 0.0–0.5)
Eosinophils Relative: 0 %
HCT: 36.1 % — ABNORMAL LOW (ref 39.0–52.0)
Hemoglobin: 11.4 g/dL — ABNORMAL LOW (ref 13.0–17.0)
Immature Granulocytes: 0 %
Lymphocytes Relative: 24 %
Lymphs Abs: 1.3 10*3/uL (ref 0.7–4.0)
MCH: 29.2 pg (ref 26.0–34.0)
MCHC: 31.6 g/dL (ref 30.0–36.0)
MCV: 92.6 fL (ref 80.0–100.0)
Monocytes Absolute: 0.5 10*3/uL (ref 0.1–1.0)
Monocytes Relative: 9 %
Neutro Abs: 3.6 10*3/uL (ref 1.7–7.7)
Neutrophils Relative %: 67 %
Platelets: 163 10*3/uL (ref 150–400)
RBC: 3.9 MIL/uL — ABNORMAL LOW (ref 4.22–5.81)
RDW: 12.8 % (ref 11.5–15.5)
WBC: 5.5 10*3/uL (ref 4.0–10.5)
nRBC: 0 % (ref 0.0–0.2)

## 2019-04-26 LAB — CBG MONITORING, ED
Glucose-Capillary: 63 mg/dL — ABNORMAL LOW (ref 70–99)
Glucose-Capillary: 138 mg/dL — ABNORMAL HIGH (ref 70–99)

## 2019-04-26 MED ORDER — SODIUM CHLORIDE 0.9 % IV BOLUS
1000.0000 mL | Freq: Once | INTRAVENOUS | Status: AC
  Administered 2019-04-27: 1000 mL via INTRAVENOUS

## 2019-04-26 MED ORDER — DEXTROSE 50 % IV SOLN
1.0000 | Freq: Once | INTRAVENOUS | Status: AC
  Administered 2019-04-26: 50 mL via INTRAVENOUS
  Filled 2019-04-26: qty 50

## 2019-04-26 NOTE — ED Triage Notes (Signed)
Pt from [facility] called out for AMS. Ems stated hes usually up talking and hes just sleeping today

## 2019-04-26 NOTE — ED Provider Notes (Signed)
[facility] EMERGENCY DEPARTMENT Provider Note   CSN: [id number] Arrival date & time: 04/26/19  1907     History Chief Complaint  Patient presents with  . Altered Mental Status    Jeremy Parks is a 68 y.o. male.  HPI      Jeremy Parks is a 68 y.o. male with past medical history of cerebral palsy, diabetes, and seizures who resides at a local nursing home [facility]) who was sent to the emergency department for evaluation of altered mental status and lethargy.  Patient was seen here yesterday and he is positive for COVID-19.  Per nursing home staff, patient is usually very talkative but today he has just mostly been sleeping.  Will respond to direct commands by nodding or shaking his head yes or no, but not wanting to open his eyes.  Presentation is similar to evaluation yesterday per provider note.  No reported fever, vomiting, diarrhea, dysuria.  Patient denies shortness of breath or pain.  Past Medical History:  Diagnosis Date  . Cerebral palsy ([place])   . Diabetes mellitus without complication ([place])   . Gingivitis   . Lipoma of abdominal wall   . Seizures [facility])     Patient Active Problem List   Diagnosis Date Noted  . HTN (hypertension) 10/30/2018  . Hypertensive urgency 10/28/2018  . Cerebral palsy ([place])   . Diabetes mellitus without complication ([place])   . Seizures ([place])     History reviewed. No pertinent surgical history.     Family History  Family history unknown: Yes    Social History   Tobacco Use  . Smoking status: Never Smoker  . Smokeless tobacco: Never Used  Substance Use Topics  . Alcohol use: No  . Drug use: No    Home Medications Prior to Admission medications   Medication Sig Start Date End Date Taking? Authorizing Provider  acetaminophen (TYLENOL) 500 MG tablet Take 500 mg by mouth every 8 (eight) hours.   Yes [provider]  amLODipine (NORVASC) 10 MG tablet Take 1 tablet (10 mg total) by mouth daily. 10/31/18  Yes Sinda Du, MD  atorvastatin (LIPITOR) 40 MG tablet Take 40 mg by mouth at bedtime.    Yes [provider]  carbamazepine (TEGRETOL) 200 MG tablet Take 1.5 tablets (300 mg total) by mouth 2 (two) times daily. 10/31/16  Yes Forde Dandy, MD  chlorhexidine (PERIDEX) 0.12 % solution Use as directed 15 mLs in the mouth or throat daily. **Swish 15 mls for 30 seconds then spit out. USE DAILY**   Yes [provider]  docusate sodium (COLACE) 100 MG capsule Take 100 mg by mouth 2 (two) times daily.   Yes [provider]  fluticasone (FLONASE) 50 MCG/ACT nasal spray Place 2 sprays into both nostrils daily.   Yes [provider]  Insulin Glargine (BASAGLAR KWIKPEN [facility] [place]) Inject 20 Units into the skin at bedtime.   Yes [provider]  nystatin (MYCOSTATIN/NYSTOP) powder Apply topically 4 (four) times daily. Apply topically to affected area of groin twice daily as needed for rash.   Yes [provider]    Allergies    Patient has no known allergies.  Review of Systems   Review of Systems  Unable to perform ROS: Mental status change    Physical Exam Updated Vital Signs BP (!) 122/58 (BP Location: Right Arm)   Pulse 95   Temp 98.2 F (36.8 C) (Oral)   Resp 20   Ht 5'  8" (1.727 m)   Wt 82 kg   SpO2 96%   BMI 27.49 kg/m   Physical Exam Constitutional:      Appearance: He is not ill-appearing or toxic-appearing.  HENT:     Head: Normocephalic.     Mouth/Throat:     Mouth: Mucous membranes are moist.  Eyes:     Conjunctiva/sclera: Conjunctivae normal.     Comments: Patient will not open eyes on command, but resists manual opening of his eyes.  Neck:     Thyroid: No thyromegaly.     Meningeal: Kernig's sign absent.  Cardiovascular:     Rate and Rhythm: Normal rate and regular rhythm.     Pulses: Normal pulses.  Pulmonary:     Effort: Pulmonary effort is normal. No respiratory distress.     Breath sounds: Normal breath sounds. No wheezing.    Chest:     Chest wall: No tenderness.  Abdominal:     General: There is no distension.     Palpations: Abdomen is soft.     Tenderness: There is no abdominal tenderness. There is no guarding or rebound.  Musculoskeletal:        General: Normal range of motion.     Cervical back: Normal range of motion and neck supple.     Right lower leg: No edema.     Left lower leg: No edema.  Lymphadenopathy:     Cervical: No cervical adenopathy.  Skin:    General: Skin is warm and dry.     Capillary Refill: Capillary refill takes less than 2 seconds.     Findings: No rash.  Neurological:     Sensory: Sensation is intact.     Motor: No weakness.     Comments: Patient grimaces to painful stimuli.  Grip strength is strong and symmetrical bilaterally.  Response to direct commands by nodding or shaking his head yes or no     ED Results / Procedures / Treatments   Labs (all labs ordered are listed, but only abnormal results are displayed) Labs Reviewed  BASIC METABOLIC PANEL - Abnormal; Notable for the following components:      Result Value   CO2 21 (*)    Glucose, Bld 183 (*)    BUN 29 (*)    Creatinine, Ser 1.36 (*)    Calcium 7.9 (*)    GFR calc non Af Amer 53 (*)    All other components within normal limits  CBC WITH DIFFERENTIAL/PLATELET - Abnormal; Notable for the following components:   RBC 3.90 (*)    Hemoglobin 11.4 (*)    HCT 36.1 (*)    All other components within normal limits  URINALYSIS, ROUTINE W REFLEX MICROSCOPIC - Abnormal; Notable for the following components:   Color, Urine AMBER (*)    APPearance CLOUDY (*)    Hgb urine dipstick SMALL (*)    Ketones, ur 20 (*)    Protein, ur >=300 (*)    Leukocytes,Ua SMALL (*)    Bacteria, UA FEW (*)    All other components within normal limits  CBG MONITORING, ED - Abnormal; Notable for the following components:   Glucose-Capillary 63 (*)    All other components within normal limits  CBG MONITORING, ED - Abnormal; Notable  for the following components:   Glucose-Capillary 138 (*)    All other components within normal limits  URINE CULTURE  LACTIC ACID, PLASMA  LACTIC ACID, PLASMA    EKG None  Radiology DG Chest  Portable 1 View  Result Date: 04/25/2019 CLINICAL DATA:  Altered mental status. Fever. EXAM: PORTABLE CHEST 1 VIEW COMPARISON:  One-view chest x-ray 04/12/2019. FINDINGS: The heart size is upper limits of normal, exaggerated by low lung volumes. Minimal bibasilar airspace disease likely reflects atelectasis. Upper lung fields scratched at there is no edema. No focal airspace disease is present. Upper lung fields are clear. IMPRESSION: 1. Minimal bibasilar airspace disease likely reflects atelectasis. 2. No focal airspace disease. 3. Borderline cardiomegaly without failure. Electronically Signed   By: San Morelle M.D.   On: 04/25/2019 08:52    Procedures Procedures (including critical care time)  Medications Ordered in ED Medications  cefTRIAXone (ROCEPHIN) 1 g in sodium chloride 0.9 % 100 mL IVPB (has no administration in time range)  dexamethasone (DECADRON) injection 6 mg (has no administration in time range)  dextrose 50 % solution 50 mL (50 mLs Intravenous Given 04/26/19 2119)  sodium chloride 0.9 % bolus 1,000 mL (1,000 mLs Intravenous New Bag/Given 04/27/19 0101)  acetaminophen (TYLENOL) suppository 650 mg (650 mg Rectal Given 04/27/19 0101)    ED Course  I have reviewed the triage vital signs and the nursing notes.  Pertinent labs & imaging results that were available during my care of the patient were reviewed by me and considered in my medical decision making (see chart for details).    MDM Rules/Calculators/A&P                      Patient sent here from [facility] for evaluation of lethargy.  Patient is nontoxic-appearing.  Patient will not open his eyes on command, but responds to direct questions by shaking or nodding his head.  He was seen here yesterday for similar  symptoms and providers notes indicate similar exam findings yesterday.  Patient is COVID-19 positive.  Oral temp documented at 98.2, but noted to have rectal temp of 101.  he has a history of seizures so CT head was ordered tonight.  No acute findings.  He does not appear postictal.  Patient also seen by Dr. Stark Jock and care plan discussed.  Urinalysis shows possible infection.  Patient symptoms possibly related to Covid infection versus urinary infection.  No tachycardia or hypoxia.  Will give Rocephin here, urine culture pending.  Will consult hospitalist for admission.    0200 consulted hospitalist, Dr. Orie Rout who agrees to admit.  Requests repeat CXR for comparison and IV decadron  LIRIDON BOHLEN was evaluated in Emergency Department on 04/27/2019 for the symptoms described in the history of present illness. He was evaluated in the context of the global COVID-19 pandemic, which necessitated consideration that the patient might be at risk for infection with the SARS-CoV-2 virus that causes COVID-19. Institutional protocols and algorithms that pertain to the evaluation of patients at risk for COVID-19 are in a state of rapid change based on information released by regulatory bodies including the CDC and federal and state organizations. These policies and algorithms were followed during the patient's care in the ED.  Final Clinical Impression(s) / ED Diagnoses Final diagnoses:  COVID-19 virus infection  Urinary tract infection with hematuria, site unspecified    Rx / DC Orders ED Discharge Orders    None       Kem Parkinson, PA-C 04/27/19 Mervyn Gay, MD 04/27/19 [phone]

## 2019-04-27 ENCOUNTER — Emergency Department (HOSPITAL_COMMUNITY): Payer: Medicare Other

## 2019-04-27 ENCOUNTER — Other Ambulatory Visit: Payer: Self-pay

## 2019-04-27 DIAGNOSIS — D649 Anemia, unspecified: Secondary | ICD-10-CM | POA: Diagnosis not present

## 2019-04-27 DIAGNOSIS — M6281 Muscle weakness (generalized): Secondary | ICD-10-CM | POA: Diagnosis not present

## 2019-04-27 DIAGNOSIS — M2550 Pain in unspecified joint: Secondary | ICD-10-CM | POA: Diagnosis not present

## 2019-04-27 DIAGNOSIS — J690 Pneumonitis due to inhalation of food and vomit: Secondary | ICD-10-CM | POA: Diagnosis not present

## 2019-04-27 DIAGNOSIS — R402411 Glasgow coma scale score 13-15, in the field [EMT or ambulance]: Secondary | ICD-10-CM | POA: Diagnosis not present

## 2019-04-27 DIAGNOSIS — J9601 Acute respiratory failure with hypoxia: Secondary | ICD-10-CM | POA: Diagnosis present

## 2019-04-27 DIAGNOSIS — R338 Other retention of urine: Secondary | ICD-10-CM | POA: Diagnosis not present

## 2019-04-27 DIAGNOSIS — J1289 Other viral pneumonia: Secondary | ICD-10-CM

## 2019-04-27 DIAGNOSIS — R41841 Cognitive communication deficit: Secondary | ICD-10-CM | POA: Diagnosis not present

## 2019-04-27 DIAGNOSIS — U071 COVID-19: Secondary | ICD-10-CM | POA: Diagnosis present

## 2019-04-27 DIAGNOSIS — J1282 Pneumonia due to coronavirus disease 2019: Secondary | ICD-10-CM | POA: Diagnosis present

## 2019-04-27 DIAGNOSIS — N179 Acute kidney failure, unspecified: Secondary | ICD-10-CM | POA: Diagnosis present

## 2019-04-27 DIAGNOSIS — Z79899 Other long term (current) drug therapy: Secondary | ICD-10-CM | POA: Diagnosis not present

## 2019-04-27 DIAGNOSIS — R262 Difficulty in walking, not elsewhere classified: Secondary | ICD-10-CM | POA: Diagnosis not present

## 2019-04-27 DIAGNOSIS — R4182 Altered mental status, unspecified: Secondary | ICD-10-CM | POA: Diagnosis not present

## 2019-04-27 DIAGNOSIS — R739 Hyperglycemia, unspecified: Secondary | ICD-10-CM | POA: Diagnosis present

## 2019-04-27 DIAGNOSIS — E785 Hyperlipidemia, unspecified: Secondary | ICD-10-CM | POA: Diagnosis present

## 2019-04-27 DIAGNOSIS — Z515 Encounter for palliative care: Secondary | ICD-10-CM | POA: Diagnosis not present

## 2019-04-27 DIAGNOSIS — E119 Type 2 diabetes mellitus without complications: Secondary | ICD-10-CM | POA: Diagnosis not present

## 2019-04-27 DIAGNOSIS — Z7401 Bed confinement status: Secondary | ICD-10-CM | POA: Diagnosis not present

## 2019-04-27 DIAGNOSIS — J1281 Pneumonia due to SARS-associated coronavirus: Secondary | ICD-10-CM | POA: Diagnosis not present

## 2019-04-27 DIAGNOSIS — R1312 Dysphagia, oropharyngeal phase: Secondary | ICD-10-CM | POA: Diagnosis not present

## 2019-04-27 DIAGNOSIS — Z6827 Body mass index (BMI) 27.0-27.9, adult: Secondary | ICD-10-CM | POA: Diagnosis not present

## 2019-04-27 DIAGNOSIS — B964 Proteus (mirabilis) (morganii) as the cause of diseases classified elsewhere: Secondary | ICD-10-CM | POA: Diagnosis present

## 2019-04-27 DIAGNOSIS — A4189 Other specified sepsis: Secondary | ICD-10-CM | POA: Diagnosis not present

## 2019-04-27 DIAGNOSIS — E663 Overweight: Secondary | ICD-10-CM | POA: Diagnosis present

## 2019-04-27 DIAGNOSIS — I10 Essential (primary) hypertension: Secondary | ICD-10-CM | POA: Diagnosis present

## 2019-04-27 DIAGNOSIS — Z7189 Other specified counseling: Secondary | ICD-10-CM | POA: Diagnosis not present

## 2019-04-27 DIAGNOSIS — E1165 Type 2 diabetes mellitus with hyperglycemia: Secondary | ICD-10-CM | POA: Diagnosis present

## 2019-04-27 DIAGNOSIS — R7401 Elevation of levels of liver transaminase levels: Secondary | ICD-10-CM | POA: Diagnosis present

## 2019-04-27 DIAGNOSIS — G809 Cerebral palsy, unspecified: Secondary | ICD-10-CM | POA: Diagnosis present

## 2019-04-27 DIAGNOSIS — N390 Urinary tract infection, site not specified: Secondary | ICD-10-CM | POA: Diagnosis present

## 2019-04-27 DIAGNOSIS — D638 Anemia in other chronic diseases classified elsewhere: Secondary | ICD-10-CM | POA: Diagnosis present

## 2019-04-27 DIAGNOSIS — R569 Unspecified convulsions: Secondary | ICD-10-CM | POA: Diagnosis not present

## 2019-04-27 DIAGNOSIS — J9600 Acute respiratory failure, unspecified whether with hypoxia or hypercapnia: Secondary | ICD-10-CM | POA: Diagnosis not present

## 2019-04-27 DIAGNOSIS — Z794 Long term (current) use of insulin: Secondary | ICD-10-CM | POA: Diagnosis not present

## 2019-04-27 DIAGNOSIS — E870 Hyperosmolality and hypernatremia: Secondary | ICD-10-CM | POA: Diagnosis present

## 2019-04-27 DIAGNOSIS — A419 Sepsis, unspecified organism: Secondary | ICD-10-CM | POA: Diagnosis not present

## 2019-04-27 DIAGNOSIS — G40909 Epilepsy, unspecified, not intractable, without status epilepticus: Secondary | ICD-10-CM | POA: Diagnosis present

## 2019-04-27 DIAGNOSIS — E876 Hypokalemia: Secondary | ICD-10-CM | POA: Diagnosis present

## 2019-04-27 LAB — URINALYSIS, ROUTINE W REFLEX MICROSCOPIC
Bilirubin Urine: NEGATIVE
Glucose, UA: NEGATIVE mg/dL
Ketones, ur: 20 mg/dL — AB
Nitrite: NEGATIVE
Protein, ur: >=300 mg/dL — AB
Specific Gravity, Urine: 1.02 (ref 1.005–1.030)
pH: 8 (ref 5.0–8.0)

## 2019-04-27 LAB — MAGNESIUM: Magnesium: 2.3 mg/dL (ref 1.7–2.4)

## 2019-04-27 LAB — COMPREHENSIVE METABOLIC PANEL
ALT: 52 U/L — ABNORMAL HIGH (ref 0–44)
AST: 155 U/L — ABNORMAL HIGH (ref 15–41)
Albumin: 3.2 g/dL — ABNORMAL LOW (ref 3.5–5.0)
Alkaline Phosphatase: 74 U/L (ref 38–126)
Anion gap: 11 (ref 5–15)
BUN: 28 mg/dL — ABNORMAL HIGH (ref 8–23)
CO2: 23 mmol/L (ref 22–32)
Calcium: 7.9 mg/dL — ABNORMAL LOW (ref 8.9–10.3)
Chloride: 108 mmol/L (ref 98–111)
Creatinine, Ser: 1.24 mg/dL (ref 0.61–1.24)
GFR calc Af Amer: >60 mL/min (ref >60)
GFR calc non Af Amer: 60 mL/min — ABNORMAL LOW (ref >60)
Glucose, Bld: 91 mg/dL (ref 70–99)
Potassium: 4 mmol/L (ref 3.5–5.1)
Sodium: 142 mmol/L (ref 135–145)
Total Bilirubin: 0.7 mg/dL (ref 0.3–1.2)
Total Protein: 6.7 g/dL (ref 6.5–8.1)

## 2019-04-27 LAB — CBC WITH DIFFERENTIAL/PLATELET
Abs Immature Granulocytes: 0.02 10*3/uL (ref 0.00–0.07)
Basophils Absolute: 0 10*3/uL (ref 0.0–0.1)
Basophils Relative: 0 %
Eosinophils Absolute: 0 10*3/uL (ref 0.0–0.5)
Eosinophils Relative: 0 %
HCT: 35.2 % — ABNORMAL LOW (ref 39.0–52.0)
Hemoglobin: 11.2 g/dL — ABNORMAL LOW (ref 13.0–17.0)
Immature Granulocytes: 0 %
Lymphocytes Relative: 11 %
Lymphs Abs: 0.6 10*3/uL — ABNORMAL LOW (ref 0.7–4.0)
MCH: 29.1 pg (ref 26.0–34.0)
MCHC: 31.8 g/dL (ref 30.0–36.0)
MCV: 91.4 fL (ref 80.0–100.0)
Monocytes Absolute: 0.4 10*3/uL (ref 0.1–1.0)
Monocytes Relative: 6 %
Neutro Abs: 4.7 10*3/uL (ref 1.7–7.7)
Neutrophils Relative %: 83 %
Platelets: 180 10*3/uL (ref 150–400)
RBC: 3.85 MIL/uL — ABNORMAL LOW (ref 4.22–5.81)
RDW: 13 % (ref 11.5–15.5)
WBC: 5.6 10*3/uL (ref 4.0–10.5)
nRBC: 0 % (ref 0.0–0.2)

## 2019-04-27 LAB — C-REACTIVE PROTEIN: CRP: 19.6 mg/dL — ABNORMAL HIGH (ref <1.0)

## 2019-04-27 LAB — CBG MONITORING, ED: Glucose-Capillary: 94 mg/dL (ref 70–99)

## 2019-04-27 LAB — D-DIMER, QUANTITATIVE: D-Dimer, Quant: 3.56 ug/mL-FEU — ABNORMAL HIGH (ref 0.00–0.50)

## 2019-04-27 LAB — HEMOGLOBIN A1C
Hgb A1c MFr Bld: 5.3 % (ref 4.8–5.6)
Mean Plasma Glucose: 105.41 mg/dL

## 2019-04-27 LAB — FERRITIN: Ferritin: 307 ng/mL (ref 24–336)

## 2019-04-27 LAB — ABO/RH: ABO/RH(D): A POS

## 2019-04-27 LAB — LACTIC ACID, PLASMA: Lactic Acid, Venous: 1.5 mmol/L (ref 0.5–1.9)

## 2019-04-27 LAB — GLUCOSE, CAPILLARY
Glucose-Capillary: 128 mg/dL — ABNORMAL HIGH (ref 70–99)
Glucose-Capillary: 112 mg/dL — ABNORMAL HIGH (ref 70–99)

## 2019-04-27 LAB — PHOSPHORUS: Phosphorus: 2.1 mg/dL — ABNORMAL LOW (ref 2.5–4.6)

## 2019-04-27 MED ORDER — SODIUM CHLORIDE 0.9 % IV SOLN
1.0000 g | INTRAVENOUS | Status: DC
  Administered 2019-04-27: 1 g via INTRAVENOUS
  Filled 2019-04-27: qty 10

## 2019-04-27 MED ORDER — DOCUSATE SODIUM 100 MG PO CAPS
100.0000 mg | ORAL_CAPSULE | Freq: Two times a day (BID) | ORAL | Status: DC
  Administered 2019-05-02 – 2019-05-09 (×7): 100 mg via ORAL
  Filled 2019-04-27 (×10): qty 1

## 2019-04-27 MED ORDER — DEXAMETHASONE SODIUM PHOSPHATE 10 MG/ML IJ SOLN
6.0000 mg | Freq: Every day | INTRAMUSCULAR | Status: DC
  Administered 2019-04-28 – 2019-05-06 (×8): 6 mg via INTRAVENOUS
  Filled 2019-04-27 (×8): qty 1

## 2019-04-27 MED ORDER — CARBAMAZEPINE 200 MG PO TABS
300.0000 mg | ORAL_TABLET | Freq: Two times a day (BID) | ORAL | Status: DC
  Filled 2019-04-27 (×3): qty 1.5
  Filled 2019-04-27: qty 2

## 2019-04-27 MED ORDER — ASCORBIC ACID 500 MG PO TABS
500.0000 mg | ORAL_TABLET | Freq: Every day | ORAL | Status: DC
  Administered 2019-05-02: 500 mg via ORAL
  Filled 2019-04-27 (×4): qty 1

## 2019-04-27 MED ORDER — DEXAMETHASONE SODIUM PHOSPHATE 10 MG/ML IJ SOLN
6.0000 mg | Freq: Once | INTRAMUSCULAR | Status: AC
  Administered 2019-04-27: 6 mg via INTRAVENOUS
  Filled 2019-04-27: qty 1

## 2019-04-27 MED ORDER — AMLODIPINE BESYLATE 10 MG PO TABS
10.0000 mg | ORAL_TABLET | Freq: Every day | ORAL | Status: DC
  Filled 2019-04-27: qty 2

## 2019-04-27 MED ORDER — LEVETIRACETAM IN NACL 500 MG/100ML IV SOLN
500.0000 mg | Freq: Two times a day (BID) | INTRAVENOUS | Status: DC
  Administered 2019-04-27 – 2019-05-01 (×7): 500 mg via INTRAVENOUS
  Filled 2019-04-27 (×9): qty 100

## 2019-04-27 MED ORDER — INSULIN ASPART 100 UNIT/ML ~~LOC~~ SOLN
0.0000 [IU] | Freq: Every day | SUBCUTANEOUS | Status: DC
  Administered 2019-05-05: 2 [IU] via SUBCUTANEOUS

## 2019-04-27 MED ORDER — ALBUTEROL SULFATE HFA 108 (90 BASE) MCG/ACT IN AERS
2.0000 | INHALATION_SPRAY | Freq: Four times a day (QID) | RESPIRATORY_TRACT | Status: DC
  Administered 2019-04-27 – 2019-05-01 (×12): 2 via RESPIRATORY_TRACT
  Filled 2019-04-27 (×2): qty 6.7

## 2019-04-27 MED ORDER — ENOXAPARIN SODIUM 40 MG/0.4ML ~~LOC~~ SOLN
40.0000 mg | Freq: Every day | SUBCUTANEOUS | Status: DC
  Administered 2019-04-27 – 2019-04-28 (×2): 40 mg via SUBCUTANEOUS
  Filled 2019-04-27 (×2): qty 0.4

## 2019-04-27 MED ORDER — SODIUM CHLORIDE 0.9 % IV SOLN
100.0000 mg | Freq: Every day | INTRAVENOUS | Status: AC
  Administered 2019-04-28 – 2019-05-01 (×4): 100 mg via INTRAVENOUS
  Filled 2019-04-27 (×8): qty 20

## 2019-04-27 MED ORDER — GUAIFENESIN-DM 100-10 MG/5ML PO SYRP: 10.0000 mL | ORAL_SOLUTION | ORAL | Status: DC | PRN

## 2019-04-27 MED ORDER — ACETAMINOPHEN 650 MG RE SUPP
650.0000 mg | Freq: Once | RECTAL | Status: AC
  Administered 2019-04-27: 650 mg via RECTAL
  Filled 2019-04-27: qty 1

## 2019-04-27 MED ORDER — ONDANSETRON HCL 4 MG/2ML IJ SOLN
4.0000 mg | Freq: Four times a day (QID) | INTRAMUSCULAR | Status: DC | PRN
  Administered 2019-04-27 – 2019-05-03 (×3): 4 mg via INTRAVENOUS
  Filled 2019-04-27 (×3): qty 2

## 2019-04-27 MED ORDER — HYDROCOD POLST-CPM POLST ER 10-8 MG/5ML PO SUER: 5.0000 mL | Freq: Two times a day (BID) | ORAL | Status: DC | PRN

## 2019-04-27 MED ORDER — SODIUM CHLORIDE 0.9 % IV SOLN
1.0000 g | Freq: Once | INTRAVENOUS | Status: AC
  Administered 2019-04-27: 1 g via INTRAVENOUS
  Filled 2019-04-27: qty 10

## 2019-04-27 MED ORDER — METOPROLOL TARTRATE 5 MG/5ML IV SOLN
5.0000 mg | Freq: Four times a day (QID) | INTRAVENOUS | Status: DC
  Administered 2019-04-27 – 2019-04-28 (×3): 5 mg via INTRAVENOUS
  Filled 2019-04-27 (×3): qty 5

## 2019-04-27 MED ORDER — ZINC SULFATE 220 (50 ZN) MG PO CAPS
220.0000 mg | ORAL_CAPSULE | Freq: Every day | ORAL | Status: DC
  Administered 2019-05-02: 220 mg via ORAL
  Filled 2019-04-27 (×3): qty 1

## 2019-04-27 MED ORDER — ATORVASTATIN CALCIUM 40 MG PO TABS
40.0000 mg | ORAL_TABLET | Freq: Every day | ORAL | Status: DC
  Filled 2019-04-27: qty 1

## 2019-04-27 MED ORDER — SODIUM CHLORIDE 0.9 % IV SOLN
200.0000 mg | Freq: Once | INTRAVENOUS | Status: AC
  Administered 2019-04-27: 200 mg via INTRAVENOUS
  Filled 2019-04-27: qty 40

## 2019-04-27 MED ORDER — DM-GUAIFENESIN ER 30-600 MG PO TB12
1.0000 | ORAL_TABLET | Freq: Two times a day (BID) | ORAL | Status: DC
  Administered 2019-05-02 – 2019-05-06 (×2): 1 via ORAL
  Filled 2019-04-27 (×7): qty 1

## 2019-04-27 MED ORDER — ACETAMINOPHEN 325 MG PO TABS: 650.0000 mg | ORAL_TABLET | Freq: Once | ORAL | Status: DC

## 2019-04-27 MED ORDER — INSULIN ASPART 100 UNIT/ML ~~LOC~~ SOLN
0.0000 [IU] | Freq: Three times a day (TID) | SUBCUTANEOUS | Status: DC
  Administered 2019-04-27: 1 [IU] via SUBCUTANEOUS
  Administered 2019-04-30 (×2): 3 [IU] via SUBCUTANEOUS
  Administered 2019-04-30: 1 [IU] via SUBCUTANEOUS
  Administered 2019-05-01 – 2019-05-02 (×6): 2 [IU] via SUBCUTANEOUS
  Administered 2019-05-03 – 2019-05-04 (×4): 1 [IU] via SUBCUTANEOUS
  Administered 2019-05-04: 2 [IU] via SUBCUTANEOUS
  Administered 2019-05-05: 5 [IU] via SUBCUTANEOUS
  Administered 2019-05-05 – 2019-05-06 (×3): 3 [IU] via SUBCUTANEOUS
  Administered 2019-05-06 – 2019-05-09 (×5): 2 [IU] via SUBCUTANEOUS
  Administered 2019-05-09: 1 [IU] via SUBCUTANEOUS

## 2019-04-27 NOTE — ED Notes (Signed)
Remdesivir given to [employer] [facility] for transport.

## 2019-04-27 NOTE — H&P (Signed)
History and Physical  Jeremy Parks [id number] DOB: [birth year] DOA: 04/26/2019  Referring physician: Kem Parkinson PA-C PCP: Sinda Du, MD  Patient coming from: [facility]  Chief Complaint: Altered mental status  HPI: Jeremy Parks is a 68 y.o. male resident of a local nursing home ([place]) with medical history significant for type 2 diabetes mellitus, seizures and cerebral palsy was sent to the emergency department due to altered mental status and patient being lethargic.  Patient was usually communicative and able to engage in some conversation at baseline.  However, he was noted to be sleeping more and less talkative yesterday, he tested positive for Covid 19 on 12/22.  Patient was still able to respond to questions with nodding/shaking his head without opening his eyes in the ED there was no report of chest pain, fever, chills, vomiting, shortness of breath or diarrhea.  ED Course:  In the emergency department, he was noted to be febrile with a temperature of 101.68F, but other vital signs were within normal range.  Work-up in the ED showed normal CBC except for normocytic anemia and hyperglycemia.  Lactic acid was negative.  Urinalysis was negative.  CT of head without contrast showed no acute findings.  Chest x-ray showed patchy airspace opacity at the left lung base suspected to be due to atelectasis and/or early infectious etiology.  Tylenol was given due to fever, patient was initially started on IV ceftriaxone due to presumed UTI.  IV Decadron 6 mg was given and IV hydration with 1 L of NS was also given.  Hospitalist was asked to admit patient for further evaluation and management.  Review of Systems: This cannot be obtained at this time due to patient's current altered mental status.   Past Medical History:  Diagnosis Date  . Cerebral palsy ([place])   . Diabetes mellitus without complication ([place])   . Gingivitis   . Lipoma of abdominal wall   . Seizures  ([place])    History reviewed. No pertinent surgical history.  Social History:  reports that he has never smoked. He has never used smokeless tobacco. He reports that he does not drink alcohol or use drugs.   No Known Allergies  Family History  Family history unknown: Yes     Prior to Admission medications   Medication Sig Start Date End Date Taking? Authorizing Provider  acetaminophen (TYLENOL) 500 MG tablet Take 500 mg by mouth every 8 (eight) hours.   Yes [provider]  amLODipine (NORVASC) 10 MG tablet Take 1 tablet (10 mg total) by mouth daily. 10/31/18  Yes Sinda Du, MD  atorvastatin (LIPITOR) 40 MG tablet Take 40 mg by mouth at bedtime.    Yes [provider]  carbamazepine (TEGRETOL) 200 MG tablet Take 1.5 tablets (300 mg total) by mouth 2 (two) times daily. 10/31/16  Yes Forde Dandy, MD  chlorhexidine (PERIDEX) 0.12 % solution Use as directed 15 mLs in the mouth or throat daily. **Swish 15 mls for 30 seconds then spit out. USE DAILY**   Yes [provider]  docusate sodium (COLACE) 100 MG capsule Take 100 mg by mouth 2 (two) times daily.   Yes [provider]  fluticasone (FLONASE) 50 MCG/ACT nasal spray Place 2 sprays into both nostrils daily.   Yes [provider]  Insulin Glargine (BASAGLAR KWIKPEN [place]) Inject 20 Units into the skin at bedtime.   Yes [provider]  nystatin (MYCOSTATIN/NYSTOP) powder Apply topically 4 (four) times daily. Apply  topically to affected area of groin twice daily as needed for rash.   Yes [provider]    Physical Exam: BP 138/68   Pulse 89   Temp (!) 100.8 F (38.2 C) (Rectal)   Resp 18   Ht 5\' 8"  (1.727 m)   Wt 82 kg   SpO2 98%   BMI 27.49 kg/m   . General: 68 y.o. year-old male well developed well nourished in no acute distress.  Alert and oriented x3. Marland Kitchen HEENT: Cephalic, atraumatic . NECK: Supple, trachea medial . Cardiovascular: Regular rate and rhythm with no  rubs or gallops.  No thyromegaly or JVD noted.  No lower extremity edema. 2/4 pulses in all 4 extremities. Marland Kitchen Respiratory: Clear to auscultation with no wheezes or rales. Good inspiratory effort. . Abdomen: Soft nontender nondistended with normal bowel sounds x4 quadrants. . Muskuloskeletal: No cyanosis, clubbing or edema noted bilaterally . Neuro: Patient withdraws to painful stimuli.  Responds to commands with nodding/shaking of head.  Grip strength 5/5 bilaterally.   . Skin: No ulcerative lesions noted or rashes . Psychiatry: Judgement and insight appear normal. Mood is appropriate for condition and setting          Labs on Admission:  Basic Metabolic Panel: Recent Labs  Lab 04/25/19 0948 04/26/19 2132  NA 137 138  K 3.7 4.0  CL 102 104  CO2 23 21*  GLUCOSE 69* 183*  BUN 25* 29*  CREATININE 1.31* 1.36*  CALCIUM 7.8* 7.9*   Liver Function Tests: No results for input(s): AST, ALT, ALKPHOS, BILITOT, PROT, ALBUMIN in the last 168 hours. No results for input(s): LIPASE, AMYLASE in the last 168 hours. No results for input(s): AMMONIA in the last 168 hours. CBC: Recent Labs  Lab 04/25/19 0948 04/26/19 2132  WBC 2.7* 5.5  NEUTROABS 1.5* 3.6  HGB 11.4* 11.4*  HCT 35.0* 36.1*  MCV 90.4 92.6  PLT 131* 163   Cardiac Enzymes: No results for input(s): CKTOTAL, CKMB, CKMBINDEX, TROPONINI in the last 168 hours.  BNP (last 3 results) No results for input(s): BNP in the last 8760 hours.  ProBNP (last 3 results) No results for input(s): PROBNP in the last 8760 hours.  CBG: Recent Labs  Lab 04/21/19 0055 04/25/19 1045 04/25/19 1116 04/26/19 2028 04/26/19 2235  GLUCAP 88 65* 96 63* 138*    Radiological Exams on Admission: CT Head Wo Contrast  Result Date: 04/26/2019 CLINICAL DATA:  Change in mental status EXAM: CT HEAD WITHOUT CONTRAST TECHNIQUE: Contiguous axial images were obtained from the base of the skull through the vertex without intravenous contrast. COMPARISON:   April 21, 2019 FINDINGS: Brain: No evidence of acute territorial infarction, hemorrhage, hydrocephalus,extra-axial collection or mass lesion/mass effect. There is dilatation the ventricles and sulci consistent with age-related atrophy. Low-attenuation changes in the deep white matter consistent with small vessel ischemia. Vascular: No hyperdense vessel or unexpected calcification. Skull: The skull is intact. No fracture or focal lesion identified. Sinuses/Orbits: The visualized paranasal sinuses and mastoid air cells are clear. The orbits and globes intact. Other: None IMPRESSION: No acute intracranial abnormality. Findings consistent with age related atrophy and chronic small vessel ischemia Electronically Signed   By: Prudencio Pair M.D.   On: 04/26/2019 22:37   DG Chest Portable 1 View  Result Date: 04/27/2019 CLINICAL DATA:  Lethargy EXAM: PORTABLE CHEST 1 VIEW COMPARISON:  April 25, 2019 FINDINGS: The heart size and mediastinal contours are w unchanged. There is prominence to the central pulmonary vasculature. Mildly increased patchy  airspace opacity at the left lung base. No acute osseous abnormality. IMPRESSION: Pulmonary vascular congestion. Patchy airspace opacity at the left lung base which could be due to atelectasis and/or early infectious etiology. Electronically Signed   By: Prudencio Pair M.D.   On: 04/27/2019 03:19   DG Chest Portable 1 View  Result Date: 04/25/2019 CLINICAL DATA:  Altered mental status. Fever. EXAM: PORTABLE CHEST 1 VIEW COMPARISON:  One-view chest x-ray 04/12/2019. FINDINGS: The heart size is upper limits of normal, exaggerated by low lung volumes. Minimal bibasilar airspace disease likely reflects atelectasis. Upper lung fields scratched at there is no edema. No focal airspace disease is present. Upper lung fields are clear. IMPRESSION: 1. Minimal bibasilar airspace disease likely reflects atelectasis. 2. No focal airspace disease. 3. Borderline cardiomegaly without  failure. Electronically Signed   By: San Morelle M.D.   On: 04/25/2019 08:52    EKG: I independently viewed the EKG done and my findings are as followed: EKG was not done in the ED  Assessment/Plan Present on Admission: . Pneumonia due to COVID-19 virus . Cerebral palsy ([place]) . HTN (hypertension)  Principal Problem:   Pneumonia due to COVID-19 virus Active Problems:   Cerebral palsy ([place])   Diabetes mellitus without complication (H[facility])   Seizures ([place])   HTN (hypertension)   Normocytic anemia   Hyperglycemia   Hyperlipidemia  Pneumonia due to COVID-19 virus POC SARS coronavirus 2 Ag done on 04/25/2019 was positive Continue albuterol Q6H Continue IV Decadron 6 mg daily Remdesivir per pharmacy protocol Continue vit. C 500 mg and Zn 220 mg daily Continue mucolytics and antitussives Continue incentive spirometry Continue to obtain daily COVID inflammatory markers and adjust accordingly Continue supplemental oxygen via [place] to obtain SPO2 > 93%.  Hyperglycemia possibly secondary to type 2 diabetes mellitus Continue insulin sliding scale and hypoglycemia protocol  Essential hypertension Continue home meds when med rec is updated  Hyperlipidemia Continue home meds when med rec is updated  Seizures Continue home meds when med rec is updated  History of cerebral palsy Stable; continue fall precaution and neuro checks  DVT prophylaxis: Lovenox  Code Status: Full code  Family Communication: None at bedside  Disposition Plan: Patient will be discharged back to nursing facility once clinically improved  Consults called: None  Admission status: Inpatient    Bernadette Hoit MD [facility]  If 7PM-7AM, please contact night-coverage [email or web address] 04/27/2019, 4:24 AM

## 2019-04-27 NOTE — ED Notes (Signed)
Pt was given breakfast tray. Pt states he does not want it.

## 2019-04-27 NOTE — Plan of Care (Signed)
Went over [place] with pt. Pt drowsy will go over again when more alert.

## 2019-04-27 NOTE — Progress Notes (Addendum)
Per HPI: Jeremy Parks is a 68 y.o. male resident of a local nursing home ([facility]) with medical history significant for type 2 diabetes mellitus, seizures and cerebral palsy was sent to the emergency department due to altered mental status and patient being lethargic.  Patient was usually communicative and able to engage in some conversation at baseline.  However, he was noted to be sleeping more and less talkative yesterday, he tested positive for Covid 19 on 12/22.  Patient was still able to respond to questions with nodding/shaking his head without opening his eyes in the ED there was no report of chest pain, fever, chills, vomiting, shortness of breath or diarrhea.  12/24: Patient seen and evaluated this morning.  He was admitted with acute hypoxemic respiratory failure secondary to COVID-19 pneumonia and was started on IV Decadron.  He also has history of cerebral palsy as well as seizures and hypertension and dyslipidemia.  He is quite somnolent and is currently on 2 L nasal cannula oxygen with no acute respiratory distress noted.  He has been started on dexamethasone and we will plan to start remdesivir now.  CRP is noted to be elevated at 19.6 along with other inflammatory markers.  Will possibly need to consider initiation of Actemra as appropriate. We will continue on Rocephin for now for possible UTI and follow urine culture. Continue to monitor repeat inflammatory markers on a daily basis.  Plan to transfer to  [facility] for further treatment.  Total care time: 20 minutes.

## 2019-04-27 NOTE — Progress Notes (Addendum)
PROGRESS NOTE  Jeremy Parks [id number] DOB: 02/25/1951 DOA: 04/26/2019 PCP: Sinda Du, MD  HPI/Recap of past 11 hours: 68 year old male with past medical history of diabetes mellitus, seizure disorder, cerebral palsy and hypertension who resides in a local nursing home who had been seen in the emergency room on 12/18 for slurred speech which had resolved by the time the patient was in the emergency room itself.  He was sent back with no other findings.  However, he has since been to the emergency room several days for confusion or somnolence.  On 12/22 he had a Covid test which came back positive.  He came in on the night of 12/23 for increased somnolence in the emergency room, was noted to have a temperature of 101.3.  Chest x-ray noted evidence of patchy opacity at his left lung base and given his change in mentation plus positive Covid test, it was felt best that he come in to the hospitalist service.  He did develop some mild hypoxia requiring 2 L nasal cannula.  He had a questionable UTI and was given a dose of IV Rocephin.  Started on IV Decadron and Remdisivir on early morning of 12/24.  Transferred to [facility].  Following arrival to [facility], had episodes of vomiting.  Resolved with some Zofran.  Patient still quite somnolent.  Assessment/Plan: Principal Problem:   Acute hypoxemic respiratory failure due to COVID-19 [facility]): Continue supplemental oxygen.  Will give 1 dose of Actemra given elevated CRP and hypoxia.  Continue remdesivir and steroids.  Will follow closely. Active Problems:   Cerebral palsy ([place])   Diabetes mellitus without complication ([place]): Following CBGs, especially while on steroids    Seizures ([place]): Since taking PO seems to be in question, will change to IV Keppra  Overweight: Patient meets criteria for BMI greater than 25    Essential hypertension: IV lopressor until able to take PO    Normocytic anemia: Stable    Hyperlipidemia:  Continue statin.  Patient has no evidence of UTI.  Nausea/Vomiting: Check swallow eval.  Ileus?    AKI (acute kidney injury) [facility]): Initially present on admission, resolved with fluids.   Code Status: Full code  Family Communication: No family listed.  Called case worker-currently out of office for holiday   Disposition Plan: Return back to nursing home once hypoxia resolved, more awake   Consultants:  None  Procedures:  None   Antimicrobials:  IV Remdisivir 12/24-present   IV Rocephin x1 dose 12/23  DVT prophylaxis:  Lovenox   Objective: Vitals:   04/27/19 1200 04/27/19 1209  BP: (!) 171/80   Pulse: 85 90  Resp: 18 18  Temp: 98.9 F (37.2 C)   SpO2: 100% 100%   No intake or output data in the 24 hours ending 04/27/19 1408 Filed Weights   04/26/19 1925  Weight: 82 kg   Body mass index is 27.49 kg/m.  Exam:   General:  Somnolent, no acute distress (was vomiting earlier)  HEENT: Normocephalic, atraumatic, mucous membranes slightly dry   Neck: Supple, no JVD  Cardiovascular: Regular rhythm, borderline tachycardia  Respiratory: Clear to auscultation bilaterally  Abdomen: Soft, nondistended, hypoactive bowel sounds  Musculoskeletal: No clubbing or cyanosis or edema  Psychiatry: Somnolent, no evidence of psychoses   Data Reviewed: CBC: Recent Labs  Lab 04/25/19 0948 04/26/19 2132 04/27/19 0609  WBC 2.7* 5.5 5.6  NEUTROABS 1.5* 3.6 4.7  HGB 11.4* 11.4* 11.2*  HCT 35.0* 36.1* 35.2*  MCV 90.4 92.6  91.4  PLT 131* 163 [id number]   Basic Metabolic Panel: Recent Labs  Lab 04/25/19 0948 04/26/19 2132 04/27/19 0609  NA 137 138 142  K 3.7 4.0 4.0  CL 102 104 108  CO2 23 21* 23  GLUCOSE 69* 183* 91  BUN 25* 29* 28*  CREATININE 1.31* 1.36* 1.24  CALCIUM 7.8* 7.9* 7.9*  MG  --   --  2.3  PHOS  --   --  2.1*   GFR: Estimated Creatinine Clearance: 55.9 mL/min (by C-G formula based on SCr of 1.24 mg/dL). Liver Function Tests: Recent Labs    Lab 04/27/19 0609  AST 155*  ALT 52*  ALKPHOS 74  BILITOT 0.7  PROT 6.7  ALBUMIN 3.2*   No results for input(s): LIPASE, AMYLASE in the last 168 hours. No results for input(s): AMMONIA in the last 168 hours. Coagulation Profile: No results for input(s): INR, PROTIME in the last 168 hours. Cardiac Enzymes: No results for input(s): CKTOTAL, CKMB, CKMBINDEX, TROPONINI in the last 168 hours. BNP (last 3 results) No results for input(s): PROBNP in the last 8760 hours. HbA1C: No results for input(s): HGBA1C in the last 72 hours. CBG: Recent Labs  Lab 04/25/19 1045 04/25/19 1116 04/26/19 2028 04/26/19 2235 04/27/19 0841  GLUCAP 65* 96 63* 138* 94   Lipid Profile: No results for input(s): CHOL, HDL, LDLCALC, TRIG, CHOLHDL, LDLDIRECT in the last 72 hours. Thyroid Function Tests: No results for input(s): TSH, T4TOTAL, FREET4, T3FREE, THYROIDAB in the last 72 hours. Anemia Panel: Recent Labs    04/27/19 0609  FERRITIN 307   Urine analysis:    Component Value Date/Time   COLORURINE AMBER (A) 04/27/2019 0102   APPEARANCEUR CLOUDY (A) 04/27/2019 0102   LABSPEC 1.020 04/27/2019 0102   PHURINE 8.0 04/27/2019 0102   GLUCOSEU NEGATIVE 04/27/2019 0102   HGBUR SMALL (A) 04/27/2019 0102   BILIRUBINUR NEGATIVE 04/27/2019 0102   KETONESUR 20 (A) 04/27/2019 0102   PROTEINUR >=300 (A) 04/27/2019 0102   UROBILINOGEN 0.2 07/25/2014 1828   NITRITE NEGATIVE 04/27/2019 0102   LEUKOCYTESUR SMALL (A) 04/27/2019 0102   Sepsis Labs: @LABRCNTIP (procalcitonin:4,lacticidven:4)  )No results found for this or any previous visit (from the past 240 hour(s)).    Studies: CT Head Wo Contrast  Result Date: 04/26/2019 CLINICAL DATA:  Change in mental status EXAM: CT HEAD WITHOUT CONTRAST TECHNIQUE: Contiguous axial images were obtained from the base of the skull through the vertex without intravenous contrast. COMPARISON:  April 21, 2019 FINDINGS: Brain: No evidence of acute territorial  infarction, hemorrhage, hydrocephalus,extra-axial collection or mass lesion/mass effect. There is dilatation the ventricles and sulci consistent with age-related atrophy. Low-attenuation changes in the deep white matter consistent with small vessel ischemia. Vascular: No hyperdense vessel or unexpected calcification. Skull: The skull is intact. No fracture or focal lesion identified. Sinuses/Orbits: The visualized paranasal sinuses and mastoid air cells are clear. The orbits and globes intact. Other: None IMPRESSION: No acute intracranial abnormality. Findings consistent with age related atrophy and chronic small vessel ischemia Electronically Signed   By: Prudencio Pair M.D.   On: 04/26/2019 22:37   DG Chest Portable 1 View  Result Date: 04/27/2019 CLINICAL DATA:  Lethargy EXAM: PORTABLE CHEST 1 VIEW COMPARISON:  April 25, 2019 FINDINGS: The heart size and mediastinal contours are w unchanged. There is prominence to the central pulmonary vasculature. Mildly increased patchy airspace opacity at the left lung base. No acute osseous abnormality. IMPRESSION: Pulmonary vascular congestion. Patchy airspace opacity at the left lung base  which could be due to atelectasis and/or early infectious etiology. Electronically Signed   By: Prudencio Pair M.D.   On: 04/27/2019 03:19    Scheduled Meds: . albuterol  2 puff Inhalation Q6H  . amLODipine  10 mg Oral Daily  . vitamin C  500 mg Oral Daily  . atorvastatin  40 mg Oral QHS  . carbamazepine  300 mg Oral BID  . dexamethasone (DECADRON) injection  6 mg Intravenous Q0600  . dextromethorphan-guaiFENesin  1 tablet Oral BID  . docusate sodium  100 mg Oral BID  . enoxaparin (LOVENOX) injection  40 mg Subcutaneous Q0600  . insulin aspart  0-5 Units Subcutaneous QHS  . insulin aspart  0-9 Units Subcutaneous TID WC  . zinc sulfate  220 mg Oral Daily    Continuous Infusions: . cefTRIAXone (ROCEPHIN)  IV 1 g (04/27/19 0927)  . [START ON 04/28/2019] remdesivir 100  mg in NS 100 mL       LOS: 0 days     Annita Brod, MD [facility]  To reach me or the doctor on call, go to: [email or web address] Password [email or web address]  04/27/2019, 2:08 PM

## 2019-04-28 DIAGNOSIS — R338 Other retention of urine: Secondary | ICD-10-CM

## 2019-04-28 LAB — GLUCOSE, CAPILLARY
Glucose-Capillary: 79 mg/dL (ref 70–99)
Glucose-Capillary: 93 mg/dL (ref 70–99)
Glucose-Capillary: 113 mg/dL — ABNORMAL HIGH (ref 70–99)
Glucose-Capillary: 133 mg/dL — ABNORMAL HIGH (ref 70–99)

## 2019-04-28 LAB — CBC WITH DIFFERENTIAL/PLATELET
Abs Immature Granulocytes: 0.04 10*3/uL (ref 0.00–0.07)
Basophils Absolute: 0 10*3/uL (ref 0.0–0.1)
Basophils Relative: 0 %
Eosinophils Absolute: 0 10*3/uL (ref 0.0–0.5)
Eosinophils Relative: 0 %
HCT: 42.1 % (ref 39.0–52.0)
Hemoglobin: 12.9 g/dL — ABNORMAL LOW (ref 13.0–17.0)
Immature Granulocytes: 1 %
Lymphocytes Relative: 8 %
Lymphs Abs: 0.6 10*3/uL — ABNORMAL LOW (ref 0.7–4.0)
MCH: 28.8 pg (ref 26.0–34.0)
MCHC: 30.6 g/dL (ref 30.0–36.0)
MCV: 94 fL (ref 80.0–100.0)
Monocytes Absolute: 0.4 10*3/uL (ref 0.1–1.0)
Monocytes Relative: 5 %
Neutro Abs: 7 10*3/uL (ref 1.7–7.7)
Neutrophils Relative %: 86 %
Platelets: 203 10*3/uL (ref 150–400)
RBC: 4.48 MIL/uL (ref 4.22–5.81)
RDW: 12.9 % (ref 11.5–15.5)
WBC: 8.1 10*3/uL (ref 4.0–10.5)
nRBC: 0 % (ref 0.0–0.2)

## 2019-04-28 LAB — COMPREHENSIVE METABOLIC PANEL
ALT: 58 U/L — ABNORMAL HIGH (ref 0–44)
AST: 161 U/L — ABNORMAL HIGH (ref 15–41)
Albumin: 3.4 g/dL — ABNORMAL LOW (ref 3.5–5.0)
Alkaline Phosphatase: 69 U/L (ref 38–126)
Anion gap: 9 (ref 5–15)
BUN: 27 mg/dL — ABNORMAL HIGH (ref 8–23)
CO2: 23 mmol/L (ref 22–32)
Calcium: 8.1 mg/dL — ABNORMAL LOW (ref 8.9–10.3)
Chloride: 112 mmol/L — ABNORMAL HIGH (ref 98–111)
Creatinine, Ser: 1.38 mg/dL — ABNORMAL HIGH (ref 0.61–1.24)
GFR calc Af Amer: >60 mL/min (ref >60)
GFR calc non Af Amer: 53 mL/min — ABNORMAL LOW (ref >60)
Glucose, Bld: 80 mg/dL (ref 70–99)
Potassium: 3.7 mmol/L (ref 3.5–5.1)
Sodium: 144 mmol/L (ref 135–145)
Total Bilirubin: 0.9 mg/dL (ref 0.3–1.2)
Total Protein: 7.4 g/dL (ref 6.5–8.1)

## 2019-04-28 LAB — PHOSPHORUS: Phosphorus: 1.5 mg/dL — ABNORMAL LOW (ref 2.5–4.6)

## 2019-04-28 LAB — PROCALCITONIN: Procalcitonin: 5.22 ng/mL

## 2019-04-28 LAB — BRAIN NATRIURETIC PEPTIDE: B Natriuretic Peptide: 69.2 pg/mL (ref 0.0–100.0)

## 2019-04-28 LAB — D-DIMER, QUANTITATIVE: D-Dimer, Quant: 7.44 ug/mL-FEU — ABNORMAL HIGH (ref 0.00–0.50)

## 2019-04-28 LAB — MAGNESIUM: Magnesium: 2.2 mg/dL (ref 1.7–2.4)

## 2019-04-28 LAB — C-REACTIVE PROTEIN: CRP: 19.5 mg/dL — ABNORMAL HIGH (ref <1.0)

## 2019-04-28 LAB — FERRITIN: Ferritin: 347 ng/mL — ABNORMAL HIGH (ref 24–336)

## 2019-04-28 MED ORDER — SODIUM PHOSPHATES 45 MMOLE/15ML IV SOLN
20.0000 mmol | Freq: Once | INTRAVENOUS | Status: AC
  Administered 2019-04-28: 20 mmol via INTRAVENOUS
  Filled 2019-04-28: qty 6.67

## 2019-04-28 MED ORDER — TOCILIZUMAB 400 MG/20ML IV SOLN
600.0000 mg | Freq: Once | INTRAVENOUS | Status: AC
  Administered 2019-04-28: 600 mg via INTRAVENOUS
  Filled 2019-04-28: qty 20

## 2019-04-28 MED ORDER — ACETAMINOPHEN 650 MG RE SUPP
650.0000 mg | RECTAL | Status: DC | PRN
  Administered 2019-04-28 – 2019-04-30 (×6): 650 mg via RECTAL
  Filled 2019-04-28 (×7): qty 1

## 2019-04-28 MED ORDER — ENOXAPARIN SODIUM 40 MG/0.4ML ~~LOC~~ SOLN
40.0000 mg | Freq: Two times a day (BID) | SUBCUTANEOUS | Status: DC
  Administered 2019-04-28 – 2019-05-02 (×8): 40 mg via SUBCUTANEOUS
  Filled 2019-04-28 (×8): qty 0.4

## 2019-04-28 MED ORDER — SODIUM CHLORIDE 0.9 % IV SOLN
2.0000 g | Freq: Two times a day (BID) | INTRAVENOUS | Status: DC
  Administered 2019-04-28: 2 g via INTRAVENOUS
  Filled 2019-04-28 (×2): qty 2

## 2019-04-28 NOTE — Progress Notes (Addendum)
Dr. Vanita Ingles paged, patients phosphorus level 1.5 compared to 2.1 yesterday and his axillary temp 100.7. Patient needs IV/rectal medications due to his inability to follow commands and he also will not open his mouth to take medications or to get an oral temperature.   0400: Spoke with Dr. Vanita Ingles who stated that he will order the patient IV phosphorus and tylenol, he also stated that with certain medications they can not be ordered IV so we will just have to try to administer them.

## 2019-04-28 NOTE — Progress Notes (Signed)
  Pharmacy Antibiotic Note  Jeremy Parks is a 68 y.o. male admitted on 04/26/2019 with COVID and possible bacterial PNA .  Pharmacy has been consulted for cefepime and tocilizumab dosing.  Patient clinically worsening after 1 day of antitbiotics and remdesevir. Tmax 102.4. WBC wnl but uptrended.Was on RA now on  2 L O2   CRP 19.6>19.5, ALT 58; PCT 5.22, D-Dimer > 5. Scr 1.38 from BL < 1  CXR: Patchy airspace opacity at the left lung base which could be due to atelectasis and/or early infectious etiology  Plan: Tocilizumab 600mg  x1 Cefepime 2g Q12 hr F/u clinical status, cultures, LOT F/u renal fx, LFTs  Height: 5\' 8"  (172.7 cm) Weight: 180 lb 12.4 oz (82 kg) IBW/kg (Calculated) : 68.4  Temp (24hrs), Avg:100.5 F (38.1 C), Min:97.8 F (36.6 C), Max:104.2 F (40.1 C)  Recent Labs  Lab 04/25/19 0948 04/26/19 2132 04/27/19 0609 04/28/19 0015  WBC 2.7* 5.5 5.6 8.1  CREATININE 1.31* 1.36* 1.24 1.38*  LATICACIDVEN  --  1.5  --   --     Estimated Creatinine Clearance: 50.3 mL/min (A) (by C-G formula based on SCr of 1.38 mg/dL (H)).    No Known Allergies  Antimicrobials this admission: Cefepime 12/25 >> Tocilizumab 12/25  Remdesivir 12/24 >>12/28 Rocephin 12/24  Microbiology results: 12/24 BCx: pend  12/24 UCx: pend    Thank you for allowing pharmacy to be a part of this patient's care.  Benetta Spar, PharmD, BCPS, BCCP Clinical Pharmacist  Please check AMION for all [place] phone numbers After 10:00 PM, call [place] [phone]

## 2019-04-28 NOTE — Plan of Care (Signed)
Patient came be tachypneic at times, Dr. Vanita Ingles made aware his current respirations are 25.

## 2019-04-28 NOTE — Plan of Care (Signed)
Went over [place] with pt. Pt drowsy and not answering orientation questions at this time.

## 2019-04-28 NOTE — Plan of Care (Signed)
Patient is currently on 2L of oxygen via nasal cannula. His blood pressure and heart rate is stable at the time. He received a acetaminophen suppository this morning due to his low grade fever, doctor aware. Currently his phosphorus is being replaced via IV due to a level of 1.5 compared to 2.1 yesterday, MD aware. Patient also does not communicate nor open his mouth to take medications.; Dr. Vanita Ingles made aware.

## 2019-04-28 NOTE — Progress Notes (Signed)
PROGRESS NOTE  HEATHER ZERON [id number] DOB: 1950/10/29 DOA: 04/26/2019 PCP: Sinda Du, MD  HPI/Recap of past 45 hours: 68 year old male with past medical history of diabetes mellitus, seizure disorder, cerebral palsy and hypertension who resides in a local nursing home who had been seen in the emergency room on 12/18 for slurred speech which had resolved by the time the patient was in the emergency room itself.  He was sent back with no other findings.  However, he has since been to the emergency room several days for confusion or somnolence.  On 12/22 he had a Covid test which came back positive.  He came in on the night of 12/23 for increased somnolence in the emergency room, was noted to have a temperature of 101.3.  Chest x-ray noted evidence of patchy opacity at his left lung base and given his change in mentation plus positive Covid test, it was felt best that he come in to the hospitalist service.  He did develop some mild hypoxia requiring 2 L nasal cannula.  He had a questionable UTI and was given a dose of IV Rocephin.  Started on IV Decadron and Remdisivir on early morning of 12/24.  Transferred to [facility].  Following arrival to [facility], had episodes of vomiting.  Resolved with some Zofran.  This morning, still less than responsive although more tachycardic, increased respiratory rate and with elevated CRP, decision made to give Actemra.  Following that, patient settled down, still quite somnolent now. Nursing reports no urination despite fair amount of urine seen in bladder.  Catheterized.  Assessment/Plan: Principal Problem:   Acute hypoxemic respiratory failure due to COVID-19 [facility]): Continue supplemental oxygen.  Will give 1 dose of Actemra given elevated CRP and hypoxia.  Continue remdesivir and steroids.  Will follow closely. Active Problems:   Cerebral palsy ([place])   Diabetes mellitus without complication ([place]): Following CBGs, especially while on  steroids    Seizures ([place]): Since taking PO seems to be in question, will change to IV Keppra  Overweight: Patient meets criteria for BMI greater than 25    Essential hypertension: IV lopressor until able to take PO    Normocytic anemia: Stable    Hyperlipidemia: Continue statin.  Patient has no evidence of UTI.  Nausea/Vomiting: Check swallow eval. will have to wait until tomorrow given limited staffing on holiday.   AKI (acute kidney injury) [facility]): Initially present on admission, resolved with fluids.  Urinary retention: Have started a foley catheter   Code Status: Full code  Family Communication: No family listed.  Called case worker-currently out of office for holiday   Disposition Plan: Return back to nursing home once hypoxia resolved, more awake   Consultants:  None  Procedures:  None   Antimicrobials:  IV Remdisivir 12/24-present   IV Rocephin x1 dose 12/23  IV Actemra x1 dose 12/25  DVT prophylaxis:  Lovenox   Objective: Vitals:   04/28/19 1300 04/28/19 1500  BP: 120/62 (!) 105/57  Pulse: 76 72  Resp: 16 17  Temp:    SpO2: 100% 100%    Intake/Output Summary (Last 24 hours) at 04/28/2019 1611 Last data filed at 04/28/2019 1500 Gross per 24 hour  Intake 200 ml  Output 610 ml  Net -410 ml   Filed Weights   04/26/19 1925  Weight: 82 kg   Body mass index is 27.49 kg/m.  Exam:   General:  Somnolent, agitated earlier  HEENT: Normocephalic, atraumatic, mucous membranes slightly dry  Neck: Supple, no JVD  Cardiovascular: Regular rhythm, borderline tachycardia  Respiratory: Clear to auscultation bilaterally  Abdomen: Soft, nondistended, hypoactive bowel sounds  Musculoskeletal: No clubbing or cyanosis or edema  Psychiatry: Somnolent, no evidence of psychoses   Data Reviewed: CBC: Recent Labs  Lab 04/25/19 0948 04/26/19 2132 04/27/19 0609 04/28/19 0015  WBC 2.7* 5.5 5.6 8.1  NEUTROABS 1.5* 3.6 4.7 7.0  HGB 11.4*  11.4* 11.2* 12.9*  HCT 35.0* 36.1* 35.2* 42.1  MCV 90.4 92.6 91.4 94.0  PLT 131* 163 180 [id number]   Basic Metabolic Panel: Recent Labs  Lab 04/25/19 0948 04/26/19 2132 04/27/19 0609 04/28/19 0015  NA 137 138 142 144  K 3.7 4.0 4.0 3.7  CL 102 104 108 112*  CO2 23 21* 23 23  GLUCOSE 69* 183* 91 80  BUN 25* 29* 28* 27*  CREATININE 1.31* 1.36* 1.24 1.38*  CALCIUM 7.8* 7.9* 7.9* 8.1*  MG  --   --  2.3 2.2  PHOS  --   --  2.1* 1.5*   GFR: Estimated Creatinine Clearance: 50.3 mL/min (A) (by C-G formula based on SCr of 1.38 mg/dL (H)). Liver Function Tests: Recent Labs  Lab 04/27/19 0609 04/28/19 0015  AST 155* 161*  ALT 52* 58*  ALKPHOS 74 69  BILITOT 0.7 0.9  PROT 6.7 7.4  ALBUMIN 3.2* 3.4*   No results for input(s): LIPASE, AMYLASE in the last 168 hours. No results for input(s): AMMONIA in the last 168 hours. Coagulation Profile: No results for input(s): INR, PROTIME in the last 168 hours. Cardiac Enzymes: No results for input(s): CKTOTAL, CKMB, CKMBINDEX, TROPONINI in the last 168 hours. BNP (last 3 results) No results for input(s): PROBNP in the last 8760 hours. HbA1C: Recent Labs    04/27/19 0609  HGBA1C 5.3   CBG: Recent Labs  Lab 04/27/19 0841 04/27/19 1805 04/27/19 2048 04/28/19 0714 04/28/19 1121  GLUCAP 94 128* 112* 79 93   Lipid Profile: No results for input(s): CHOL, HDL, LDLCALC, TRIG, CHOLHDL, LDLDIRECT in the last 72 hours. Thyroid Function Tests: No results for input(s): TSH, T4TOTAL, FREET4, T3FREE, THYROIDAB in the last 72 hours. Anemia Panel: Recent Labs    04/27/19 0609 04/28/19 0015  FERRITIN 307 347*   Urine analysis:    Component Value Date/Time   COLORURINE AMBER (A) 04/27/2019 0102   APPEARANCEUR CLOUDY (A) 04/27/2019 0102   LABSPEC 1.020 04/27/2019 0102   PHURINE 8.0 04/27/2019 0102   GLUCOSEU NEGATIVE 04/27/2019 0102   HGBUR SMALL (A) 04/27/2019 0102   BILIRUBINUR NEGATIVE 04/27/2019 0102   KETONESUR 20 (A) 04/27/2019  0102   PROTEINUR >=300 (A) 04/27/2019 0102   UROBILINOGEN 0.2 07/25/2014 1828   NITRITE NEGATIVE 04/27/2019 0102   LEUKOCYTESUR SMALL (A) 04/27/2019 0102   Sepsis Labs: @LABRCNTIP (procalcitonin:4,lacticidven:4)  ) Recent Results (from the past 240 hour(s))  Urine culture     Status: Abnormal (Preliminary result)   Collection Time: 04/27/19  1:02 AM   Specimen: Urine, Random  Result Value Ref Range Status   Specimen Description   Final    URINE, RANDOM Performed at [facility], [address], [place], [place] [address]    Special Requests   Final    NONE Performed at [facility], [address] [place], [place] [address]    Culture (A)  Final    >=100,000 COLONIES/mL PROTEUS MIRABILIS SUSCEPTIBILITIES TO FOLLOW Performed at [facility], [place] [address], [place], [place] [address]    Report Status PENDING  Incomplete  Studies: No results found.  Scheduled Meds: . albuterol  2 puff Inhalation Q6H  . vitamin C  500 mg Oral Daily  . atorvastatin  40 mg Oral QHS  . dexamethasone (DECADRON) injection  6 mg Intravenous Q0600  . dextromethorphan-guaiFENesin  1 tablet Oral BID  . docusate sodium  100 mg Oral BID  . enoxaparin (LOVENOX) injection  40 mg Subcutaneous Q12H  . insulin aspart  0-5 Units Subcutaneous QHS  . insulin aspart  0-9 Units Subcutaneous TID WC  . zinc sulfate  220 mg Oral Daily    Continuous Infusions: . ceFEPime (MAXIPIME) IV 2 g (04/28/19 1029)  . levETIRAcetam 500 mg (04/28/19 0814)  . remdesivir 100 mg in NS 100 mL 100 mg (04/28/19 0839)     LOS: 1 day     Annita Brod, MD [facility]  To reach me or the doctor on call, go to: [email or web address] Password [email or web address]  04/28/2019, 4:11 PM

## 2019-04-29 ENCOUNTER — Inpatient Hospital Stay (HOSPITAL_COMMUNITY): Payer: Medicare Other

## 2019-04-29 ENCOUNTER — Inpatient Hospital Stay: Payer: Self-pay

## 2019-04-29 LAB — COMPREHENSIVE METABOLIC PANEL
ALT: 61 U/L — ABNORMAL HIGH (ref 0–44)
AST: 143 U/L — ABNORMAL HIGH (ref 15–41)
Albumin: 2.9 g/dL — ABNORMAL LOW (ref 3.5–5.0)
Alkaline Phosphatase: 55 U/L (ref 38–126)
Anion gap: 9 (ref 5–15)
BUN: 39 mg/dL — ABNORMAL HIGH (ref 8–23)
CO2: 25 mmol/L (ref 22–32)
Calcium: 7.5 mg/dL — ABNORMAL LOW (ref 8.9–10.3)
Chloride: 115 mmol/L — ABNORMAL HIGH (ref 98–111)
Creatinine, Ser: 1.56 mg/dL — ABNORMAL HIGH (ref 0.61–1.24)
GFR calc Af Amer: 52 mL/min — ABNORMAL LOW (ref >60)
GFR calc non Af Amer: 45 mL/min — ABNORMAL LOW (ref >60)
Glucose, Bld: 122 mg/dL — ABNORMAL HIGH (ref 70–99)
Potassium: 3.7 mmol/L (ref 3.5–5.1)
Sodium: 149 mmol/L — ABNORMAL HIGH (ref 135–145)
Total Bilirubin: 1.3 mg/dL — ABNORMAL HIGH (ref 0.3–1.2)
Total Protein: 6.7 g/dL (ref 6.5–8.1)

## 2019-04-29 LAB — GLUCOSE, CAPILLARY
Glucose-Capillary: 107 mg/dL — ABNORMAL HIGH (ref 70–99)
Glucose-Capillary: 102 mg/dL — ABNORMAL HIGH (ref 70–99)
Glucose-Capillary: 115 mg/dL — ABNORMAL HIGH (ref 70–99)
Glucose-Capillary: 177 mg/dL — ABNORMAL HIGH (ref 70–99)

## 2019-04-29 LAB — CBC WITH DIFFERENTIAL/PLATELET
Abs Immature Granulocytes: 0.06 10*3/uL (ref 0.00–0.07)
Basophils Absolute: 0 10*3/uL (ref 0.0–0.1)
Basophils Relative: 0 %
Eosinophils Absolute: 0 10*3/uL (ref 0.0–0.5)
Eosinophils Relative: 0 %
HCT: 34.3 % — ABNORMAL LOW (ref 39.0–52.0)
Hemoglobin: 10.7 g/dL — ABNORMAL LOW (ref 13.0–17.0)
Immature Granulocytes: 1 %
Lymphocytes Relative: 11 %
Lymphs Abs: 1.2 10*3/uL (ref 0.7–4.0)
MCH: 28.5 pg (ref 26.0–34.0)
MCHC: 31.2 g/dL (ref 30.0–36.0)
MCV: 91.5 fL (ref 80.0–100.0)
Monocytes Absolute: 0.5 10*3/uL (ref 0.1–1.0)
Monocytes Relative: 5 %
Neutro Abs: 8.7 10*3/uL — ABNORMAL HIGH (ref 1.7–7.7)
Neutrophils Relative %: 83 %
Platelets: 212 10*3/uL (ref 150–400)
RBC: 3.75 MIL/uL — ABNORMAL LOW (ref 4.22–5.81)
RDW: 13.1 % (ref 11.5–15.5)
WBC: 10.5 10*3/uL (ref 4.0–10.5)
nRBC: 0 % (ref 0.0–0.2)

## 2019-04-29 LAB — FERRITIN: Ferritin: 331 ng/mL (ref 24–336)

## 2019-04-29 LAB — C-REACTIVE PROTEIN: CRP: 28.1 mg/dL — ABNORMAL HIGH (ref <1.0)

## 2019-04-29 LAB — D-DIMER, QUANTITATIVE: D-Dimer, Quant: 8.54 ug/mL-FEU — ABNORMAL HIGH (ref 0.00–0.50)

## 2019-04-29 LAB — PROCALCITONIN: Procalcitonin: 12.05 ng/mL

## 2019-04-29 LAB — URINE CULTURE: Culture: >=100000 — AB

## 2019-04-29 LAB — PHOSPHORUS: Phosphorus: 3 mg/dL (ref 2.5–4.6)

## 2019-04-29 LAB — MAGNESIUM: Magnesium: 2.3 mg/dL (ref 1.7–2.4)

## 2019-04-29 MED ORDER — DEXTROSE-NACL 5-0.45 % IV SOLN: INTRAVENOUS | Status: DC

## 2019-04-29 MED ORDER — CHLORHEXIDINE GLUCONATE CLOTH 2 % EX PADS
6.0000 | MEDICATED_PAD | Freq: Every day | CUTANEOUS | Status: DC
  Administered 2019-04-29 – 2019-05-09 (×11): 6 via TOPICAL

## 2019-04-29 MED ORDER — PIPERACILLIN-TAZOBACTAM 3.375 G IVPB
3.3750 g | Freq: Three times a day (TID) | INTRAVENOUS | Status: DC
  Administered 2019-04-29 – 2019-05-04 (×14): 3.375 g via INTRAVENOUS
  Filled 2019-04-29 (×18): qty 50

## 2019-04-29 MED ORDER — PIPERACILLIN-TAZOBACTAM 3.375 G IVPB
3.3750 g | Freq: Three times a day (TID) | INTRAVENOUS | Status: DC
  Filled 2019-04-29 (×2): qty 50

## 2019-04-29 NOTE — Progress Notes (Signed)
SLP Cancellation Note  Patient Details Name: Jeremy Parks MRN: [id number] DOB: [birth year]   Cancelled treatment:       Reason Eval/Treat Not Completed: Patient at procedure or test/unavailable. Pt in a sterile procedure    Maximum Reiland, Katherene Ponto 04/29/2019, 2:38 PM

## 2019-04-29 NOTE — Consult Note (Signed)
Pharmacy Antibiotic Note  Jeremy Parks is a 68 y.o. male admitted on 04/26/2019 with pneumonia.  Pharmacy has been consulted for Zosyn dosing. His SCr is elevated above his baseline level and rising  Plan: Start Zosyn 3.375g IV q8h (4 hour infusion).  Height: 5\' 8"  (172.7 cm) Weight: 180 lb 12.4 oz (82 kg) IBW/kg (Calculated) : 68.4  Temp (24hrs), Avg:99.4 F (37.4 C), Min:98.5 F (36.9 C), Max:100.8 F (38.2 C)  Recent Labs  Lab 04/25/19 0948 04/26/19 2132 04/27/19 0609 04/28/19 0015 04/29/19 0030  WBC 2.7* 5.5 5.6 8.1 10.5  CREATININE 1.31* 1.36* 1.24 1.38* 1.56*  LATICACIDVEN  --  1.5  --   --   --     Estimated Creatinine Clearance: 44.5 mL/min (A) (by C-G formula based on SCr of 1.56 mg/dL (H)).    No Known Allergies  Antimicrobials this admission: remdesivir 12/24 >> 12/29 Ceftriaxone 12/23 x 1 Cefepime 12/25 >>12/26 Zosyn 12/26 >>   Microbiology results: 12/25 BCx: NGTD 12/24 UCx: P mirabilis   Thank you for allowing pharmacy to be a part of this patient's care.  [name] 04/29/2019 10:56 AM

## 2019-04-29 NOTE — Evaluation (Signed)
Clinical/Bedside Swallow Evaluation Patient Details  Name: Jeremy Parks MRN: [id number] Date of Birth: 05/27/1950  Today's Date: 04/29/2019 Time: SLP Start Time (ACUTE ONLY): 1500 SLP Stop Time (ACUTE ONLY): 1515 SLP Time Calculation (min) (ACUTE ONLY): 15 min  Past Medical History:  Past Medical History:  Diagnosis Date  . Cerebral palsy ([place])   . Diabetes mellitus without complication ([place])   . Gingivitis   . Lipoma of abdominal wall   . Seizures ([place])    Past Surgical History: History reviewed. No pertinent surgical history. HPI:  68 year old male with past medical history of diabetes mellitus, seizure disorder, cerebral palsy and hypertension who resides in a local nursing home who had been seen in the emergency room on 12/18 for slurred speech which had resolved by the time the patient was in the emergency room itself.  He was sent back with no other findings.  However, he has since been to the emergency room several days for confusion or somnolence.  On 12/22 he had a Covid test which came back positive.   Assessment / Plan / Recommendation Clinical Impression  Pt demosntrates altered mentation; he is somewhat lethargic at the time of assessment, but responded verbally and eagerly accepted sips of water. No sign of aspiration observed. When puree was attempted, pt exhibited prolonged oral holding and swallow response could not be triggered with dry spoon, additional sips, verbal instruction, tactile stim to face. SLP asked pt to spit it out and he immediately released everything into a cup. Recommend downgrade to full liquids at this time. Will f/u for potential upgrade.  SLP Visit Diagnosis: Dysphagia, unspecified (R13.10)    Aspiration Risk  Risk for inadequate nutrition/hydration    Diet Recommendation Thin liquid   Liquid Administration via: Straw;Cup Medication Administration: Other (Comment)(crushed in liquid) Supervision: Staff to assist with self  feeding Compensations: Slow rate;Small sips/bites Postural Changes: Seated upright at 90 degrees    Other  Recommendations Oral Care Recommendations: Oral care BID Other Recommendations: Have oral suction available   Follow up Recommendations 24 hour supervision/assistance      Frequency and Duration min 2x/week  2 weeks       Prognosis Prognosis for Safe Diet Advancement: Fair Barriers to Reach Goals: Cognitive deficits      Swallow Study   General HPI: 68 year old male with past medical history of diabetes mellitus, seizure disorder, cerebral palsy and hypertension who resides in a local nursing home who had been seen in the emergency room on 12/18 for slurred speech which had resolved by the time the patient was in the emergency room itself.  He was sent back with no other findings.  However, he has since been to the emergency room several days for confusion or somnolence.  On 12/22 he had a Covid test which came back positive. Type of Study: Bedside Swallow Evaluation Diet Prior to this Study: Regular;Thin liquids Temperature Spikes Noted: No Respiratory Status: Room air History of Recent Intubation: No Behavior/Cognition: Lethargic/Drowsy Oral Care Completed by SLP: No Self-Feeding Abilities: Total assist Patient Positioning: Upright in bed Baseline Vocal Quality: Low vocal intensity Volitional Cough: Cognitively unable to elicit    Oral/Motor/Sensory Function Overall Oral Motor/Sensory Function: Other (comment)(would not follow commands)   Ice Chips Ice chips: Impaired Presentation: Spoon Oral Phase Functional Implications: Prolonged oral transit;Oral holding   Thin Liquid Thin Liquid: Within functional limits Presentation: Straw    Nectar Thick Nectar Thick Liquid: Not tested   Honey Thick Honey Thick Liquid: Not tested  Puree Puree: Impaired Presentation: Spoon Oral Phase Functional Implications: Oral holding   Solid     Solid: Not tested     Herbie [place], MA CCC-SLP  Acute Rehabilitation Services Pager [phone] Office [phone]  Henli Hey, Katherene Ponto 04/29/2019,3:55 PM

## 2019-04-29 NOTE — Progress Notes (Signed)
PROGRESS NOTE  Jeremy Parks [id number] DOB: 09-29-1950 DOA: 04/26/2019 PCP: Sinda Du, MD  HPI/Recap of past 61 hours: 68 year old male with past medical history of diabetes mellitus, seizure disorder, cerebral palsy and hypertension who resides in a local nursing home who had been seen in the emergency room on 12/18 for slurred speech which had resolved by the time the patient was in the emergency room itself.  He was sent back with no other findings.  However, he has since been to the emergency room several days for confusion or somnolence.  On 12/22 he had a Covid test which came back positive.  He came in on the night of 12/23 for increased somnolence in the emergency room, was noted to have a temperature of 101.3.  Chest x-ray noted evidence of patchy opacity at his left lung base and given his change in mentation plus positive Covid test, it was felt best that he come in to the hospitalist service.  He did develop some mild hypoxia requiring 2 L nasal cannula.  He had a questionable UTI and was given a dose of IV Rocephin.  Started on IV Decadron and Remdisivir on early morning of 12/24.  Transferred to [facility].  Following arrival to [facility], had episodes of vomiting.  Resolved with some Zofran.  On 12/25 morning, still less than responsive although more tachycardic, increased respiratory rate and with elevated CRP, decision made to give Actemra. Also found to have urinary retention and catheter placed.    IV access lost overnight and unable to replace.  Patient has not been able to get his medications today.  PICC line ordered.  Patient himself a little bit more awake this morning and able to answer a few questions.  Patient able to be weaned off of oxygen today.  Assessment/Plan: Principal Problem:   Acute hypoxemic respiratory failure due to COVID-19 [facility]): Continue supplemental oxygen.  Status post Actemra given elevated CRP and hypoxia.  (Patient is a ward of  the state and it appears caseworker assigned to him out of office for the holiday weekend, so Actemra given emergently.)  Continue remdesivir and steroids once IV reestablished.   Active Problems:   Cerebral palsy ([place])   Diabetes mellitus without complication ([place]): Following CBGs, especially while on steroids.  Currently stable    Seizures ([place]): Since taking PO seems to be in question, will change home oral Tegretol to IV Keppra  Overweight: Patient meets criteria for BMI greater than 25    Essential hypertension: IV lopressor until able to take PO    Normocytic anemia: Stable    Hyperlipidemia: Continue statin.  Patient has no evidence of UTI. (Initially mentioned on ER note)  Nausea/Vomiting: Check swallow eval. will have to wait until tomorrow given limited staffing on holiday.  Looks to have resolved   AKI (acute kidney injury) [facility]): Initially present on admission, resolved with fluids.  Urinary retention: Have started a foley catheter   Code Status: Full code  Family Communication: No family listed.  Called case worker-currently out of office for holiday   Disposition Plan: Return back to nursing home once more awake   Consultants:  None  Procedures:  None   Antimicrobials:  IV Remdisivir 12/24-12/25.  Has not received 4/26 dose yet  IV Rocephin x1 dose 12/23  IV Actemra x1 dose 12/25  DVT prophylaxis:  Lovenox   Objective: Vitals:   04/29/19 1000 04/29/19 1100  BP: (!) 110/47 (!) 121/48  Pulse: 93 96  Resp: (!) 24 18  Temp: 99.9 F (37.7 C) 99.7 F (37.6 C)  SpO2: 100% 100%    Intake/Output Summary (Last 24 hours) at 04/29/2019 1305 Last data filed at 04/28/2019 1527 Gross per 24 hour  Intake 100 ml  Output 410 ml  Net -310 ml   Filed Weights   04/26/19 1925  Weight: 82 kg   Body mass index is 27.49 kg/m.  Exam:   General: Somnolent, but able to be easily awoken  HEENT: Normocephalic, atraumatic, mucous membranes slightly dry    Neck: Supple, no JVD  Cardiovascular: Regular rhythm  Respiratory: Clear to auscultation bilaterally  Abdomen: Soft, nondistended, hypoactive bowel sounds  Musculoskeletal: No clubbing or cyanosis or edema  Psychiatry: Somnolent, no evidence of psychoses   Data Reviewed: CBC: Recent Labs  Lab 04/25/19 0948 04/26/19 2132 04/27/19 0609 04/28/19 0015 04/29/19 0030  WBC 2.7* 5.5 5.6 8.1 10.5  NEUTROABS 1.5* 3.6 4.7 7.0 8.7*  HGB 11.4* 11.4* 11.2* 12.9* 10.7*  HCT 35.0* 36.1* 35.2* 42.1 34.3*  MCV 90.4 92.6 91.4 94.0 91.5  PLT 131* 163 180 203 [id number]   Basic Metabolic Panel: Recent Labs  Lab 04/25/19 0948 04/26/19 2132 04/27/19 0609 04/28/19 0015 04/29/19 0030  NA 137 138 142 144 149*  K 3.7 4.0 4.0 3.7 3.7  CL 102 104 108 112* 115*  CO2 23 21* 23 23 25   GLUCOSE 69* 183* 91 80 122*  BUN 25* 29* 28* 27* 39*  CREATININE 1.31* 1.36* 1.24 1.38* 1.56*  CALCIUM 7.8* 7.9* 7.9* 8.1* 7.5*  MG  --   --  2.3 2.2 2.3  PHOS  --   --  2.1* 1.5* 3.0   GFR: Estimated Creatinine Clearance: 44.5 mL/min (A) (by C-G formula based on SCr of 1.56 mg/dL (H)). Liver Function Tests: Recent Labs  Lab 04/27/19 0609 04/28/19 0015 04/29/19 0030  AST 155* 161* 143*  ALT 52* 58* 61*  ALKPHOS 74 69 55  BILITOT 0.7 0.9 1.3*  PROT 6.7 7.4 6.7  ALBUMIN 3.2* 3.4* 2.9*   No results for input(s): LIPASE, AMYLASE in the last 168 hours. No results for input(s): AMMONIA in the last 168 hours. Coagulation Profile: No results for input(s): INR, PROTIME in the last 168 hours. Cardiac Enzymes: No results for input(s): CKTOTAL, CKMB, CKMBINDEX, TROPONINI in the last 168 hours. BNP (last 3 results) No results for input(s): PROBNP in the last 8760 hours. HbA1C: Recent Labs    04/27/19 0609  HGBA1C 5.3   CBG: Recent Labs  Lab 04/28/19 1121 04/28/19 1712 04/28/19 2204 04/29/19 0702 04/29/19 1128  GLUCAP 93 113* 133* 107* 102*   Lipid Profile: No results for input(s): CHOL, HDL,  LDLCALC, TRIG, CHOLHDL, LDLDIRECT in the last 72 hours. Thyroid Function Tests: No results for input(s): TSH, T4TOTAL, FREET4, T3FREE, THYROIDAB in the last 72 hours. Anemia Panel: Recent Labs    04/28/19 0015 04/29/19 0030  FERRITIN 347* 331   Urine analysis:    Component Value Date/Time   COLORURINE AMBER (A) 04/27/2019 0102   APPEARANCEUR CLOUDY (A) 04/27/2019 0102   LABSPEC 1.020 04/27/2019 0102   PHURINE 8.0 04/27/2019 0102   GLUCOSEU NEGATIVE 04/27/2019 0102   HGBUR SMALL (A) 04/27/2019 0102   BILIRUBINUR NEGATIVE 04/27/2019 0102   KETONESUR 20 (A) 04/27/2019 0102   PROTEINUR >=300 (A) 04/27/2019 0102   UROBILINOGEN 0.2 07/25/2014 1828   NITRITE NEGATIVE 04/27/2019 0102   LEUKOCYTESUR SMALL (A) 04/27/2019 0102   Sepsis Labs: @LABRCNTIP (procalcitonin:4,lacticidven:4)  ) Recent Results (from  the past 240 hour(s))  Urine culture     Status: Abnormal   Collection Time: 04/27/19  1:02 AM   Specimen: Urine, Random  Result Value Ref Range Status   Specimen Description   Final    URINE, RANDOM Performed at [facility], [address], [place], [place] [address]    Special Requests   Final    NONE Performed at [facility], [address] [place], [place] [address]    Culture >=100,000 COLONIES/mL PROTEUS MIRABILIS (A)  Final   Report Status 04/29/2019 FINAL  Final   Organism ID, Bacteria PROTEUS MIRABILIS (A)  Final      Susceptibility   Proteus mirabilis - MIC*    AMPICILLIN <=2 SENSITIVE Sensitive     CEFAZOLIN <=4 SENSITIVE Sensitive     CEFTRIAXONE <=1 SENSITIVE Sensitive     CIPROFLOXACIN <=0.25 SENSITIVE Sensitive     GENTAMICIN <=1 SENSITIVE Sensitive     IMIPENEM 2 SENSITIVE Sensitive     NITROFURANTOIN 128 RESISTANT Resistant     TRIMETH/SULFA <=20 SENSITIVE Sensitive     AMPICILLIN/SULBACTAM <=2 SENSITIVE Sensitive     PIP/TAZO <=4 SENSITIVE Sensitive     * >=100,000 COLONIES/mL PROTEUS MIRABILIS  Culture, blood (routine x 2)     Status: None  (Preliminary result)   Collection Time: 04/28/19 12:30 PM   Specimen: BLOOD  Result Value Ref Range Status   Specimen Description   Final    BLOOD RIGHT ANTECUBITAL Performed at [facility] [address] [place], [place] [address]    Special Requests   Final    BOTTLES DRAWN AEROBIC ONLY Blood Culture adequate volume Performed at [facility] [address] [place], [place] [address]    Culture   Final    NO GROWTH < 12 HOURS Performed at [facility] [address] [place], [place] [address]    Report Status PENDING  Incomplete  Culture, blood (routine x 2)     Status: None (Preliminary result)   Collection Time: 04/28/19 12:35 PM   Specimen: BLOOD  Result Value Ref Range Status   Specimen Description   Final    BLOOD RIGHT HAND Performed at [facility] [address] [place], [place] [address]    Special Requests   Final    BOTTLES DRAWN AEROBIC ONLY Blood Culture adequate volume Performed at [facility] [address], [place], [place] [address]    Culture   Final    NO GROWTH < 12 HOURS Performed at [facility] [address], [place], [place] [address]    Report Status PENDING  Incomplete      Studies: [place] EKG SITE RITE  Result Date: 04/29/2019 If Site Rite image not attached, placement could not be confirmed due to current cardiac rhythm.   Scheduled Meds: . albuterol  2 puff Inhalation Q6H  . vitamin C  500 mg Oral Daily  . atorvastatin  40 mg Oral QHS  . dexamethasone (DECADRON) injection  6 mg Intravenous Q0600  . dextromethorphan-guaiFENesin  1 tablet Oral BID  . docusate sodium  100 mg Oral BID  . enoxaparin (LOVENOX) injection  40 mg Subcutaneous Q12H  . insulin aspart  0-5 Units Subcutaneous QHS  . insulin aspart  0-9 Units Subcutaneous TID WC  . zinc sulfate  220 mg Oral Daily    Continuous Infusions: . dextrose 5 % and 0.45% NaCl    .  levETIRAcetam 500  mg (04/28/19 2026)  . piperacillin-tazobactam (ZOSYN)  IV    . remdesivir 100 mg in NS 100 mL 100 mg (04/28/19 0839)     LOS: 2 days     Annita Brod, MD [facility]  To reach me or the doctor on call, go to: [email or web address] Password [email or web address]  04/29/2019, 1:05 PM

## 2019-04-29 NOTE — Plan of Care (Signed)
  Problem: Education: Goal: Knowledge of risk factors and measures for prevention of condition will improve Outcome: Progressing   Problem: Coping: Goal: Psychosocial and spiritual needs will be supported Outcome: Progressing   Problem: Respiratory: Goal: Will maintain a patent airway Outcome: Progressing Goal: Complications related to the disease process, condition or treatment will be avoided or minimized Outcome: Progressing   

## 2019-04-29 NOTE — Progress Notes (Signed)
   04/29/19 1500  Family/Significant Other Communication  Family/Significant Other Update Called;Other (Comment)  Tried to call an give an update and also get permission from guardian about PICC consent but no answer.

## 2019-04-29 NOTE — Progress Notes (Signed)
Secure Chat with Dr. [place] Pink c/o PICC. Patient had blood cultures drawn this morning. Dr. [place] Pink was informed of waiting 24-48 hours pending blood culture results and delay placing PICC. Per Dr. [place] Pink, the patient's need for PICC outweighed positive blood cultures. Plan is to move forward with PICC placement.

## 2019-04-29 NOTE — Plan of Care (Signed)
Went over Jeremy Parks with pt. Pt only oriented to self. Tried to call and give an update to guardian several times. No answer.

## 2019-04-29 NOTE — Progress Notes (Signed)
Peripherally Inserted Central Catheter/Midline Placement  The IV Nurse has discussed with the patient and/or persons authorized to consent for the patient, the purpose of this procedure and the potential benefits and risks involved with this procedure.  The benefits include less needle sticks, lab draws from the catheter, and the patient may be discharged home with the catheter. Risks include, but not limited to, infection, bleeding, blood clot (thrombus formation), and puncture of an artery; nerve damage and irregular heartbeat and possibility to perform a PICC exchange if needed/ordered by physician.  Alternatives to this procedure were also discussed.  Bard Power PICC patient education guide, fact sheet on infection prevention and patient information card has been provided to patient /or left at bedside.    PICC/Midline Placement Documentation  PICC Double Lumen 04/29/19 PICC Right Cephalic 36 cm 0 cm (Active)  Indication for Insertion or Continuance of Line Poor Vasculature-patient has had multiple peripheral attempts or PIVs lasting less than 24 hours 04/29/19 1423  Exposed Catheter (cm) 0 cm 04/29/19 1423  Site Assessment Clean;Dry;Intact 04/29/19 1423  Lumen #1 Status Flushed;Blood return noted 04/29/19 1423  Lumen #2 Status Flushed;No blood return 04/29/19 1423  Dressing Type Transparent 04/29/19 1423  Dressing Status Clean;Dry;Intact;Antimicrobial disc in place 04/29/19 1423  Dressing Change Due 05/06/19 04/29/19 1423       Scotty Court 04/29/2019, 2:26 PM

## 2019-04-30 DIAGNOSIS — J690 Pneumonitis due to inhalation of food and vomit: Secondary | ICD-10-CM | POA: Diagnosis present

## 2019-04-30 DIAGNOSIS — E870 Hyperosmolality and hypernatremia: Secondary | ICD-10-CM | POA: Diagnosis not present

## 2019-04-30 LAB — CBC WITH DIFFERENTIAL/PLATELET
Abs Immature Granulocytes: 0.05 10*3/uL (ref 0.00–0.07)
Basophils Absolute: 0 10*3/uL (ref 0.0–0.1)
Basophils Relative: 0 %
Eosinophils Absolute: 0 10*3/uL (ref 0.0–0.5)
Eosinophils Relative: 0 %
HCT: 32.4 % — ABNORMAL LOW (ref 39.0–52.0)
Hemoglobin: 10.2 g/dL — ABNORMAL LOW (ref 13.0–17.0)
Immature Granulocytes: 1 %
Lymphocytes Relative: 10 %
Lymphs Abs: 0.8 10*3/uL (ref 0.7–4.0)
MCH: 29.4 pg (ref 26.0–34.0)
MCHC: 31.5 g/dL (ref 30.0–36.0)
MCV: 93.4 fL (ref 80.0–100.0)
Monocytes Absolute: 0.5 10*3/uL (ref 0.1–1.0)
Monocytes Relative: 7 %
Neutro Abs: 6.4 10*3/uL (ref 1.7–7.7)
Neutrophils Relative %: 82 %
Platelets: 241 10*3/uL (ref 150–400)
RBC: 3.47 MIL/uL — ABNORMAL LOW (ref 4.22–5.81)
RDW: 13.3 % (ref 11.5–15.5)
WBC: 7.8 10*3/uL (ref 4.0–10.5)
nRBC: 0 % (ref 0.0–0.2)

## 2019-04-30 LAB — GLUCOSE, CAPILLARY
Glucose-Capillary: 214 mg/dL — ABNORMAL HIGH (ref 70–99)
Glucose-Capillary: 207 mg/dL — ABNORMAL HIGH (ref 70–99)
Glucose-Capillary: 144 mg/dL — ABNORMAL HIGH (ref 70–99)
Glucose-Capillary: 147 mg/dL — ABNORMAL HIGH (ref 70–99)

## 2019-04-30 LAB — COMPREHENSIVE METABOLIC PANEL
ALT: 53 U/L — ABNORMAL HIGH (ref 0–44)
AST: 88 U/L — ABNORMAL HIGH (ref 15–41)
Albumin: 2.7 g/dL — ABNORMAL LOW (ref 3.5–5.0)
Alkaline Phosphatase: 54 U/L (ref 38–126)
Anion gap: 10 (ref 5–15)
BUN: 31 mg/dL — ABNORMAL HIGH (ref 8–23)
CO2: 23 mmol/L (ref 22–32)
Calcium: 7.5 mg/dL — ABNORMAL LOW (ref 8.9–10.3)
Chloride: 116 mmol/L — ABNORMAL HIGH (ref 98–111)
Creatinine, Ser: 1.35 mg/dL — ABNORMAL HIGH (ref 0.61–1.24)
GFR calc Af Amer: >60 mL/min (ref >60)
GFR calc non Af Amer: 54 mL/min — ABNORMAL LOW (ref >60)
Glucose, Bld: 213 mg/dL — ABNORMAL HIGH (ref 70–99)
Potassium: 4.4 mmol/L (ref 3.5–5.1)
Sodium: 149 mmol/L — ABNORMAL HIGH (ref 135–145)
Total Bilirubin: 1 mg/dL (ref 0.3–1.2)
Total Protein: 6.5 g/dL (ref 6.5–8.1)

## 2019-04-30 LAB — PHOSPHORUS: Phosphorus: 1.6 mg/dL — ABNORMAL LOW (ref 2.5–4.6)

## 2019-04-30 LAB — D-DIMER, QUANTITATIVE: D-Dimer, Quant: 4.38 ug/mL-FEU — ABNORMAL HIGH (ref 0.00–0.50)

## 2019-04-30 LAB — MAGNESIUM: Magnesium: 2.5 mg/dL — ABNORMAL HIGH (ref 1.7–2.4)

## 2019-04-30 LAB — PROCALCITONIN: Procalcitonin: 4.48 ng/mL

## 2019-04-30 LAB — FERRITIN: Ferritin: 310 ng/mL (ref 24–336)

## 2019-04-30 LAB — C-REACTIVE PROTEIN: CRP: 22.6 mg/dL — ABNORMAL HIGH (ref <1.0)

## 2019-04-30 MED ORDER — ACETAMINOPHEN 650 MG RE SUPP
650.0000 mg | Freq: Once | RECTAL | Status: AC
  Administered 2019-04-30: 650 mg via RECTAL

## 2019-04-30 MED ORDER — DEXTROSE 5 % IV SOLN: INTRAVENOUS | Status: DC

## 2019-04-30 NOTE — Plan of Care (Signed)
  Problem: Education: Goal: Knowledge of risk factors and measures for prevention of condition will improve Outcome: Progressing   Problem: Coping: Goal: Psychosocial and spiritual needs will be supported Outcome: Progressing   Problem: Respiratory: Goal: Will maintain a patent airway Outcome: Progressing Goal: Complications related to the disease process, condition or treatment will be avoided or minimized Outcome: Progressing   

## 2019-04-30 NOTE — Progress Notes (Addendum)
PROGRESS NOTE  Jeremy Parks [id number] DOB: November 14, 1950 DOA: 04/26/2019 PCP: Sinda Du, MD  HPI/Recap of past 75 hours: 68 year old male with past medical history of diabetes mellitus, seizure disorder, cerebral palsy and hypertension who resides in a local nursing home who had been seen in the emergency room on 12/18 for slurred speech which had resolved by the time the patient was in the emergency room itself.  He was sent back with no other findings.  However, he has since been to the emergency room several days for confusion or somnolence.  On 12/22 he had a Covid test which came back positive.  He came in on the night of 12/23 for increased somnolence in the emergency room, was noted to have a temperature of 101.3.  Chest x-ray noted evidence of patchy opacity at his left lung base and given his change in mentation plus positive Covid test, it was felt best that he come in to the hospitalist service.  He did develop some mild hypoxia requiring 2 L nasal cannula.  He had a questionable UTI and was given a dose of IV Rocephin.  Started on IV Decadron and Remdisivir on early morning of 12/24.  Transferred to [facility].  Following arrival to [facility], had episodes of vomiting.  Resolved with some Zofran.  On 12/25 morning, still less than responsive although more tachycardic, increased respiratory rate and with elevated CRP, decision made to give Actemra. Also found to have urinary retention and catheter placed.  12/26: PICC line placed after IV access lost, weaned off of oxygen and significantly elevated procalcitonin of 12.05 so started on IV Zosyn for presumed aspiration pneumonia  Overnight, no events.  He continues to still spike fevers although rest of labs look stable.  A little less responsive today and he is refusing to take p.o.  Procalcitonin level and CRP trending downward today.  Assessment/Plan: Principal Problem:   Acute hypoxemic respiratory failure due to  COVID-19 (HCC)/presumed aspiration pneumonia: Weaned off of oxygen as of 12/26.  Status post Actemra given elevated CRP and hypoxia.  (Patient is a ward of the state and it appears caseworker assigned to him out of office for the holiday weekend, so Actemra given emergently.)  Continue remdesivir and steroids.  On Zosyn for presumed aspiration pneumonia, given elevated procalcitonin level Active Problems:   Cerebral palsy ([place])    Diabetes mellitus without complication ([place]): Following CBGs, especially while on steroids and getting D5 and his IV fluids.  Stable with a few CBGs over 200  Altered mental status: May be initially from hypoxia and now possibly from hyponatremia.  Hypernatremia: Sodium has trended upward and has been at 149 for the last 2 days, likely from very poor p.o. intake.  No improvement with half-normal saline, so we will change to D5W    Seizures ([place]): Since taking PO seems to be in question, will change home oral Tegretol to IV Keppra  Overweight: Patient meets criteria for BMI greater than 25    Essential hypertension: IV lopressor until able/willing to take PO    Normocytic anemia: Stable    Hyperlipidemia: Continue statin.  Patient has no evidence of UTI. (Initially mentioned on ER note)  Nausea/Vomiting: Check swallow eval. will have to wait until tomorrow given limited staffing on holiday.  Nausea and vomiting looks to have resolved   AKI (acute kidney injury) [facility]): Initially present on admission, resolved with fluids.  Urinary retention: Have started a foley catheter   Code Status:  Full code  Family Communication: No family listed.  Called case worker-currently out of office for holiday   Disposition Plan: Return back to nursing home once more awake   Consultants:  None  Procedures:  None   Antimicrobials:  IV Remdisivir 12/24-12/25.  Has not received 4/26 dose yet  IV Rocephin x1 dose 12/23  IV Actemra x1 dose 12/25  DVT prophylaxis:   Lovenox   Objective: Vitals:   04/30/19 1600 04/30/19 1615  BP: 138/78   Pulse: 95   Resp: 18   Temp:  (!) 101.2 F (38.4 C)  SpO2: 95%     Intake/Output Summary (Last 24 hours) at 04/30/2019 1813 Last data filed at 04/30/2019 1138 Gross per 24 hour  Intake --  Output 875 ml  Net -875 ml   Filed Weights   04/26/19 1925  Weight: 82 kg   Body mass index is 27.49 kg/m.  Exam:   General: Appears to be resting comfortably and awake, but does not really respond or answer any questions for me.  HEENT: Normocephalic, atraumatic, mucous membranes slightly dry   Neck: Supple, no JVD  Cardiovascular: Regular rhythm  Respiratory: Clear to auscultation bilaterally, decreased at bases, moderate inspiratory effort  Abdomen: Soft, nondistended, hypoactive bowel sounds  Musculoskeletal: No clubbing or cyanosis or edema  Psychiatry: Somnolent, no evidence of psychoses   Data Reviewed: CBC: Recent Labs  Lab 04/26/19 2132 04/27/19 0609 04/28/19 0015 04/29/19 0030 04/30/19 0725  WBC 5.5 5.6 8.1 10.5 7.8  NEUTROABS 3.6 4.7 7.0 8.7* 6.4  HGB 11.4* 11.2* 12.9* 10.7* 10.2*  HCT 36.1* 35.2* 42.1 34.3* 32.4*  MCV 92.6 91.4 94.0 91.5 93.4  PLT 163 180 203 212 [id number]   Basic Metabolic Panel: Recent Labs  Lab 04/26/19 2132 04/27/19 0609 04/28/19 0015 04/29/19 0030 04/30/19 0725  NA 138 142 144 149* 149*  K 4.0 4.0 3.7 3.7 4.4  CL 104 108 112* 115* 116*  CO2 21* 23 23 25 23   GLUCOSE 183* 91 80 122* 213*  BUN 29* 28* 27* 39* 31*  CREATININE 1.36* 1.24 1.38* 1.56* 1.35*  CALCIUM 7.9* 7.9* 8.1* 7.5* 7.5*  MG  --  2.3 2.2 2.3 2.5*  PHOS  --  2.1* 1.5* 3.0 1.6*   GFR: Estimated Creatinine Clearance: 51.4 mL/min (A) (by C-G formula based on SCr of 1.35 mg/dL (H)). Liver Function Tests: Recent Labs  Lab 04/27/19 0609 04/28/19 0015 04/29/19 0030 04/30/19 0725  AST 155* 161* 143* 88*  ALT 52* 58* 61* 53*  ALKPHOS 74 69 55 54  BILITOT 0.7 0.9 1.3* 1.0  PROT 6.7 7.4  6.7 6.5  ALBUMIN 3.2* 3.4* 2.9* 2.7*   No results for input(s): LIPASE, AMYLASE in the last 168 hours. No results for input(s): AMMONIA in the last 168 hours. Coagulation Profile: No results for input(s): INR, PROTIME in the last 168 hours. Cardiac Enzymes: No results for input(s): CKTOTAL, CKMB, CKMBINDEX, TROPONINI in the last 168 hours. BNP (last 3 results) No results for input(s): PROBNP in the last 8760 hours. HbA1C: No results for input(s): HGBA1C in the last 72 hours. CBG: Recent Labs  Lab 04/29/19 1606 04/29/19 2039 04/30/19 0744 04/30/19 1052 04/30/19 1559  GLUCAP 115* 177* 214* 207* 144*   Lipid Profile: No results for input(s): CHOL, HDL, LDLCALC, TRIG, CHOLHDL, LDLDIRECT in the last 72 hours. Thyroid Function Tests: No results for input(s): TSH, T4TOTAL, FREET4, T3FREE, THYROIDAB in the last 72 hours. Anemia Panel: Recent Labs    04/29/19 0030 04/30/19  0725  FERRITIN 331 310   Urine analysis:    Component Value Date/Time   COLORURINE AMBER (A) 04/27/2019 0102   APPEARANCEUR CLOUDY (A) 04/27/2019 0102   LABSPEC 1.020 04/27/2019 0102   PHURINE 8.0 04/27/2019 0102   GLUCOSEU NEGATIVE 04/27/2019 0102   HGBUR SMALL (A) 04/27/2019 0102   BILIRUBINUR NEGATIVE 04/27/2019 0102   KETONESUR 20 (A) 04/27/2019 0102   PROTEINUR >=300 (A) 04/27/2019 0102   UROBILINOGEN 0.2 07/25/2014 1828   NITRITE NEGATIVE 04/27/2019 0102   LEUKOCYTESUR SMALL (A) 04/27/2019 0102   Sepsis Labs: @LABRCNTIP (procalcitonin:4,lacticidven:4)  ) Recent Results (from the past 240 hour(s))  Urine culture     Status: Abnormal   Collection Time: 04/27/19  1:02 AM   Specimen: Urine, Random  Result Value Ref Range Status   Specimen Description   Final    URINE, RANDOM Performed at [facility], [address], [place], [place] [address]    Special Requests   Final    NONE Performed at [facility], [address] [place], [place] [address]    Culture >=100,000 COLONIES/mL PROTEUS  MIRABILIS (A)  Final   Report Status 04/29/2019 FINAL  Final   Organism ID, Bacteria PROTEUS MIRABILIS (A)  Final      Susceptibility   Proteus mirabilis - MIC*    AMPICILLIN <=2 SENSITIVE Sensitive     CEFAZOLIN <=4 SENSITIVE Sensitive     CEFTRIAXONE <=1 SENSITIVE Sensitive     CIPROFLOXACIN <=0.25 SENSITIVE Sensitive     GENTAMICIN <=1 SENSITIVE Sensitive     IMIPENEM 2 SENSITIVE Sensitive     NITROFURANTOIN 128 RESISTANT Resistant     TRIMETH/SULFA <=20 SENSITIVE Sensitive     AMPICILLIN/SULBACTAM <=2 SENSITIVE Sensitive     PIP/TAZO <=4 SENSITIVE Sensitive     * >=100,000 COLONIES/mL PROTEUS MIRABILIS  Culture, blood (routine x 2)     Status: None (Preliminary result)   Collection Time: 04/28/19 12:30 PM   Specimen: BLOOD  Result Value Ref Range Status   Specimen Description   Final    BLOOD RIGHT ANTECUBITAL Performed at [facility], [place] [address] [place], [place] [address]    Special Requests   Final    BOTTLES DRAWN AEROBIC ONLY Blood Culture adequate volume Performed at [place] [address], [place], [place] [address]    Culture   Final    NO GROWTH 2 DAYS Performed at [facility] [address] [place], [place] [address]    Report Status PENDING  Incomplete  Culture, blood (routine x 2)     Status: None (Preliminary result)   Collection Time: 04/28/19 12:35 PM   Specimen: BLOOD  Result Value Ref Range Status   Specimen Description   Final    BLOOD RIGHT HAND Performed at [place] [address], [place], [place] [address]    Special Requests   Final    BOTTLES DRAWN AEROBIC ONLY Blood Culture adequate volume Performed at [facility] [address] [place], [place] [address]    Culture   Final    NO GROWTH 2 DAYS Performed at [facility] [address] [place], [place] [address]    Report Status PENDING  Incomplete      Studies: No results  found.  Scheduled Meds: . albuterol  2 puff Inhalation Q6H  . vitamin C  500 mg Oral Daily  . atorvastatin  40 mg Oral QHS  . Chlorhexidine Gluconate  Cloth  6 each Topical Daily  . dexamethasone (DECADRON) injection  6 mg Intravenous Q0600  . dextromethorphan-guaiFENesin  1 tablet Oral BID  . docusate sodium  100 mg Oral BID  . enoxaparin (LOVENOX) injection  40 mg Subcutaneous Q12H  . insulin aspart  0-5 Units Subcutaneous QHS  . insulin aspart  0-9 Units Subcutaneous TID WC  . zinc sulfate  220 mg Oral Daily    Continuous Infusions: . dextrose 5 % and 0.45% NaCl 100 mL/hr at 04/30/19 1448  . levETIRAcetam Stopped (04/30/19 0800)  . piperacillin-tazobactam (ZOSYN)  IV 3.375 g (04/30/19 1138)  . remdesivir 100 mg in NS 100 mL 100 mg (04/30/19 1057)     LOS: 3 days     Annita Brod, MD [facility]  To reach me or the doctor on call, go to: [email or web address] Password [facility]  04/30/2019, 6:13 PM

## 2019-05-01 LAB — COMPREHENSIVE METABOLIC PANEL WITH GFR
ALT: 49 U/L — ABNORMAL HIGH (ref 0–44)
AST: 70 U/L — ABNORMAL HIGH (ref 15–41)
Albumin: 2.5 g/dL — ABNORMAL LOW (ref 3.5–5.0)
Alkaline Phosphatase: 50 U/L (ref 38–126)
Anion gap: 7 (ref 5–15)
BUN: 18 mg/dL (ref 8–23)
CO2: 24 mmol/L (ref 22–32)
Calcium: 7.4 mg/dL — ABNORMAL LOW (ref 8.9–10.3)
Chloride: 115 mmol/L — ABNORMAL HIGH (ref 98–111)
Creatinine, Ser: 1.29 mg/dL — ABNORMAL HIGH (ref 0.61–1.24)
GFR calc Af Amer: >60 mL/min
GFR calc non Af Amer: 57 mL/min — ABNORMAL LOW
Glucose, Bld: 202 mg/dL — ABNORMAL HIGH (ref 70–99)
Potassium: 3.2 mmol/L — ABNORMAL LOW (ref 3.5–5.1)
Sodium: 146 mmol/L — ABNORMAL HIGH (ref 135–145)
Total Bilirubin: 1 mg/dL (ref 0.3–1.2)
Total Protein: 6.2 g/dL — ABNORMAL LOW (ref 6.5–8.1)

## 2019-05-01 LAB — GLUCOSE, CAPILLARY
Glucose-Capillary: 101 mg/dL — ABNORMAL HIGH (ref 70–99)
Glucose-Capillary: 167 mg/dL — ABNORMAL HIGH (ref 70–99)
Glucose-Capillary: 165 mg/dL — ABNORMAL HIGH (ref 70–99)
Glucose-Capillary: 164 mg/dL — ABNORMAL HIGH (ref 70–99)
Glucose-Capillary: 168 mg/dL — ABNORMAL HIGH (ref 70–99)

## 2019-05-01 LAB — CBC WITH DIFFERENTIAL/PLATELET
Abs Immature Granulocytes: 0.06 K/uL (ref 0.00–0.07)
Basophils Absolute: 0 K/uL (ref 0.0–0.1)
Basophils Relative: 0 %
Eosinophils Absolute: 0.1 K/uL (ref 0.0–0.5)
Eosinophils Relative: 1 %
HCT: 30.4 % — ABNORMAL LOW (ref 39.0–52.0)
Hemoglobin: 9.5 g/dL — ABNORMAL LOW (ref 13.0–17.0)
Immature Granulocytes: 1 %
Lymphocytes Relative: 14 %
Lymphs Abs: 0.9 K/uL (ref 0.7–4.0)
MCH: 28.5 pg (ref 26.0–34.0)
MCHC: 31.3 g/dL (ref 30.0–36.0)
MCV: 91.3 fL (ref 80.0–100.0)
Monocytes Absolute: 0.6 K/uL (ref 0.1–1.0)
Monocytes Relative: 9 %
Neutro Abs: 4.9 K/uL (ref 1.7–7.7)
Neutrophils Relative %: 75 %
Platelets: 248 K/uL (ref 150–400)
RBC: 3.33 MIL/uL — ABNORMAL LOW (ref 4.22–5.81)
RDW: 12.9 % (ref 11.5–15.5)
WBC: 6.5 K/uL (ref 4.0–10.5)
nRBC: 0 % (ref 0.0–0.2)

## 2019-05-01 LAB — PHOSPHORUS: Phosphorus: 1.3 mg/dL — ABNORMAL LOW (ref 2.5–4.6)

## 2019-05-01 LAB — FERRITIN: Ferritin: 298 ng/mL (ref 24–336)

## 2019-05-01 LAB — D-DIMER, QUANTITATIVE: D-Dimer, Quant: 2.82 ug/mL-FEU — ABNORMAL HIGH (ref 0.00–0.50)

## 2019-05-01 LAB — C-REACTIVE PROTEIN: CRP: 15 mg/dL — ABNORMAL HIGH

## 2019-05-01 LAB — MAGNESIUM: Magnesium: 2.4 mg/dL (ref 1.7–2.4)

## 2019-05-01 MED ORDER — LEVETIRACETAM IN NACL 500 MG/100ML IV SOLN
500.0000 mg | Freq: Two times a day (BID) | INTRAVENOUS | Status: DC
  Administered 2019-05-01 – 2019-05-06 (×10): 500 mg via INTRAVENOUS
  Filled 2019-05-01 (×13): qty 100

## 2019-05-01 MED ORDER — LEVETIRACETAM 500 MG PO TABS
500.0000 mg | ORAL_TABLET | Freq: Two times a day (BID) | ORAL | Status: DC
  Filled 2019-05-01: qty 1

## 2019-05-01 MED ORDER — POTASSIUM CHLORIDE 10 MEQ/100ML IV SOLN
10.0000 meq | INTRAVENOUS | Status: AC
  Administered 2019-05-01 (×4): 10 meq via INTRAVENOUS
  Filled 2019-05-01 (×4): qty 100

## 2019-05-01 MED ORDER — LEVETIRACETAM 500 MG PO TABS: 500.0000 mg | ORAL_TABLET | Freq: Two times a day (BID) | ORAL | Status: DC

## 2019-05-01 NOTE — Plan of Care (Signed)
  Problem: Education: Goal: Knowledge of risk factors and measures for prevention of condition will improve Outcome: Progressing   Problem: Coping: Goal: Psychosocial and spiritual needs will be supported Outcome: Progressing   Problem: Respiratory: Goal: Will maintain a patent airway Outcome: Progressing Goal: Complications related to the disease process, condition or treatment will be avoided or minimized Outcome: Progressing   

## 2019-05-01 NOTE — Progress Notes (Signed)
Pt given Tylenol x2 per ok from provider for temperature greater than 102. Pt had been drowsy and only responsive to pain all shift and also yesterday. However tonight, patient just aroused for the first time for me and was able to only tell me his name. He allowed me to give him a full cup of ice water and a full cup of apple juice. Attempted to give applesauce but patient spit out and said no. Pt is in high fowlers with bed alarm set. Will continue to monitor.

## 2019-05-01 NOTE — Progress Notes (Signed)
Jeremy Parks  [id number] DOB: 11-Sep-1950 DOA: 04/26/2019 PCP: Sinda Du, MD    Brief Narrative:  68 year old SNF resident with a history of DM, seizure disorder, cerebral palsy, and HTN who was originally seen in the ED 12/18 with slurred speech which resolved.  Following this initial visit he required multiple repeat visits to the ED with waxing and waning confusion and lethargy.  On 12/22 his Covid test was positive.  He returned to the ED the night of 12/23 with worsening somnolence and was found to have a temperature of 101.3.  CXR noted patchy opacity at the left lung base.  He was admitted.  Significant Events: 12/18 ED visit with slurred speech 12/22 Covid test positive 12/23 admit via [place]  COVID-19 specific Treatment: Remdesivir 12/24 > 12/28 Actemra 12/25 Decadron 12/24 >  Antimicrobials:  Rocephin 12/24 Cefepime 12/25 Zosyn 12/26 >  Subjective: Does not appear uncomfortable. Not able to answer questions/provide a ROS. No evidence of uncontrolled pain or resp distress.   Assessment & Plan:  COVID Pneumonia -acute hypoxic respiratory failure Was dosed with Actemra - remains on steroid and remdesivir - has been weaned to RA   Recent Labs  Lab 04/27/19 0609 04/27/19 1719 04/28/19 0015 04/29/19 0030 04/30/19 0725 05/01/19 0500  DDIMER 3.56*  --  7.44* 8.54* 4.38* 2.82*  FERRITIN 307  --  347* 331 310 298  CRP 19.6*  --  19.5* 28.1* 22.6* 15.0*  ALT 52*  --  58* 61* 53* 49*  PROCALCITON  --  5.22  --  12.05 4.48  --     Aspiration pneumonia Being dosed with Zosyn - thin liquids per SLP recommendation - fever curve trending downward   Nausea/vomiting No evidence of this as an ongoing problem  Acute kidney injury improviing with volume resuscitation  Hypokalemia  Supplement and follow - Mg is normal   Acute urinary retention Foley catheter placed  Proteus UTI Greater than 100,000 colonies in urine culture dated 12/24 - pan-sensisitve per  culture data   DM2 CBG reasonably controlled - follow   Acute delirium - altered mental status  Hypernatremia Cont free water admin and follow   Transaminitis Improving - likely due to Covid plus/minus remdesivir dosing -follow  Normocytic anemia Check anemia panel   Seizure disorder Cont usual meds   HTN BP reasonably controlled   HLD Continue statin   DVT prophylaxis: Lovenox Code Status: FULL CODE Family Communication:  Disposition Plan: med/surg bed   Consultants:  none  Objective: Blood pressure 140/72, pulse 88, temperature 97.7 F (36.5 C), temperature source Oral, resp. rate 18, height 5\' 8"  (1.727 m), weight 82 kg, SpO2 100 %.  Intake/Output Summary (Last 24 hours) at 05/01/2019 0817 Last data filed at 05/01/2019 0800 Gross per 24 hour  Intake 240 ml  Output 1175 ml  Net -935 ml   Filed Weights   04/26/19 1925  Weight: 82 kg    Examination: General: No acute respiratory distress Lungs: fine crackles B bases  Cardiovascular: Regular rate and rhythm without murmur gallop or rub normal S1 and S2 Abdomen: Nontender, nondistended, soft, bowel sounds positive, no rebound, no ascites, no appreciable mass Extremities: No significant cyanosis, clubbing, or edema bilateral lower extremities  CBC: Recent Labs  Lab 04/29/19 0030 04/30/19 0725 05/01/19 0500  WBC 10.5 7.8 6.5  NEUTROABS 8.7* 6.4 4.9  HGB 10.7* 10.2* 9.5*  HCT 34.3* 32.4* 30.4*  MCV 91.5 93.4 91.3  PLT 212 241 248   Basic  Metabolic Panel: Recent Labs  Lab 04/29/19 0030 04/30/19 0725 05/01/19 0500  NA 149* 149* 146*  K 3.7 4.4 3.2*  CL 115* 116* 115*  CO2 25 23 24   GLUCOSE 122* 213* 202*  BUN 39* 31* 18  CREATININE 1.56* 1.35* 1.29*  CALCIUM 7.5* 7.5* 7.4*  MG 2.3 2.5* 2.4  PHOS 3.0 1.6* 1.3*   GFR: Estimated Creatinine Clearance: 53.8 mL/min (A) (by C-G formula based on SCr of 1.29 mg/dL (H)).  Liver Function Tests: Recent Labs  Lab 04/28/19 0015 04/29/19 0030  04/30/19 0725 05/01/19 0500  AST 161* 143* 88* 70*  ALT 58* 61* 53* 49*  ALKPHOS 69 55 54 50  BILITOT 0.9 1.3* 1.0 1.0  PROT 7.4 6.7 6.5 6.2*  ALBUMIN 3.4* 2.9* 2.7* 2.5*    HbA1C: Hgb A1c MFr Bld  Date/Time Value Ref Range Status  04/27/2019 06:09 AM 5.3 4.8 - 5.6 % Final    Comment:    (NOTE) Pre diabetes:          5.7%-6.4% Diabetes:              >6.4% Glycemic control for   <7.0% adults with diabetes   10/29/2018 04:53 AM 5.6 4.8 - 5.6 % Final    Comment:    (NOTE) Pre diabetes:          5.7%-6.4% Diabetes:              >6.4% Glycemic control for   <7.0% adults with diabetes     CBG: Recent Labs  Lab 04/29/19 2039 04/30/19 0744 04/30/19 1052 04/30/19 1559 04/30/19 2004  GLUCAP 177* 214* 207* 144* 147*    Recent Results (from the past 240 hour(s))  Urine culture     Status: Abnormal   Collection Time: 04/27/19  1:02 AM   Specimen: Urine, Random  Result Value Ref Range Status   Specimen Description   Final    URINE, RANDOM Performed at [facility], [address] [place], [place] [address]    Special Requests   Final    NONE Performed at [facility], [address], [place], [place] [address]    Culture >=100,000 COLONIES/mL PROTEUS MIRABILIS (A)  Final   Report Status 04/29/2019 FINAL  Final   Organism ID, Bacteria PROTEUS MIRABILIS (A)  Final      Susceptibility   Proteus mirabilis - MIC*    AMPICILLIN <=2 SENSITIVE Sensitive     CEFAZOLIN <=4 SENSITIVE Sensitive     CEFTRIAXONE <=1 SENSITIVE Sensitive     CIPROFLOXACIN <=0.25 SENSITIVE Sensitive     GENTAMICIN <=1 SENSITIVE Sensitive     IMIPENEM 2 SENSITIVE Sensitive     NITROFURANTOIN 128 RESISTANT Resistant     TRIMETH/SULFA <=20 SENSITIVE Sensitive     AMPICILLIN/SULBACTAM <=2 SENSITIVE Sensitive     PIP/TAZO <=4 SENSITIVE Sensitive     * >=100,000 COLONIES/mL PROTEUS MIRABILIS  Culture, blood (routine x 2)     Status: None (Preliminary result)   Collection Time: 04/28/19 12:30 PM    Specimen: BLOOD  Result Value Ref Range Status   Specimen Description   Final    BLOOD RIGHT ANTECUBITAL Performed at [facility], [place] [address] [place], [place] [address]    Special Requests   Final    BOTTLES DRAWN AEROBIC ONLY Blood Culture adequate volume Performed at [facility] [address], [place],  [address]    Culture   Final    NO GROWTH 2 DAYS  Performed at [facility], [place] [address] [place], [place] [address]    Report Status PENDING  Incomplete  Culture, blood (routine x 2)     Status: None (Preliminary result)   Collection Time: 04/28/19 12:35 PM   Specimen: BLOOD  Result Value Ref Range Status   Specimen Description   Final    BLOOD RIGHT HAND Performed at [facility] [address], [place], [place]  [address]    Special Requests   Final    BOTTLES DRAWN AEROBIC ONLY Blood Culture adequate volume Performed at [facility] [address], [place], [place] [address]    Culture   Final    NO GROWTH 2 DAYS Performed at [facility] [address], [place], [place] [address]    Report Status PENDING  Incomplete     Scheduled Meds: . albuterol  2 puff Inhalation Q6H  . vitamin C  500 mg Oral Daily  . atorvastatin  40 mg Oral QHS  . Chlorhexidine Gluconate Cloth  6 each Topical Daily  . dexamethasone (DECADRON) injection  6 mg Intravenous Q0600  . dextromethorphan-guaiFENesin  1 tablet Oral BID  . docusate sodium  100 mg Oral BID  . enoxaparin (LOVENOX) injection  40 mg Subcutaneous Q12H  . insulin aspart  0-5 Units Subcutaneous QHS  . insulin aspart  0-9 Units Subcutaneous TID WC  . zinc sulfate  220 mg Oral Daily   Continuous Infusions: . dextrose 75 mL/hr at 04/30/19 1834  . levETIRAcetam 500 mg (04/30/19 2042)  . piperacillin-tazobactam (ZOSYN)  IV 3.375 g (05/01/19 0110)  . remdesivir 100 mg in NS 100 mL 100 mg (04/30/19 1057)     LOS: 4 days     Cherene Altes, MD [facility] Office  [phone] Pager - Text Page per Amion  If 7PM-7AM, please contact night-coverage per Amion 05/01/2019, 8:17 AM

## 2019-05-01 NOTE — Plan of Care (Signed)
Went over Muse with pt. Pt oriented to person only but nods understanding.

## 2019-05-02 LAB — FOLATE: Folate: 11.7 ng/mL (ref >5.9)

## 2019-05-02 LAB — FERRITIN: Ferritin: 248 ng/mL (ref 24–336)

## 2019-05-02 LAB — C-REACTIVE PROTEIN: CRP: 12.7 mg/dL — ABNORMAL HIGH (ref <1.0)

## 2019-05-02 LAB — COMPREHENSIVE METABOLIC PANEL
ALT: 42 U/L (ref 0–44)
AST: 50 U/L — ABNORMAL HIGH (ref 15–41)
Albumin: 2.4 g/dL — ABNORMAL LOW (ref 3.5–5.0)
Alkaline Phosphatase: 48 U/L (ref 38–126)
Anion gap: 6 (ref 5–15)
BUN: 15 mg/dL (ref 8–23)
CO2: 23 mmol/L (ref 22–32)
Calcium: 7.5 mg/dL — ABNORMAL LOW (ref 8.9–10.3)
Chloride: 111 mmol/L (ref 98–111)
Creatinine, Ser: 1.18 mg/dL (ref 0.61–1.24)
GFR calc Af Amer: >60 mL/min (ref >60)
GFR calc non Af Amer: >60 mL/min (ref >60)
Glucose, Bld: 185 mg/dL — ABNORMAL HIGH (ref 70–99)
Potassium: 3.5 mmol/L (ref 3.5–5.1)
Sodium: 140 mmol/L (ref 135–145)
Total Bilirubin: 1.3 mg/dL — ABNORMAL HIGH (ref 0.3–1.2)
Total Protein: 6 g/dL — ABNORMAL LOW (ref 6.5–8.1)

## 2019-05-02 LAB — RETICULOCYTES
Immature Retic Fract: 20.1 % — ABNORMAL HIGH (ref 2.3–15.9)
RBC.: 3.28 MIL/uL — ABNORMAL LOW (ref 4.22–5.81)
Retic Count, Absolute: 17.7 10*3/uL — ABNORMAL LOW (ref 19.0–186.0)
Retic Ct Pct: 0.5 % (ref 0.4–3.1)

## 2019-05-02 LAB — CBC
HCT: 29.7 % — ABNORMAL LOW (ref 39.0–52.0)
Hemoglobin: 9.4 g/dL — ABNORMAL LOW (ref 13.0–17.0)
MCH: 28.5 pg (ref 26.0–34.0)
MCHC: 31.6 g/dL (ref 30.0–36.0)
MCV: 90 fL (ref 80.0–100.0)
Platelets: 273 10*3/uL (ref 150–400)
RBC: 3.3 MIL/uL — ABNORMAL LOW (ref 4.22–5.81)
RDW: 12.5 % (ref 11.5–15.5)
WBC: 7.3 10*3/uL (ref 4.0–10.5)
nRBC: 0.3 % — ABNORMAL HIGH (ref 0.0–0.2)

## 2019-05-02 LAB — VITAMIN B12: Vitamin B-12: 1411 pg/mL — ABNORMAL HIGH (ref 180–914)

## 2019-05-02 LAB — IRON AND TIBC
Iron: 33 ug/dL — ABNORMAL LOW (ref 45–182)
Saturation Ratios: 23 % (ref 17.9–39.5)
TIBC: 144 ug/dL — ABNORMAL LOW (ref 250–450)
UIBC: 111 ug/dL

## 2019-05-02 LAB — GLUCOSE, CAPILLARY
Glucose-Capillary: 172 mg/dL — ABNORMAL HIGH (ref 70–99)
Glucose-Capillary: 152 mg/dL — ABNORMAL HIGH (ref 70–99)
Glucose-Capillary: 163 mg/dL — ABNORMAL HIGH (ref 70–99)
Glucose-Capillary: 131 mg/dL — ABNORMAL HIGH (ref 70–99)

## 2019-05-02 LAB — D-DIMER, QUANTITATIVE: D-Dimer, Quant: 2.19 ug/mL-FEU — ABNORMAL HIGH (ref 0.00–0.50)

## 2019-05-02 MED ORDER — POTASSIUM CHLORIDE 10 MEQ/100ML IV SOLN
10.0000 meq | INTRAVENOUS | Status: AC
  Administered 2019-05-02 (×3): 10 meq via INTRAVENOUS
  Filled 2019-05-02 (×3): qty 100

## 2019-05-02 MED ORDER — BETHANECHOL CHLORIDE 10 MG PO TABS
10.0000 mg | ORAL_TABLET | Freq: Four times a day (QID) | ORAL | Status: DC
  Administered 2019-05-02 – 2019-05-05 (×4): 10 mg via ORAL
  Filled 2019-05-02 (×17): qty 1

## 2019-05-02 MED ORDER — ENOXAPARIN SODIUM 40 MG/0.4ML ~~LOC~~ SOLN
40.0000 mg | SUBCUTANEOUS | Status: DC
  Administered 2019-05-03 – 2019-05-09 (×7): 40 mg via SUBCUTANEOUS
  Filled 2019-05-02 (×7): qty 0.4

## 2019-05-02 MED ORDER — ACETAMINOPHEN 325 MG PO TABS
650.0000 mg | ORAL_TABLET | Freq: Four times a day (QID) | ORAL | Status: DC | PRN
  Administered 2019-05-02: 650 mg via ORAL
  Filled 2019-05-02: qty 2

## 2019-05-02 MED ORDER — ACETAMINOPHEN 650 MG RE SUPP
650.0000 mg | RECTAL | Status: DC | PRN
  Administered 2019-05-03: 650 mg via RECTAL
  Filled 2019-05-02: qty 1

## 2019-05-02 NOTE — Consult Note (Signed)
Pharmacy Antibiotic Note  Jeremy Parks is a 68 y.o. male admitted on 04/26/2019 with pneumonia.  Pharmacy has been consulted for Zosyn dosing. His SCr is improving  Plan: Continue Zosyn 3.375g IV q8h (4 hour infusion) day 4/5 per MD note  Height: 5\' 8"  (172.7 cm) Weight: 180 lb 12.4 oz (82 kg) IBW/kg (Calculated) : 68.4  Temp (24hrs), Avg:100.5 F (38.1 C), Min:99.7 F (37.6 C), Max:101.2 F (38.4 C)  Recent Labs  Lab 04/26/19 2132 04/28/19 0015 04/29/19 0030 04/30/19 0725 05/01/19 0500 05/02/19 0527  WBC 5.5 8.1 10.5 7.8 6.5 7.3  CREATININE 1.36* 1.38* 1.56* 1.35* 1.29* 1.18  LATICACIDVEN 1.5  --   --   --   --   --     Estimated Creatinine Clearance: 58.8 mL/min (by C-G formula based on SCr of 1.18 mg/dL).    No Known Allergies  Antimicrobials this admission: remdesivir 12/24 >> 12/29 Ceftriaxone 12/23 x 1 Cefepime 12/25 >>12/26 Zosyn 12/26 >>   Microbiology results: 12/25 BCx: NGTD 12/24 UCx: P mirabilis   Thank you for allowing pharmacy to be a part of this patient's care.  Ulice Dash D 05/02/2019 12:00 PM

## 2019-05-02 NOTE — Plan of Care (Signed)
  Problem: Education: Goal: Knowledge of risk factors and measures for prevention of condition will improve Outcome: Progressing   Problem: Coping: Goal: Psychosocial and spiritual needs will be supported Outcome: Progressing   Problem: Respiratory: Goal: Will maintain a patent airway Outcome: Progressing Goal: Complications related to the disease process, condition or treatment will be avoided or minimized Outcome: Progressing   

## 2019-05-02 NOTE — Progress Notes (Signed)
   05/02/19 0728  Family/Significant Other Communication  Family/Significant Other Update Called;Updated  All questions asked and all questions answered.

## 2019-05-02 NOTE — Progress Notes (Signed)
Jeremy Parks  [id number] DOB: October 09, 1950 DOA: 04/26/2019 PCP: Sinda Du, MD    Brief Narrative:  68 year old SNF resident with a history of DM, seizure disorder, cerebral palsy, and HTN who was originally seen in the ED 12/18 with slurred speech which resolved.  Following this initial visit he required multiple repeat visits to the ED with waxing and waning confusion and lethargy.  On 12/22 his Covid test was positive.  He returned to the ED the night of 12/23 with worsening somnolence and was found to have a temperature of 101.3.  CXR noted patchy opacity at the left lung base.  He was admitted.  Significant Events: 12/18 ED visit with slurred speech 12/22 Covid test positive 12/23 admit via [facility] - transfer to [facility]: [place] [facility]   COVID-19 specific Treatment: Remdesivir 12/24 > 12/28 Actemra 12/25 Decadron 12/24 > 05/05/2018  Antimicrobials:  Rocephin 12/24 Cefepime 12/25 Zosyn 12/26 > 12/30  Subjective: The patient is noncommunicative.  He does not appear to be in distress.  He appears to be resting comfortably.  Assessment & Plan:  COVID Pneumonia -acute hypoxic respiratory failure Was dosed with Actemra and remdesivir - remains on steroid - has been weaned to RA   Recent Labs  Lab 04/27/19 1719 04/28/19 0015 04/29/19 0030 04/30/19 0725 05/01/19 0500 05/02/19 0526 05/02/19 0527  DDIMER  --  7.44* 8.54* 4.38* 2.82*  --  2.19*  FERRITIN  --  347* 331 310 298 248  --   CRP  --  19.5* 28.1* 22.6* 15.0* 12.7*  --   ALT  --  58* 61* 53* 49*  --  42  PROCALCITON 5.22  --  12.05 4.48  --   --   --     Aspiration pneumonia Being dosed with Zosyn - thin liquids per SLP recommendation - fever curve trending downward - clinically much improved - stop abx after 5 days of tx   Nausea/vomiting No evidence of this as an ongoing problem  Acute kidney injury improviing with volume resuscitation  Hypokalemia  Supplement and follow - Mg is normal    Acute urinary retention Foley catheter placed - add urecholine today and remove foley this afternoon   Proteus UTI Greater than 100,000 colonies in urine culture dated 12/24 - pan-sensisitve per culture data - should be covered by current abx choice   DM2 CBG reasonably controlled - follow   Acute delirium - altered mental status Unclear baseline - no change in mental status today  Hypernatremia Corrected w/ free water admin   Transaminitis Improving - likely due to Covid plus/minus remdesivir dosing -follow  Normocytic anemia anemia panel consistent with anemia of chronic disease/poor nutrition  Seizure disorder Cont usual meds   HTN BP reasonably controlled   HLD Continue statin  DVT prophylaxis: Lovenox Code Status: FULL CODE Family Communication:  Disposition Plan: med/surg bed   Consultants:  none  Objective: Blood pressure 132/67, pulse 72, temperature (!) 100.7 F (38.2 C), temperature source Axillary, resp. rate 18, height 5\' 8"  (1.727 m), weight 82 kg, SpO2 95 %.  Intake/Output Summary (Last 24 hours) at 05/02/2019 0945 Last data filed at 05/01/2019 1800 Gross per 24 hour  Intake 2217.72 ml  Output 590 ml  Net 1627.72 ml   Filed Weights   04/26/19 1925  Weight: 82 kg    Examination: General: No acute respiratory distress evident  Lungs: CTA - no wheezing  Cardiovascular: RRR Abdomen: NT/ND, soft, bs+ Extremities: No C/C/E  B LE   CBC: Recent Labs  Lab 04/29/19 0030 04/30/19 0725 05/01/19 0500 05/02/19 0527  WBC 10.5 7.8 6.5 7.3  NEUTROABS 8.7* 6.4 4.9  --   HGB 10.7* 10.2* 9.5* 9.4*  HCT 34.3* 32.4* 30.4* 29.7*  MCV 91.5 93.4 91.3 90.0  PLT 212 241 248 [id number]   Basic Metabolic Panel: Recent Labs  Lab 04/29/19 0030 04/30/19 0725 05/01/19 0500 05/02/19 0527  NA 149* 149* 146* 140  K 3.7 4.4 3.2* 3.5  CL 115* 116* 115* 111  CO2 25 23 24 23   GLUCOSE 122* 213* 202* 185*  BUN 39* 31* 18 15  CREATININE 1.56* 1.35* 1.29* 1.18   CALCIUM 7.5* 7.5* 7.4* 7.5*  MG 2.3 2.5* 2.4  --   PHOS 3.0 1.6* 1.3*  --    GFR: Estimated Creatinine Clearance: 58.8 mL/min (by C-G formula based on SCr of 1.18 mg/dL).  Liver Function Tests: Recent Labs  Lab 04/29/19 0030 04/30/19 0725 05/01/19 0500 05/02/19 0527  AST 143* 88* 70* 50*  ALT 61* 53* 49* 42  ALKPHOS 55 54 50 48  BILITOT 1.3* 1.0 1.0 1.3*  PROT 6.7 6.5 6.2* 6.0*  ALBUMIN 2.9* 2.7* 2.5* 2.4*    HbA1C: Hgb A1c MFr Bld  Date/Time Value Ref Range Status  04/27/2019 06:09 AM 5.3 4.8 - 5.6 % Final    Comment:    (NOTE) Pre diabetes:          5.7%-6.4% Diabetes:              >6.4% Glycemic control for   <7.0% adults with diabetes   10/29/2018 04:53 AM 5.6 4.8 - 5.6 % Final    Comment:    (NOTE) Pre diabetes:          5.7%-6.4% Diabetes:              >6.4% Glycemic control for   <7.0% adults with diabetes     CBG: Recent Labs  Lab 05/01/19 0811 05/01/19 1329 05/01/19 1533 05/01/19 1931 05/02/19 0751  GLUCAP 167* 165* 164* 168* 172*    Recent Results (from the past 240 hour(s))  Urine culture     Status: Abnormal   Collection Time: 04/27/19  1:02 AM   Specimen: Urine, Random  Result Value Ref Range Status   Specimen Description   Final    URINE, RANDOM Performed at [facility], [address] [place], [place] [address]    Special Requests   Final    NONE Performed at [facility], [address] [place], [place] [address]    Culture >=100,000 COLONIES/mL PROTEUS MIRABILIS (A)  Final   Report Status 04/29/2019 FINAL  Final   Organism ID, Bacteria PROTEUS MIRABILIS (A)  Final      Susceptibility   Proteus mirabilis - MIC*    AMPICILLIN <=2 SENSITIVE Sensitive     CEFAZOLIN <=4 SENSITIVE Sensitive     CEFTRIAXONE <=1 SENSITIVE Sensitive     CIPROFLOXACIN <=0.25 SENSITIVE Sensitive     GENTAMICIN <=1 SENSITIVE Sensitive     IMIPENEM 2 SENSITIVE Sensitive     NITROFURANTOIN 128 RESISTANT Resistant     TRIMETH/SULFA <=20 SENSITIVE  Sensitive     AMPICILLIN/SULBACTAM <=2 SENSITIVE Sensitive     PIP/TAZO <=4 SENSITIVE Sensitive     * >=100,000 COLONIES/mL PROTEUS MIRABILIS  Culture, blood (routine x 2)     Status: None (Preliminary result)   Collection Time: 04/28/19 12:30 PM   Specimen: BLOOD  Result Value Ref Range Status  Specimen Description   Final    BLOOD RIGHT ANTECUBITAL Performed at [facility] [address], [place], [place] [address]    Special Requests   Final    BOTTLES DRAWN AEROBIC ONLY Blood Culture adequate volume Performed at [facility] [address], [place], [place] [address]    Culture   Final    NO GROWTH 3 DAYS Performed at [facility], [place] [address] [place], [place] [address]    Report Status PENDING  Incomplete  Culture, blood (routine x 2)     Status: None (Preliminary result)   Collection Time: 04/28/19 12:35 PM   Specimen: BLOOD  Result Value Ref Range Status   Specimen Description   Final    BLOOD RIGHT HAND Performed at [facility] [address], [place], [place] [address]    Special Requests   Final    BOTTLES DRAWN AEROBIC ONLY Blood Culture adequate volume Performed at [facility] [address], [place], [place] [address]    Culture   Final    NO GROWTH 3 DAYS Performed at [facility], [place] [address] [place], [place] [address]    Report Status PENDING  Incomplete     Scheduled Meds: . vitamin C  500 mg Oral Daily  . atorvastatin  40 mg Oral QHS  . Chlorhexidine Gluconate Cloth  6 each Topical Daily  . dexamethasone (DECADRON) injection  6 mg Intravenous Q0600  . dextromethorphan-guaiFENesin  1 tablet Oral BID  . docusate sodium  100 mg Oral BID  . enoxaparin (LOVENOX) injection  40 mg Subcutaneous Q12H  . insulin aspart  0-5 Units Subcutaneous QHS  . insulin aspart  0-9 Units Subcutaneous TID WC  . levETIRAcetam  500 mg Oral Q12H  . zinc sulfate  220 mg  Oral Daily   Continuous Infusions: . dextrose 75 mL/hr at 05/01/19 2152  . levETIRAcetam 500 mg (05/02/19 0831)  . piperacillin-tazobactam (ZOSYN)  IV 3.375 g (05/02/19 0508)     LOS: 5 days   Cherene Altes, MD [facility] Office  [phone] Pager - Text Page per Amion  If 7PM-7AM, please contact night-coverage per Amion 05/02/2019, 9:45 AM

## 2019-05-02 NOTE — Plan of Care (Signed)
Went over [place] with pt. Pt oriented to self and right now is nonverbal but nods understanding.

## 2019-05-02 NOTE — Progress Notes (Addendum)
  Speech Language Pathology Treatment: Dysphagia  Patient Details Name: Jeremy Parks MRN: [id number] DOB: [birth year] Today's Date: 05/02/2019 Time: [id number] SLP Time Calculation (min) (ACUTE ONLY): 15 min  Assessment / Plan / Recommendation Clinical Impression  Pt seen for dysphagia goal to progress texture and safety and efficiency with full liquids. He was lethargic, sleeping but arousable. Lips closed as spoon approached but managed to get minimal amount pudding with delayed transit x 1. Accepted one small ice chip. He yawned which revealed moderate amount of lingual residue with appearance of crushed meds mixed with chocolate pudding. SLP able to suction residue but not thoroughly cleaned due to pt's resistance. Session limited re: safest most efficient recommendations. He currently is febrile which may be impacting ability to participate. Do not recommend upgrade to puree currently as suspect he would hold majority of bolus. He would not accept thin this session. He may be more interactive mid mornings. Continue full liquids for now.    HPI HPI: 68 year old male with past medical history of diabetes mellitus, seizure disorder, cerebral palsy and hypertension who resides in a local nursing home who had been seen in the emergency room on 12/18 for slurred speech which had resolved by the time the patient was in the emergency room itself.  He was sent back with no other findings.  However, he has since been to the emergency room several days for confusion or somnolence.  On 12/22 he had a Covid test which came back positive.      SLP Plan  Continue with current plan of care       Recommendations  Diet recommendations: Other(comment);Thin liquid(full liquids) Liquids provided via: Cup;Straw(straw if able) Medication Administration: Crushed with puree Supervision: Full supervision/cueing for compensatory strategies;Staff to assist with self feeding Compensations: Slow rate;Small  sips/bites;Lingual sweep for clearance of pocketing Postural Changes and/or Swallow Maneuvers: Seated upright 90 degrees;Upright 30-60 min after meal                Oral Care Recommendations: Oral care BID Follow up Recommendations: Skilled Nursing facility SLP Visit Diagnosis: Dysphagia, unspecified (R13.10) Plan: Continue with current plan of care       GO                Houston Siren 05/02/2019, 3:26 PM  Orbie Pyo Colvin Caroli.Ed Risk analyst [phone] Office [phone]

## 2019-05-03 ENCOUNTER — Inpatient Hospital Stay (HOSPITAL_COMMUNITY): Payer: Medicare Other

## 2019-05-03 LAB — CULTURE, BLOOD (ROUTINE X 2)
Culture: NO GROWTH
Culture: NO GROWTH
Special Requests: ADEQUATE
Special Requests: ADEQUATE

## 2019-05-03 LAB — CBC
HCT: 32.3 % — ABNORMAL LOW (ref 39.0–52.0)
Hemoglobin: 10.7 g/dL — ABNORMAL LOW (ref 13.0–17.0)
MCH: 29.2 pg (ref 26.0–34.0)
MCHC: 33.1 g/dL (ref 30.0–36.0)
MCV: 88.3 fL (ref 80.0–100.0)
Platelets: 300 10*3/uL (ref 150–400)
RBC: 3.66 MIL/uL — ABNORMAL LOW (ref 4.22–5.81)
RDW: 12.4 % (ref 11.5–15.5)
WBC: 12.5 10*3/uL — ABNORMAL HIGH (ref 4.0–10.5)
nRBC: 0 % (ref 0.0–0.2)

## 2019-05-03 LAB — COMPREHENSIVE METABOLIC PANEL
ALT: 46 U/L — ABNORMAL HIGH (ref 0–44)
AST: 48 U/L — ABNORMAL HIGH (ref 15–41)
Albumin: 2.5 g/dL — ABNORMAL LOW (ref 3.5–5.0)
Alkaline Phosphatase: 58 U/L (ref 38–126)
Anion gap: 11 (ref 5–15)
BUN: 15 mg/dL (ref 8–23)
CO2: 20 mmol/L — ABNORMAL LOW (ref 22–32)
Calcium: 7.5 mg/dL — ABNORMAL LOW (ref 8.9–10.3)
Chloride: 107 mmol/L (ref 98–111)
Creatinine, Ser: 1.17 mg/dL (ref 0.61–1.24)
GFR calc Af Amer: >60 mL/min (ref >60)
GFR calc non Af Amer: >60 mL/min (ref >60)
Glucose, Bld: 160 mg/dL — ABNORMAL HIGH (ref 70–99)
Potassium: 3.7 mmol/L (ref 3.5–5.1)
Sodium: 138 mmol/L (ref 135–145)
Total Bilirubin: 1.5 mg/dL — ABNORMAL HIGH (ref 0.3–1.2)
Total Protein: 6.4 g/dL — ABNORMAL LOW (ref 6.5–8.1)

## 2019-05-03 LAB — MRSA PCR SCREENING: MRSA by PCR: POSITIVE — AB

## 2019-05-03 LAB — GLUCOSE, CAPILLARY
Glucose-Capillary: 103 mg/dL — ABNORMAL HIGH (ref 70–99)
Glucose-Capillary: 138 mg/dL — ABNORMAL HIGH (ref 70–99)
Glucose-Capillary: 141 mg/dL — ABNORMAL HIGH (ref 70–99)
Glucose-Capillary: 136 mg/dL — ABNORMAL HIGH (ref 70–99)
Glucose-Capillary: 103 mg/dL — ABNORMAL HIGH (ref 70–99)

## 2019-05-03 LAB — MAGNESIUM: Magnesium: 2 mg/dL (ref 1.7–2.4)

## 2019-05-03 MED ORDER — VANCOMYCIN HCL 1500 MG/300ML IV SOLN
1500.0000 mg | INTRAVENOUS | Status: AC
  Administered 2019-05-04 – 2019-05-07 (×4): 1500 mg via INTRAVENOUS
  Filled 2019-05-03 (×4): qty 300

## 2019-05-03 MED ORDER — SODIUM CHLORIDE 0.9 % IV SOLN: INTRAVENOUS | Status: AC

## 2019-05-03 MED ORDER — VANCOMYCIN HCL 1750 MG/350ML IV SOLN
1750.0000 mg | Freq: Once | INTRAVENOUS | Status: AC
  Administered 2019-05-03: 1750 mg via INTRAVENOUS
  Filled 2019-05-03 (×2): qty 350

## 2019-05-03 MED ORDER — SODIUM CHLORIDE 0.9 % IV BOLUS: 1000.0000 mL | Freq: Once | INTRAVENOUS | Status: DC

## 2019-05-03 MED ORDER — MUPIROCIN 2 % EX OINT
TOPICAL_OINTMENT | Freq: Two times a day (BID) | CUTANEOUS | Status: DC
  Filled 2019-05-03: qty 22

## 2019-05-03 NOTE — Progress Notes (Signed)
Jeremy Parks  [id number] DOB: 26-Apr-1951 DOA: 04/26/2019 PCP: Sinda Du, MD    Brief Narrative:  68 year old SNF resident with a history of DM, seizure disorder, cerebral palsy, and HTN who was originally seen in the ED 12/18 with slurred speech which resolved.  Following this initial visit he required multiple repeat visits to the ED with waxing and waning confusion and lethargy.  On 12/22 his Covid test was positive.  He returned to the ED the night of 12/23 with worsening somnolence and was found to have a temperature of 101.3.  CXR noted patchy opacity at the left lung base.  He was admitted.  Significant Events: 12/18 ED visit with slurred speech 12/22 Covid test positive 12/23 admit via [facility] - transfer to [facility] 12/30 episode of   Devices: RUE PICC Foley   COVID-19 specific Treatment: Remdesivir 12/24 > 12/28 Actemra 12/25 Decadron 12/24 > 05/05/2018  Antimicrobials:  Rocephin 12/24 Cefepime 12/25 Zosyn 12/26 > 12/30  Subjective: The patient had a fever of 103.3 at 8:52 this morning. He also reportedly had an episode of vomiting. Shortly thereafter he became tachypneic, tachycardic, and began to experience hypotension.   At the time of my visit he is arousable, but lethargic. This is not very different that his baseline to my experience. He is somewhat tachypneic, and he is hypotensive. He is responding to a NS bolus already.   Assessment & Plan:  Suspected recurrent aspiration event - Sepsis  Volume resuscitate - follow BP - add vanc to tx regimen - upgrade to progressive care - cont Zosyn - repeat blood cx given recurrent fever while on Zosyn   COVID Pneumonia -acute hypoxic respiratory failure was dosed with Actemra and remdesivir - remains on steroid - has been weaned to RA - this does not appear to have been the primary issue driving his presentation - f/u CXR today w/o signif change in his pulm infiltrates   Recent Labs  Lab 04/27/19 1719  04/28/19 0015 04/29/19 0030 04/30/19 0725 05/01/19 0500 05/02/19 0526 05/02/19 0527 05/03/19 0455  DDIMER  --  7.44* 8.54* 4.38* 2.82*  --  2.19*  --   FERRITIN  --  347* 331 310 298 248  --   --   CRP  --  19.5* 28.1* 22.6* 15.0* 12.7*  --   --   ALT  --  58* 61* 53* 49*  --  42 46*  PROCALCITON 5.22  --  12.05 4.48  --   --   --   --     Aspiration pneumonia Being dosed with Zosyn - thin liquids per SLP recommendation - see above   Acute kidney injury Has been improving with volume resuscitation  Hypokalemia  Corrected w/ supplementation   Acute urinary retention Foley catheter placed - added urecholine 12/29 and then foley able to be removed - monitor output   Proteus UTI Greater than 100,000 colonies in urine culture dated 12/24 - pan-sensisitve per culture data - should be covered by current abx choice   DM2 CBG controlled - follow   Acute delirium - altered mental status Unclear baseline - no change in mental status today to my exam  Hypernatremia Corrected w/ free water admin   Transaminitis likely due to Covid plus/minus remdesivir dosing - follow  Normocytic anemia anemia panel consistent with anemia of chronic disease/poor nutrition  Seizure disorder Cont usual meds   HTN BP reasonably controlled   HLD Continue statin  DVT prophylaxis: Lovenox Code Status:  FULL CODE Family Communication:  Disposition Plan: progressive care bed    Consultants:  none  Objective: Blood pressure 140/85, pulse (!) 125, temperature (!) 103.3 F (39.6 C), temperature source Axillary, resp. rate 20, height 5\' 8"  (1.727 m), weight 82 kg, SpO2 99 %.  Intake/Output Summary (Last 24 hours) at 05/03/2019 0919 Last data filed at 05/02/2019 1800 Gross per 24 hour  Intake 441.48 ml  Output 1500 ml  Net -1058.52 ml   Filed Weights   04/26/19 1925  Weight: 82 kg    Examination: General: some tachpnea improving during time of my exam  Lungs: mild bibasilar  crackles - no wheezing  Cardiovascular: tachycardic - no M or rub  Abdomen: NT/ND, soft, bs+ Extremities: No C/C/E B lower extremities   CBC: Recent Labs  Lab 04/29/19 0030 04/30/19 0725 05/01/19 0500 05/02/19 0527 05/03/19 0455  WBC 10.5 7.8 6.5 7.3 12.5*  NEUTROABS 8.7* 6.4 4.9  --   --   HGB 10.7* 10.2* 9.5* 9.4* 10.7*  HCT 34.3* 32.4* 30.4* 29.7* 32.3*  MCV 91.5 93.4 91.3 90.0 88.3  PLT 212 241 248 273 [id number]   Basic Metabolic Panel: Recent Labs  Lab 04/29/19 0030 04/30/19 0725 05/01/19 0500 05/02/19 0527 05/03/19 0455  NA 149* 149* 146* 140 138  K 3.7 4.4 3.2* 3.5 3.7  CL 115* 116* 115* 111 107  CO2 25 23 24 23  20*  GLUCOSE 122* 213* 202* 185* 160*  BUN 39* 31* 18 15 15   CREATININE 1.56* 1.35* 1.29* 1.18 1.17  CALCIUM 7.5* 7.5* 7.4* 7.5* 7.5*  MG 2.3 2.5* 2.4  --  2.0  PHOS 3.0 1.6* 1.3*  --   --    GFR: Estimated Creatinine Clearance: 59.3 mL/min (by C-G formula based on SCr of 1.17 mg/dL).  Liver Function Tests: Recent Labs  Lab 04/30/19 0725 05/01/19 0500 05/02/19 0527 05/03/19 0455  AST 88* 70* 50* 48*  ALT 53* 49* 42 46*  ALKPHOS 54 50 48 58  BILITOT 1.0 1.0 1.3* 1.5*  PROT 6.5 6.2* 6.0* 6.4*  ALBUMIN 2.7* 2.5* 2.4* 2.5*    HbA1C: Hgb A1c MFr Bld  Date/Time Value Ref Range Status  04/27/2019 06:09 AM 5.3 4.8 - 5.6 % Final    Comment:    (NOTE) Pre diabetes:          5.7%-6.4% Diabetes:              >6.4% Glycemic control for   <7.0% adults with diabetes   10/29/2018 04:53 AM 5.6 4.8 - 5.6 % Final    Comment:    (NOTE) Pre diabetes:          5.7%-6.4% Diabetes:              >6.4% Glycemic control for   <7.0% adults with diabetes     CBG: Recent Labs  Lab 05/02/19 0751 05/02/19 1109 05/02/19 1519 05/02/19 2023 05/03/19 0755  GLUCAP 172* 152* 163* 131* 138*    Recent Results (from the past 240 hour(s))  Urine culture     Status: Abnormal   Collection Time: 04/27/19  1:02 AM   Specimen: Urine, Random  Result Value Ref  Range Status   Specimen Description   Final    URINE, RANDOM Performed at [facility], [address], [place], [place] [address]    Special Requests   Final    NONE Performed at [facility], [address], [place], [place] [address]    Culture >=100,000 COLONIES/mL PROTEUS MIRABILIS (  A)  Final   Report Status 04/29/2019 FINAL  Final   Organism ID, Bacteria PROTEUS MIRABILIS (A)  Final      Susceptibility   Proteus mirabilis - MIC*    AMPICILLIN <=2 SENSITIVE Sensitive     CEFAZOLIN <=4 SENSITIVE Sensitive     CEFTRIAXONE <=1 SENSITIVE Sensitive     CIPROFLOXACIN <=0.25 SENSITIVE Sensitive     GENTAMICIN <=1 SENSITIVE Sensitive     IMIPENEM 2 SENSITIVE Sensitive     NITROFURANTOIN 128 RESISTANT Resistant     TRIMETH/SULFA <=20 SENSITIVE Sensitive     AMPICILLIN/SULBACTAM <=2 SENSITIVE Sensitive     PIP/TAZO <=4 SENSITIVE Sensitive     * >=100,000 COLONIES/mL PROTEUS MIRABILIS  Culture, blood (routine x 2)     Status: None (Preliminary result)   Collection Time: 04/28/19 12:30 PM   Specimen: BLOOD  Result Value Ref Range Status   Specimen Description   Final    BLOOD RIGHT ANTECUBITAL Performed at [facility] [address], [place], [place] [address]    Special Requests   Final    BOTTLES DRAWN AEROBIC ONLY Blood Culture adequate volume Performed at [facility] [address], [place], [place] [address]    Culture   Final    NO GROWTH 4 DAYS Performed at [facility], [place] [address], [place], [place] [address]    Report Status PENDING  Incomplete  Culture, blood (routine x 2)     Status: None (Preliminary result)   Collection Time: 04/28/19 12:35 PM   Specimen: BLOOD  Result Value Ref Range Status   Specimen Description   Final    BLOOD RIGHT HAND Performed at [facility] [address] [place], [place] [address]    Special Requests   Final    BOTTLES DRAWN AEROBIC ONLY Blood Culture adequate  volume Performed at [facility] [address], [place], [place] [address]    Culture   Final    NO GROWTH 4 DAYS Performed at [facility], [place] [address] [place], [place] [address]    Report Status PENDING  Incomplete     Scheduled Meds: . vitamin C  500 mg Oral Daily  . atorvastatin  40 mg Oral QHS  . bethanechol  10 mg Oral QID  . Chlorhexidine Gluconate Cloth  6 each Topical Daily  . dexamethasone (DECADRON) injection  6 mg Intravenous Q0600  . dextromethorphan-guaiFENesin  1 tablet Oral BID  . docusate sodium  100 mg Oral BID  . enoxaparin (LOVENOX) injection  40 mg Subcutaneous Q24H  . insulin aspart  0-5 Units Subcutaneous QHS  . insulin aspart  0-9 Units Subcutaneous TID WC  . levETIRAcetam  500 mg Oral Q12H   Continuous Infusions: . levETIRAcetam 500 mg (05/02/19 2123)  . piperacillin-tazobactam (ZOSYN)  IV 3.375 g (05/03/19 0454)     LOS: 6 days   Cherene Altes, MD [facility] Office  [phone] Pager - Text Page per Amion  If 7PM-7AM, please contact night-coverage per Amion 05/03/2019, 9:19 AM

## 2019-05-03 NOTE — Consult Note (Signed)
Pharmacy Antibiotic Note  Jeremy Parks is a 68 y.o. male admitted on 04/26/2019 with pneumonia.  Pharmacy has been consulted for Zosyn dosing. Patient spiking fevers up to 103.3 12/30 AM. Vancomycin added 12/30.   Plan: -Continue Zosyn 3.375g IV q8h (4 hour infusion), day 5  -Vancomycin 1750 mg iv once followed by vancomycin 1500 mg IV Q 24 hrs. Goal AUC 400-550. Expected AUC: 474 SCr used: 1.17  -F/U culture results, renal function, duration of therapy    Height: 5\' 8"  (172.7 cm) Weight: 180 lb 12.4 oz (82 kg) IBW/kg (Calculated) : 68.4  Temp (24hrs), Avg:101.4 F (38.6 C), Min:99.8 F (37.7 C), Max:103.3 F (39.6 C)  Recent Labs  Lab 04/26/19 2132 04/29/19 0030 04/30/19 0725 05/01/19 0500 05/02/19 0527 05/03/19 0455  WBC 5.5 10.5 7.8 6.5 7.3 12.5*  CREATININE 1.36* 1.56* 1.35* 1.29* 1.18 1.17  LATICACIDVEN 1.5  --   --   --   --   --     Estimated Creatinine Clearance: 59.3 mL/min (by C-G formula based on SCr of 1.17 mg/dL).    No Known Allergies  Antimicrobials this admission: remdesivir 12/24 >> 12/29 Ceftriaxone 12/23 x 1 Cefepime 12/25 >>12/26 Zosyn 12/26 >>  Vancomycin 12/30 >>  Microbiology results: 12/25 BCx: NGTD 12/30 BCx:  12/24 UCx: P mirabilis   Thank you for allowing pharmacy to be a part of this patient's care.  Ulice Dash D 05/03/2019 11:03 AM

## 2019-05-03 NOTE — Consult Note (Signed)
   [facility]   05/03/2019  ISHMAEL BERKOVICH 06-02-1950 [id number]    Patientreviewed for 28% high risk score for unplanned readmission with 4 ED visits in the past 6 months; and to check for[facility] needsunder his Medicare/ [employer] ACO plan.  Review of chart and MD brief narrative shows as:  68 year old SNF (skilled nursing facility) resident with a history of DM, seizure disorder, cerebral palsy, and HTN who was originally seen in the ED 12/18 with slurred speech which resolved. Following this initial visit he required multiple repeat visits to the ED with waxing and waning confusion and lethargy.  On 12/22 his Covid test was positive.    Admitted for COVID Pneumonia -acute hypoxic respiratory failure.  Per chart review, current discharge disposition is toreturn back to nursing home facility. Patient is a resident of a local nursing home ([facility]).  No identifiable [facility] Care Management needs at this time,aspatient's care will be met at the skilled nursing facility.   For questions, please call:  Edwena Felty A. Beau Vanduzer, BSN, RN-BC [facility] Liaison Cell: [phone]

## 2019-05-03 NOTE — Progress Notes (Addendum)
   05/03/19 1015  Vitals  BP (!) 85/39  MAP (mmHg) (!) 52  Pulse Rate 96  ECG Heart Rate 96  Resp (!) 25  Oxygen Therapy  SpO2 99 %  MEWS Score  MEWS RR 1  MEWS Pulse 0  MEWS Systolic 1  MEWS LOC 1  MEWS Temp 2  MEWS Score 5  MEWS Score Color Red  Notified MD mcClung and ICU charge nurse and PCU charge nurse. MD ICU nurse to bedside. See new orders

## 2019-05-03 NOTE — Progress Notes (Addendum)
   05/03/19 0852  Vitals  Temp (!) 103.3 F (39.6 C)  Temp Source Axillary  BP 140/85  MAP (mmHg) 66  BP Location Left Arm  BP Method Automatic  Patient Position (if appropriate) Lying  Pulse Rate (!) 125  Pulse Rate Source Monitor  ECG Heart Rate (!) 120  Resp 20  Oxygen Therapy  SpO2 99 %  O2 Device Room Air  O2 Flow Rate (L/min) 2 L/min  MEWS Score  MEWS RR 0  MEWS Pulse 2  MEWS Systolic 0  MEWS LOC 0  MEWS Temp 2  MEWS Score 4  MEWS Score Color Red  notified MD Mcclung  and ICU chafrgve heather  Informed by charge nurse pt vomited while taking po meds at 0851. Made MD Mcclung aware. See new orders

## 2019-05-03 NOTE — Progress Notes (Signed)
Notified MD McClung  Lab called and reported MRSA PCR positives

## 2019-05-03 NOTE — Progress Notes (Addendum)
   05/03/19 0936  Vitals  Temp (!) 102.6 F (39.2 C)  Temp Source Axillary  BP (!) 101/51  MAP (mmHg) 65  BP Location Left Arm  BP Method Automatic  Patient Position (if appropriate) Lying  Pulse Rate (!) 111  Pulse Rate Source Monitor  ECG Heart Rate (!) 113  Resp (!) 50  Level of Consciousness  Level of Consciousness Responds to Voice  Oxygen Therapy  SpO2 97 %  O2 Device Room Air  MEWS Score  MEWS RR 3  MEWS Pulse 2  MEWS Systolic 0  MEWS LOC 1  MEWS Temp 2  MEWS Score 8  MEWS Score Color Red   notifed MD and ICU Charge of change in RR 40-50 BPM

## 2019-05-04 LAB — CBC
HCT: 28.2 % — ABNORMAL LOW (ref 39.0–52.0)
Hemoglobin: 9.3 g/dL — ABNORMAL LOW (ref 13.0–17.0)
MCH: 29.4 pg (ref 26.0–34.0)
MCHC: 33 g/dL (ref 30.0–36.0)
MCV: 89.2 fL (ref 80.0–100.0)
Platelets: 269 10*3/uL (ref 150–400)
RBC: 3.16 MIL/uL — ABNORMAL LOW (ref 4.22–5.81)
RDW: 12.7 % (ref 11.5–15.5)
WBC: 8.9 10*3/uL (ref 4.0–10.5)
nRBC: 0 % (ref 0.0–0.2)

## 2019-05-04 LAB — GLUCOSE, CAPILLARY
Glucose-Capillary: 113 mg/dL — ABNORMAL HIGH (ref 70–99)
Glucose-Capillary: 143 mg/dL — ABNORMAL HIGH (ref 70–99)
Glucose-Capillary: 172 mg/dL — ABNORMAL HIGH (ref 70–99)
Glucose-Capillary: 174 mg/dL — ABNORMAL HIGH (ref 70–99)

## 2019-05-04 LAB — COMPREHENSIVE METABOLIC PANEL
ALT: 38 U/L (ref 0–44)
AST: 36 U/L (ref 15–41)
Albumin: 2.3 g/dL — ABNORMAL LOW (ref 3.5–5.0)
Alkaline Phosphatase: 57 U/L (ref 38–126)
Anion gap: 9 (ref 5–15)
BUN: 14 mg/dL (ref 8–23)
CO2: 22 mmol/L (ref 22–32)
Calcium: 7.6 mg/dL — ABNORMAL LOW (ref 8.9–10.3)
Chloride: 110 mmol/L (ref 98–111)
Creatinine, Ser: 1.03 mg/dL (ref 0.61–1.24)
GFR calc Af Amer: >60 mL/min (ref >60)
GFR calc non Af Amer: >60 mL/min (ref >60)
Glucose, Bld: 103 mg/dL — ABNORMAL HIGH (ref 70–99)
Potassium: 3.7 mmol/L (ref 3.5–5.1)
Sodium: 141 mmol/L (ref 135–145)
Total Bilirubin: 1.2 mg/dL (ref 0.3–1.2)
Total Protein: 5.8 g/dL — ABNORMAL LOW (ref 6.5–8.1)

## 2019-05-04 LAB — D-DIMER, QUANTITATIVE: D-Dimer, Quant: 3.71 ug/mL-FEU — ABNORMAL HIGH (ref 0.00–0.50)

## 2019-05-04 MED ORDER — KCL IN DEXTROSE-NACL 20-5-0.9 MEQ/L-%-% IV SOLN
INTRAVENOUS | Status: DC
  Filled 2019-05-04 (×3): qty 1000

## 2019-05-04 NOTE — Progress Notes (Signed)
Jeremy Parks  [id number] DOB: 25-Feb-1951 DOA: 04/26/2019 PCP: Sinda Du, MD    Brief Narrative:  68 year old SNF resident with a history of DM, seizure disorder, cerebral palsy, and HTN who was originally seen in the ED 12/18 with slurred speech which resolved.  Following this initial visit he required multiple repeat visits to the ED with waxing and waning confusion and lethargy.  On 12/22 his Covid test was positive.  He returned to the ED the night of 12/23 with worsening somnolence and was found to have a temperature of 101.3.  CXR noted patchy opacity at the left lung base.  He was admitted.  Significant Events: 12/18 ED visit with slurred speech 12/22 Covid test positive 12/23 admit via [facility] - transfer to [facility] 12/30 episode of   Devices: RUE PICC Foley   COVID-19 specific Treatment: Remdesivir 12/24 > 12/28 Actemra 12/25 Decadron 12/24 > 05/05/2018  Antimicrobials:  Rocephin 12/24 Cefepime 12/25 Zosyn 12/26 > 12/30  Subjective: RN reports that the patient is refusing oral intake.  Blood pressure has stabilized.  Saturations remain 99% on room air.  MRSA screen positive yesterday.  Repeat blood culture sent but currently pending. No further fever since initiation of Vanc.   Assessment & Plan:  Suspected recurrent aspiration event - Sepsis  Volume resuscitated - following BP - cont Vanc - d/c Zosyn as pt has completed 6 days of dosing, and actually developed fever while on Zosyn - repeat blood cx pending   COVID Pneumonia - acute hypoxic respiratory failure was dosed with Actemra and remdesivir - remains on steroid - has been weaned to RA - this does not appear to have been the primary issue driving his presentation - f/u CXR 12/30 w/o signif change in his pulm infiltrates   Recent Labs  Lab 04/27/19 1719 04/28/19 0015 04/29/19 0030 04/30/19 0725 05/01/19 0500 05/02/19 0526 05/02/19 0527 05/03/19 0455 05/04/19 0400  DDIMER  --  7.44* 8.54* 4.38* 2.82*   --  2.19*  --  3.71*  FERRITIN  --  347* 331 310 298 248  --   --   --   CRP  --  19.5* 28.1* 22.6* 15.0* 12.7*  --   --   --   ALT  --  58* 61* 53* 49*  --  42 46* 38  PROCALCITON 5.22  --  12.05 4.48  --   --   --   --   --     Aspiration pneumonia Competed a course of Zosyn - thin liquids per SLP recommendation - see above concering unwillingness to take in orals   Acute kidney injury Resolved with volume resuscitation  Hypokalemia  Corrected w/ supplementation   Acute urinary retention Foley catheter placed - added urecholine 12/29 and then foley able to be removed - monitor output   Proteus UTI Greater than 100,000 colonies in urine culture dated 12/24 - pan-sensisitve per culture data - has completed a course of abx tx   DM2 CBG controlled - follow   Acute delirium - altered mental status Unclear baseline - remains quite lethargic and minimally interactive   Hypernatremia Corrected w/ free water admin   Transaminitis likely due to Covid plus/minus remdesivir dosing - LFTs have returned to his baseline   Normocytic anemia anemia panel consistent with anemia of chronic disease/poor nutrition  Seizure disorder Cont usual meds   HTN BP reasonably controlled   HLD Continue statin  Goals of Care His perceived quality of life appear  quite low to me - he is refusing oral intake, and intermittently decompensating due to medical complications - I feel a NCB status, and perhaps limited interventions would be in his best interest - his situation is complex as he is not able to make his own decsions - I will ask Palliative Care to assist   DVT prophylaxis: Lovenox Code Status: FULL CODE Family Communication:  Disposition Plan: progressive care bed    Consultants:  none  Objective: Blood pressure (!) 142/77, pulse 77, temperature 99.3 F (37.4 C), temperature source Axillary, resp. rate 17, height 5\' 8"  (1.727 m), weight 82 kg, SpO2 97 %.  Intake/Output Summary  (Last 24 hours) at 05/04/2019 1432 Last data filed at 05/04/2019 1009 Gross per 24 hour  Intake 2003.75 ml  Output 2250 ml  Net -246.25 ml   Filed Weights   04/26/19 1925  Weight: 82 kg    Examination: General: sedate but appears comfortable  Lungs: no focal crackles - poor air movement B bases  Cardiovascular: RRR - no M or rub  Abdomen: NT/ND, soft, bs+ Extremities: No C/C/E B LE  CBC: Recent Labs  Lab 04/29/19 0030 04/30/19 0725 05/01/19 0500 05/02/19 0527 05/03/19 0455 05/04/19 0400  WBC 10.5 7.8 6.5 7.3 12.5* 8.9  NEUTROABS 8.7* 6.4 4.9  --   --   --   HGB 10.7* 10.2* 9.5* 9.4* 10.7* 9.3*  HCT 34.3* 32.4* 30.4* 29.7* 32.3* 28.2*  MCV 91.5 93.4 91.3 90.0 88.3 89.2  PLT 212 241 248 273 300 [id number]   Basic Metabolic Panel: Recent Labs  Lab 04/29/19 0030 04/30/19 0725 05/01/19 0500 05/02/19 0527 05/03/19 0455 05/04/19 0400  NA 149* 149* 146* 140 138 141  K 3.7 4.4 3.2* 3.5 3.7 3.7  CL 115* 116* 115* 111 107 110  CO2 25 23 24 23  20* 22  GLUCOSE 122* 213* 202* 185* 160* 103*  BUN 39* 31* 18 15 15 14   CREATININE 1.56* 1.35* 1.29* 1.18 1.17 1.03  CALCIUM 7.5* 7.5* 7.4* 7.5* 7.5* 7.6*  MG 2.3 2.5* 2.4  --  2.0  --   PHOS 3.0 1.6* 1.3*  --   --   --    GFR: Estimated Creatinine Clearance: 67.3 mL/min (by C-G formula based on SCr of 1.03 mg/dL).  Liver Function Tests: Recent Labs  Lab 05/01/19 0500 05/02/19 0527 05/03/19 0455 05/04/19 0400  AST 70* 50* 48* 36  ALT 49* 42 46* 38  ALKPHOS 50 48 58 57  BILITOT 1.0 1.3* 1.5* 1.2  PROT 6.2* 6.0* 6.4* 5.8*  ALBUMIN 2.5* 2.4* 2.5* 2.3*    HbA1C: Hgb A1c MFr Bld  Date/Time Value Ref Range Status  04/27/2019 06:09 AM 5.3 4.8 - 5.6 % Final    Comment:    (NOTE) Pre diabetes:          5.7%-6.4% Diabetes:              >6.4% Glycemic control for   <7.0% adults with diabetes   10/29/2018 04:53 AM 5.6 4.8 - 5.6 % Final    Comment:    (NOTE) Pre diabetes:          5.7%-6.4% Diabetes:               >6.4% Glycemic control for   <7.0% adults with diabetes     CBG: Recent Labs  Lab 05/03/19 1318 05/03/19 1530 05/03/19 2102 05/04/19 0800 05/04/19 1216  GLUCAP 141* 136* 103* 113* 143*    Recent Results (from the  past 240 hour(s))  Urine culture     Status: Abnormal   Collection Time: 04/27/19  1:02 AM   Specimen: Urine, Random  Result Value Ref Range Status   Specimen Description   Final    URINE, RANDOM Performed at [facility], [address] [place], [place] [address]    Special Requests   Final    NONE Performed at [facility], [address], [place], [place] [address]    Culture >=100,000 COLONIES/mL PROTEUS MIRABILIS (A)  Final   Report Status 04/29/2019 FINAL  Final   Organism ID, Bacteria PROTEUS MIRABILIS (A)  Final      Susceptibility   Proteus mirabilis - MIC*    AMPICILLIN <=2 SENSITIVE Sensitive     CEFAZOLIN <=4 SENSITIVE Sensitive     CEFTRIAXONE <=1 SENSITIVE Sensitive     CIPROFLOXACIN <=0.25 SENSITIVE Sensitive     GENTAMICIN <=1 SENSITIVE Sensitive     IMIPENEM 2 SENSITIVE Sensitive     NITROFURANTOIN 128 RESISTANT Resistant     TRIMETH/SULFA <=20 SENSITIVE Sensitive     AMPICILLIN/SULBACTAM <=2 SENSITIVE Sensitive     PIP/TAZO <=4 SENSITIVE Sensitive     * >=100,000 COLONIES/mL PROTEUS MIRABILIS  Culture, blood (routine x 2)     Status: None   Collection Time: 04/28/19 12:30 PM   Specimen: BLOOD  Result Value Ref Range Status   Specimen Description   Final    BLOOD RIGHT ANTECUBITAL Performed at [facility] [address], [place], [place] [address]    Special Requests   Final    BOTTLES DRAWN AEROBIC ONLY Blood Culture adequate volume Performed at [facility] [address], [place],  [place] [address]    Culture   Final    NO GROWTH 5 DAYS Performed at [facility], [place] [address], [place], [place] [address]    Report Status 05/03/2019 FINAL  Final  Culture, blood (routine x 2)      Status: None   Collection Time: 04/28/19 12:35 PM   Specimen: BLOOD  Result Value Ref Range Status   Specimen Description   Final    BLOOD RIGHT HAND Performed at [facility] [address], [place], [place] [address]    Special Requests   Final    BOTTLES DRAWN AEROBIC ONLY Blood Culture adequate volume Performed at [facility] [address] [place], [place] [address]    Culture   Final    NO GROWTH 5 DAYS Performed at [facility], [place] [address] [place], [place] [address]    Report Status 05/03/2019 FINAL  Final  MRSA PCR Screening     Status: Abnormal   Collection Time: 05/03/19 11:10 AM   Specimen: Nasopharyngeal  Result Value Ref Range Status   MRSA by PCR POSITIVE (A) NEGATIVE Final    Comment:        The GeneXpert MRSA Assay (FDA approved for NASAL specimens only), is one component of a comprehensive MRSA colonization surveillance program. It is not intended to diagnose MRSA infection nor to guide or monitor treatment for MRSA infections. RESULT CALLED TO, READ BACK BY AND VERIFIED WITH: TONING AT 1533 ON 05/03/2019 BY MOSLEY,J Performed at [facility], [place] [address], [place],  [address]   Culture, blood (routine x 2)     Status: None (Preliminary result)   Collection Time: 05/03/19 12:30 PM   Specimen: BLOOD  Result Value Ref Range Status  Specimen Description   Final    BLOOD LEFT HAND Performed at [facility] [address], [place], [place] [address]    Special Requests   Final    BOTTLES DRAWN AEROBIC ONLY Blood Culture adequate volume Performed at [facility] [address] [place], [place] [address]    Culture   Final    NO GROWTH < 24 HOURS Performed at [facility] [address], [place], [place] [address]    Report Status PENDING  Incomplete  Culture, blood (routine x 2)     Status: None (Preliminary result)    Collection Time: 05/03/19 12:43 PM   Specimen: BLOOD  Result Value Ref Range Status   Specimen Description   Final    BLOOD LEFT ARM Performed at [facility] [address], [place], [place] [address]    Special Requests   Final    BOTTLES DRAWN AEROBIC ONLY Blood Culture adequate volume Performed at [facility] [address] [place], [place] [address]    Culture   Final    NO GROWTH < 24 HOURS Performed at [facility] [address] [place],  [address]    Report Status PENDING  Incomplete     Scheduled Meds: . bethanechol  10 mg Oral QID  . Chlorhexidine Gluconate Cloth  6 each Topical Daily  . dexamethasone (DECADRON) injection  6 mg Intravenous Q0600  . dextromethorphan-guaiFENesin  1 tablet Oral BID  . docusate sodium  100 mg Oral BID  . enoxaparin (LOVENOX) injection  40 mg Subcutaneous Q24H  . insulin aspart  0-5 Units Subcutaneous QHS  . insulin aspart  0-9 Units Subcutaneous TID WC  . mupirocin ointment   Nasal BID   Continuous Infusions: . levETIRAcetam 500 mg (05/04/19 0808)  . sodium chloride Stopped (05/03/19 1045)  . vancomycin 1,500 mg (05/04/19 1140)     LOS: 7 days   Cherene Altes, MD [facility] Office  [phone] Pager - Text Page per Amion  If 7PM-7AM, please contact night-coverage per Amion 05/04/2019, 2:32 PM

## 2019-05-05 LAB — GLUCOSE, CAPILLARY
Glucose-Capillary: 241 mg/dL — ABNORMAL HIGH (ref 70–99)
Glucose-Capillary: 249 mg/dL — ABNORMAL HIGH (ref 70–99)
Glucose-Capillary: 262 mg/dL — ABNORMAL HIGH (ref 70–99)
Glucose-Capillary: 225 mg/dL — ABNORMAL HIGH (ref 70–99)

## 2019-05-05 NOTE — Progress Notes (Signed)
Jeremy Parks  [id number] DOB: 1950/06/01 DOA: 04/26/2019 PCP: Sinda Du, MD    Brief Narrative:  69 year old SNF resident with a history of DM, seizure disorder, cerebral palsy, and HTN who was originally seen in the ED 12/18 with slurred speech which resolved.  Following this initial visit he required multiple repeat visits to the ED with waxing and waning confusion and lethargy.  On 12/22 his Covid test was positive.  He returned to the ED the night of 12/23 with worsening somnolence and was found to have a temperature of 101.3.  CXR noted patchy opacity at the left lung base.  He was admitted.  Significant Events: 12/18 ED visit with slurred speech 12/22 Covid test positive 12/23 admit via [facility] - transfer to [facility] 12/30 episode of   Devices: RUE PICC Foley   COVID-19 specific Treatment: Remdesivir 12/24 > 12/28 Actemra 12/25 Decadron 12/24 > 05/05/2018  Antimicrobials:  Rocephin 12/24 Cefepime 12/25 Zosyn 12/26 > 12/30  Subjective: Oxygen saturations 97-100% on room air.  Blood pressure stable.  No tachycardia.  Opens eyes to my exam but will not follow commands or answer questions.  Does not appear uncomfortable.  Assessment & Plan:  Suspected recurrent aspiration event - Sepsis  Volume resuscitated - sepsis resolved - cont Vanc - d/c Zosyn as pt has completed 6 days of dosing, and actually developed fever while on Zosyn - repeat blood cx no growth x2 days thus far  COVID Pneumonia - acute hypoxic respiratory failure was dosed with Actemra and remdesivir - remains on steroid - has been weaned to RA - this does not appear to have been the primary issue driving his presentation - f/u CXR 12/30 w/o signif change in his pulm infiltrates   Aspiration pneumonia Competed a course of Zosyn - thin liquids per SLP recommendation - see above concering unwillingness to take in orals   Acute kidney injury Resolved with volume resuscitation  Hypokalemia  Corrected w/  supplementation   Acute urinary retention Foley catheter placed - added urecholine 12/29 and then foley able to be removed - monitor output   Proteus UTI Greater than 100,000 colonies in urine culture dated 12/24 - pan-sensisitve per culture data - has completed a course of abx tx   DM2 CBG controlled - follow   Acute delirium - altered mental status Unclear baseline -mental status now waxing and waning  Hypernatremia Corrected w/ free water admin   Transaminitis likely due to Covid plus/minus remdesivir dosing - LFTs have returned to normal  Normocytic anemia anemia panel consistent with anemia of chronic disease/poor nutrition  Seizure disorder Cont usual meds   HTN BP reasonably controlled   HLD Continue statin  Goals of Care His perceived quality of life appear quite low to me - he is refusing oral intake, and intermittently decompensating due to medical complications - I feel a NCB status, and perhaps limited interventions would be in his best interest - his situation is complex as he is not able to make his own decsions - I will ask Palliative Care to assist   DVT prophylaxis: Lovenox Code Status: FULL CODE Family Communication:  Disposition Plan: MedSurg bed  Consultants:  none  Objective: Blood pressure 128/69, pulse 71, temperature 98.4 F (36.9 C), temperature source Axillary, resp. rate 16, height 5\' 8"  (1.727 m), weight 82 kg, SpO2 97 %.  Intake/Output Summary (Last 24 hours) at 05/05/2019 0949 Last data filed at 05/05/2019 0300 Gross per 24 hour  Intake 800.4 ml  Output 1225 ml  Net -424.6 ml   Filed Weights   04/26/19 1925  Weight: 82 kg    Examination: General: More alert today -no acute distress Lungs: no focal crackles - poor air movement B bases without change Cardiovascular: RRR - no M or rub  Abdomen: NT/ND, soft, bs+ Extremities: No C/C/E B lower extremities  CBC: Recent Labs  Lab 04/29/19 0030 04/30/19 0725 05/01/19 0500  05/02/19 0527 05/03/19 0455 05/04/19 0400  WBC 10.5 7.8 6.5 7.3 12.5* 8.9  NEUTROABS 8.7* 6.4 4.9  --   --   --   HGB 10.7* 10.2* 9.5* 9.4* 10.7* 9.3*  HCT 34.3* 32.4* 30.4* 29.7* 32.3* 28.2*  MCV 91.5 93.4 91.3 90.0 88.3 89.2  PLT 212 241 248 273 300 [id number]   Basic Metabolic Panel: Recent Labs  Lab 04/29/19 0030 04/30/19 0725 05/01/19 0500 05/02/19 0527 05/03/19 0455 05/04/19 0400  NA 149* 149* 146* 140 138 141  K 3.7 4.4 3.2* 3.5 3.7 3.7  CL 115* 116* 115* 111 107 110  CO2 25 23 24 23  20* 22  GLUCOSE 122* 213* 202* 185* 160* 103*  BUN 39* 31* 18 15 15 14   CREATININE 1.56* 1.35* 1.29* 1.18 1.17 1.03  CALCIUM 7.5* 7.5* 7.4* 7.5* 7.5* 7.6*  MG 2.3 2.5* 2.4  --  2.0  --   PHOS 3.0 1.6* 1.3*  --   --   --    GFR: Estimated Creatinine Clearance: 67.3 mL/min (by C-G formula based on SCr of 1.03 mg/dL).  Liver Function Tests: Recent Labs  Lab 05/01/19 0500 05/02/19 0527 05/03/19 0455 05/04/19 0400  AST 70* 50* 48* 36  ALT 49* 42 46* 38  ALKPHOS 50 48 58 57  BILITOT 1.0 1.3* 1.5* 1.2  PROT 6.2* 6.0* 6.4* 5.8*  ALBUMIN 2.5* 2.4* 2.5* 2.3*    HbA1C: Hgb A1c MFr Bld  Date/Time Value Ref Range Status  04/27/2019 06:09 AM 5.3 4.8 - 5.6 % Final    Comment:    (NOTE) Pre diabetes:          5.7%-6.4% Diabetes:              >6.4% Glycemic control for   <7.0% adults with diabetes   10/29/2018 04:53 AM 5.6 4.8 - 5.6 % Final    Comment:    (NOTE) Pre diabetes:          5.7%-6.4% Diabetes:              >6.4% Glycemic control for   <7.0% adults with diabetes     CBG: Recent Labs  Lab 05/04/19 0800 05/04/19 1216 05/04/19 1800 05/04/19 2000 05/05/19 0817  GLUCAP 113* 143* 172* 174* 241*    Recent Results (from the past 240 hour(s))  Urine culture     Status: Abnormal   Collection Time: 04/27/19  1:02 AM   Specimen: Urine, Random  Result Value Ref Range Status   Specimen Description   Final    URINE, RANDOM Performed at [facility], [address]  [place], [place] [address]    Special Requests   Final    NONE Performed at [facility], [address] [place], [place] [address]    Culture >=100,000 COLONIES/mL PROTEUS MIRABILIS (A)  Final   Report Status 04/29/2019 FINAL  Final   Organism ID, Bacteria PROTEUS MIRABILIS (A)  Final      Susceptibility   Proteus mirabilis - MIC*    AMPICILLIN <=2 SENSITIVE Sensitive     CEFAZOLIN <=  4 SENSITIVE Sensitive     CEFTRIAXONE <=1 SENSITIVE Sensitive     CIPROFLOXACIN <=0.25 SENSITIVE Sensitive     GENTAMICIN <=1 SENSITIVE Sensitive     IMIPENEM 2 SENSITIVE Sensitive     NITROFURANTOIN 128 RESISTANT Resistant     TRIMETH/SULFA <=20 SENSITIVE Sensitive     AMPICILLIN/SULBACTAM <=2 SENSITIVE Sensitive     PIP/TAZO <=4 SENSITIVE Sensitive     * >=100,000 COLONIES/mL PROTEUS MIRABILIS  Culture, blood (routine x 2)     Status: None   Collection Time: 04/28/19 12:30 PM   Specimen: BLOOD  Result Value Ref Range Status   Specimen Description   Final    BLOOD RIGHT ANTECUBITAL Performed at [facility] [address], [place], [place] [address]    Special Requests   Final    BOTTLES DRAWN AEROBIC ONLY Blood Culture adequate volume Performed at [facility] [address] [place], [place] [address]    Culture   Final    NO GROWTH 5 DAYS Performed at [facility], [place] [address], [place], [place] [address]    Report Status 05/03/2019 FINAL  Final  Culture, blood (routine x 2)     Status: None   Collection Time: 04/28/19 12:35 PM   Specimen: BLOOD  Result Value Ref Range Status   Specimen Description   Final    BLOOD RIGHT HAND Performed at [facility] [address] [place], [place] [address]    Special Requests   Final    BOTTLES DRAWN AEROBIC ONLY Blood Culture adequate volume Performed at [facility] [address] [place], [place] [address]    Culture   Final    NO GROWTH 5 DAYS Performed at  [facility], [place] [address], [place], [place] [address]    Report Status 05/03/2019 FINAL  Final  MRSA PCR Screening     Status: Abnormal   Collection Time: 05/03/19 11:10 AM   Specimen: Nasopharyngeal  Result Value Ref Range Status   MRSA by PCR POSITIVE (A) NEGATIVE Final    Comment:        The GeneXpert MRSA Assay (FDA approved for NASAL specimens only), is one component of a comprehensive MRSA colonization surveillance program. It is not intended to diagnose MRSA infection nor to guide or monitor treatment for MRSA infections. RESULT CALLED TO, READ BACK BY AND VERIFIED WITH: TONING AT 1533 ON 05/03/2019 BY MOSLEY,J Performed at [facility] [place] [address] [place], [place] [address]   Culture, blood (routine x 2)     Status: None (Preliminary result)   Collection Time: 05/03/19 12:30 PM   Specimen: BLOOD  Result Value Ref Range Status   Specimen Description   Final    BLOOD LEFT HAND Performed at [facility] [address] [place], [place] [address]    Special Requests   Final    BOTTLES DRAWN AEROBIC ONLY Blood Culture adequate volume Performed at [facility] [address], [place], [place] [address]    Culture   Final    NO GROWTH < 24 HOURS Performed at [facility] [address], [place], [place] [address]    Report Status PENDING  Incomplete  Culture, blood (routine x 2)     Status: None (Preliminary result)   Collection Time: 05/03/19 12:43 PM   Specimen: BLOOD  Result Value Ref Range Status   Specimen Description   Final  BLOOD LEFT ARM Performed at [facility], [place] [address], [place], [place] [address]    Special Requests   Final    BOTTLES DRAWN AEROBIC ONLY Blood Culture adequate volume Performed at [facility] [address], [place], [place] [address]    Culture   Final    NO GROWTH < 24 HOURS Performed at [facility] [address], [place], [place] [address]    Report Status PENDING  Incomplete     Scheduled Meds: . bethanechol  10 mg Oral QID  . Chlorhexidine Gluconate Cloth  6 each Topical Daily  . dexamethasone (DECADRON) injection  6 mg Intravenous Q0600  . dextromethorphan-guaiFENesin  1 tablet Oral BID  . docusate sodium  100 mg Oral BID  . enoxaparin (LOVENOX) injection  40 mg Subcutaneous Q24H  . insulin aspart  0-5 Units Subcutaneous QHS  . insulin aspart  0-9 Units Subcutaneous TID WC  . mupirocin ointment   Nasal BID   Continuous Infusions: . dextrose 5 % and 0.9 % NaCl with KCl 20 mEq/L 60 mL/hr at 05/05/19 0612  . levETIRAcetam 500 mg (05/05/19 0818)  . sodium chloride Stopped (05/03/19 1045)  . vancomycin Stopped (05/04/19 1900)     LOS: 8 days   Cherene Altes, MD [facility] Office  [phone] Pager - Text Page per Amion  If 7PM-7AM, please contact night-coverage per Amion 05/05/2019, 9:49 AM

## 2019-05-06 LAB — BASIC METABOLIC PANEL
Anion gap: 7 (ref 5–15)
BUN: 17 mg/dL (ref 8–23)
CO2: 22 mmol/L (ref 22–32)
Calcium: 8.2 mg/dL — ABNORMAL LOW (ref 8.9–10.3)
Chloride: 116 mmol/L — ABNORMAL HIGH (ref 98–111)
Creatinine, Ser: 0.89 mg/dL (ref 0.61–1.24)
GFR calc Af Amer: >60 mL/min (ref >60)
GFR calc non Af Amer: >60 mL/min (ref >60)
Glucose, Bld: 206 mg/dL — ABNORMAL HIGH (ref 70–99)
Potassium: 4 mmol/L (ref 3.5–5.1)
Sodium: 145 mmol/L (ref 135–145)

## 2019-05-06 LAB — GLUCOSE, CAPILLARY
Glucose-Capillary: 190 mg/dL — ABNORMAL HIGH (ref 70–99)
Glucose-Capillary: 242 mg/dL — ABNORMAL HIGH (ref 70–99)
Glucose-Capillary: 196 mg/dL — ABNORMAL HIGH (ref 70–99)
Glucose-Capillary: 182 mg/dL — ABNORMAL HIGH (ref 70–99)

## 2019-05-06 LAB — CBC
HCT: 29.8 % — ABNORMAL LOW (ref 39.0–52.0)
Hemoglobin: 9.6 g/dL — ABNORMAL LOW (ref 13.0–17.0)
MCH: 28.5 pg (ref 26.0–34.0)
MCHC: 32.2 g/dL (ref 30.0–36.0)
MCV: 88.4 fL (ref 80.0–100.0)
Platelets: 321 10*3/uL (ref 150–400)
RBC: 3.37 MIL/uL — ABNORMAL LOW (ref 4.22–5.81)
RDW: 12.8 % (ref 11.5–15.5)
WBC: 14 10*3/uL — ABNORMAL HIGH (ref 4.0–10.5)
nRBC: 0.2 % (ref 0.0–0.2)

## 2019-05-06 MED ORDER — METOPROLOL TARTRATE 5 MG/5ML IV SOLN
5.0000 mg | Freq: Four times a day (QID) | INTRAVENOUS | Status: DC | PRN
  Administered 2019-05-06: 5 mg via INTRAVENOUS
  Filled 2019-05-06: qty 5

## 2019-05-06 MED ORDER — CARBAMAZEPINE 200 MG PO TABS
300.0000 mg | ORAL_TABLET | Freq: Two times a day (BID) | ORAL | Status: DC
  Administered 2019-05-07 – 2019-05-09 (×5): 300 mg via ORAL
  Filled 2019-05-06 (×7): qty 1.5

## 2019-05-06 MED ORDER — AMLODIPINE BESYLATE 10 MG PO TABS
10.0000 mg | ORAL_TABLET | Freq: Every day | ORAL | Status: DC
  Administered 2019-05-06 – 2019-05-09 (×4): 10 mg via ORAL
  Filled 2019-05-06 (×4): qty 1

## 2019-05-06 MED ORDER — LEVETIRACETAM IN NACL 500 MG/100ML IV SOLN: 500.0000 mg | Freq: Two times a day (BID) | INTRAVENOUS | Status: DC

## 2019-05-06 MED ORDER — LEVETIRACETAM IN NACL 500 MG/100ML IV SOLN
500.0000 mg | Freq: Two times a day (BID) | INTRAVENOUS | Status: AC
  Administered 2019-05-06: 500 mg via INTRAVENOUS
  Filled 2019-05-06: qty 100

## 2019-05-06 MED ORDER — INSULIN DETEMIR 100 UNIT/ML ~~LOC~~ SOLN
10.0000 [IU] | Freq: Every day | SUBCUTANEOUS | Status: DC
  Administered 2019-05-06 – 2019-05-09 (×4): 10 [IU] via SUBCUTANEOUS
  Filled 2019-05-06 (×4): qty 0.1

## 2019-05-06 NOTE — Progress Notes (Signed)
Jeremy Parks  [id number] DOB: 02-07-1951 DOA: 04/26/2019 PCP: Sinda Du, MD    Brief Narrative:  69 year old SNF resident with a history of DM, seizure disorder, cerebral palsy, and HTN who was originally seen in the ED 12/18 with slurred speech which resolved.  Following this initial visit he required multiple repeat visits to the ED with waxing and waning confusion and lethargy.  On 12/22 his Covid test was positive.  He returned to the ED the night of 12/23 with worsening somnolence and was found to have a temperature of 101.3.  CXR noted patchy opacity at the left lung base.  He was admitted.  Significant Events: 12/18 ED visit with slurred speech 12/22 Covid test positive 12/23 admit via [place] - transfer to [facility] 12/30 episode of   Devices: RUE PICC Foley   COVID-19 specific Treatment: Remdesivir 12/24 > 12/28 Actemra 12/25 Decadron 12/24 > 05/05/2018  Antimicrobials:  Rocephin 12/24 Cefepime 12/25 Zosyn 12/26 > 12/30 Vancomycin 12/30 >  Subjective: Blood pressure trending upward.  Saturations 99-100% on room air.  Significantly more alert today.  Will answer many of my questions.  Appears to be approaching his baseline.  Assessment & Plan:  FUO - Sepsis  Volume resuscitated - sepsis resolved - ?recurrent aspiration event - pt completed 6 days of Zosyn, and developed fever while on Zosyn - cont Vanc - repeat blood cx no growth x3 days thus far - MRSA screen +  COVID Pneumonia - acute hypoxic respiratory failure was dosed with Actemra and remdesivir and a 10 day course of decadron - has been weaned to RA - this does not appear to have been the primary issue driving his presentation - f/u CXR 12/30 w/o signif change in his pulm infiltrates   Aspiration pneumonia Competed a course of Zosyn - thin liquids per SLP recommendation - more alert now so hopeful will soon be able to advance diet   Acute kidney injury Resolved with volume resuscitation  Hypokalemia   Corrected w/ supplementation   Acute urinary retention Foley catheter placed - added urecholine 12/29 and then foley able to be removed - no evidence of recurrence thus far   Proteus UTI Greater than 100,000 colonies in urine culture dated 12/24 - pan-sensisitve per culture data - has completed a course of abx tx   DM2 CBG not at goal - adjust treatment and follow trend  Acute delirium - altered mental status Unclear baseline -mental status now waxing and waning but does appear to be improving at this time  Hypernatremia Corrected w/ free water admin   Transaminitis likely due to Covid plus/minus remdesivir dosing - LFTs have returned to normal  Normocytic anemia anemia panel consistent with anemia of chronic disease/poor nutrition  Seizure disorder Cont usual meds   HTN BP reasonably controlled   HLD Continue statin  Goals of Care His perceived quality of life appear quite low to me - he is refusing oral intake, and intermittently decompensating due to medical complications - I feel a NCB status, and perhaps limited interventions would be in his best interest - his situation is complex as he is not able to make his own decsions - I will ask Palliative Care to assist   DVT prophylaxis: Lovenox Code Status: FULL CODE Family Communication:  Disposition Plan: MedSurg bed - may soon be able to return to SNF if oral intake becomes more consistent   Consultants:  none  Objective: Blood pressure (!) 171/85, pulse 67, temperature 99 F (  37.2 C), temperature source Axillary, resp. rate 16, height 5\' 8"  (1.727 m), weight 82 kg, SpO2 99 %.  Intake/Output Summary (Last 24 hours) at 05/06/2019 0904 Last data filed at 05/06/2019 0300 Gross per 24 hour  Intake 1268.12 ml  Output 1710 ml  Net -441.88 ml   Filed Weights   04/26/19 1925  Weight: 82 kg    Examination: General: More alert today - no resp distress  Lungs: no focal crackles - poor air movement B bases   Cardiovascular: RRR Abdomen: NT/ND, soft, bs+ Extremities: No C/C/E B LE  CBC: Recent Labs  Lab 04/30/19 0725 05/01/19 0500 05/03/19 0455 05/04/19 0400 05/06/19 0230  WBC 7.8 6.5 12.5* 8.9 14.0*  NEUTROABS 6.4 4.9  --   --   --   HGB 10.2* 9.5* 10.7* 9.3* 9.6*  HCT 32.4* 30.4* 32.3* 28.2* 29.8*  MCV 93.4 91.3 88.3 89.2 88.4  PLT 241 248 300 269 [id number]   Basic Metabolic Panel: Recent Labs  Lab 04/30/19 0725 05/01/19 0500 05/03/19 0455 05/04/19 0400 05/06/19 0230  NA 149* 146* 138 141 145  K 4.4 3.2* 3.7 3.7 4.0  CL 116* 115* 107 110 116*  CO2 23 24 20* 22 22  GLUCOSE 213* 202* 160* 103* 206*  BUN 31* 18 15 14 17   CREATININE 1.35* 1.29* 1.17 1.03 0.89  CALCIUM 7.5* 7.4* 7.5* 7.6* 8.2*  MG 2.5* 2.4 2.0  --   --   PHOS 1.6* 1.3*  --   --   --    GFR: Estimated Creatinine Clearance: 77.9 mL/min (by C-G formula based on SCr of 0.89 mg/dL).  Liver Function Tests: Recent Labs  Lab 05/01/19 0500 05/02/19 0527 05/03/19 0455 05/04/19 0400  AST 70* 50* 48* 36  ALT 49* 42 46* 38  ALKPHOS 50 48 58 57  BILITOT 1.0 1.3* 1.5* 1.2  PROT 6.2* 6.0* 6.4* 5.8*  ALBUMIN 2.5* 2.4* 2.5* 2.3*    HbA1C: Hgb A1c MFr Bld  Date/Time Value Ref Range Status  04/27/2019 06:09 AM 5.3 4.8 - 5.6 % Final    Comment:    (NOTE) Pre diabetes:          5.7%-6.4% Diabetes:              >6.4% Glycemic control for   <7.0% adults with diabetes   10/29/2018 04:53 AM 5.6 4.8 - 5.6 % Final    Comment:    (NOTE) Pre diabetes:          5.7%-6.4% Diabetes:              >6.4% Glycemic control for   <7.0% adults with diabetes     CBG: Recent Labs  Lab 05/05/19 0817 05/05/19 1210 05/05/19 1615 05/05/19 2021 05/06/19 0728  GLUCAP 241* 249* 262* 225* 190*    Recent Results (from the past 240 hour(s))  Urine culture     Status: Abnormal   Collection Time: 04/27/19  1:02 AM   Specimen: Urine, Random  Result Value Ref Range Status   Specimen Description   Final    URINE, RANDOM  Performed at [facility], [address], [place], [place] [address]    Special Requests   Final    NONE Performed at [facility], [address] [place], [place] [address]    Culture >=100,000 COLONIES/mL PROTEUS MIRABILIS (A)  Final   Report Status 04/29/2019 FINAL  Final   Organism ID, Bacteria PROTEUS MIRABILIS (A)  Final      Susceptibility  Proteus mirabilis - MIC*    AMPICILLIN <=2 SENSITIVE Sensitive     CEFAZOLIN <=4 SENSITIVE Sensitive     CEFTRIAXONE <=1 SENSITIVE Sensitive     CIPROFLOXACIN <=0.25 SENSITIVE Sensitive     GENTAMICIN <=1 SENSITIVE Sensitive     IMIPENEM 2 SENSITIVE Sensitive     NITROFURANTOIN 128 RESISTANT Resistant     TRIMETH/SULFA <=20 SENSITIVE Sensitive     AMPICILLIN/SULBACTAM <=2 SENSITIVE Sensitive     PIP/TAZO <=4 SENSITIVE Sensitive     * >=100,000 COLONIES/mL PROTEUS MIRABILIS  Culture, blood (routine x 2)     Status: None   Collection Time: 04/28/19 12:30 PM   Specimen: BLOOD  Result Value Ref Range Status   Specimen Description   Final    BLOOD RIGHT ANTECUBITAL Performed at [facility] [address] [place], [place] [address]    Special Requests   Final    BOTTLES DRAWN AEROBIC ONLY Blood Culture adequate volume Performed at [facility] [address] [place], [place] [address]    Culture   Final    NO GROWTH 5 DAYS Performed at [facility], [place] [address], [place], [place] [address]    Report Status 05/03/2019 FINAL  Final  Culture, blood (routine x 2)     Status: None   Collection Time: 04/28/19 12:35 PM   Specimen: BLOOD  Result Value Ref Range Status   Specimen Description   Final    BLOOD RIGHT HAND Performed at [facility] [address] [place], [place] [address]    Special Requests   Final    BOTTLES DRAWN AEROBIC ONLY Blood Culture adequate volume Performed at [facility] [address], [place], [place] [address]     Culture   Final    NO GROWTH 5 DAYS Performed at [facility], [place] [address], [place], [place] [address]    Report Status 05/03/2019 FINAL  Final  MRSA PCR Screening     Status: Abnormal   Collection Time: 05/03/19 11:10 AM   Specimen: Nasopharyngeal  Result Value Ref Range Status   MRSA by PCR POSITIVE (A) NEGATIVE Final    Comment:        The GeneXpert MRSA Assay (FDA approved for NASAL specimens only), is one component of a comprehensive MRSA colonization surveillance program. It is not intended to diagnose MRSA infection nor to guide or monitor treatment for MRSA infections. RESULT CALLED TO, READ BACK BY AND VERIFIED WITH: TONING AT 1533 ON 05/03/2019 BY MOSLEY,J Performed at [facility], [place] [address], [place], [place] [address]   Culture, blood (routine x 2)     Status: None (Preliminary result)   Collection Time: 05/03/19 12:30 PM   Specimen: BLOOD  Result Value Ref Range Status   Specimen Description   Final    BLOOD LEFT HAND Performed at [facility] [address] [place], [place] [address]    Special Requests   Final    BOTTLES DRAWN AEROBIC ONLY Blood Culture adequate volume Performed at [facility] [address], [place], [place] [address]    Culture   Final    NO GROWTH 2 DAYS Performed at [facility] [address], [place], [place] [address]    Report Status PENDING  Incomplete  Culture, blood (routine x 2)     Status: None (Preliminary result)   Collection Time: 05/03/19 12:43 PM  Specimen: BLOOD  Result Value Ref Range Status   Specimen Description   Final    BLOOD LEFT ARM Performed at [facility] [address], [place], [place] [address]    Special Requests   Final    BOTTLES DRAWN AEROBIC ONLY Blood Culture adequate volume Performed at [facility] [address] [place], [place] [address]    Culture   Final    NO  GROWTH 2 DAYS Performed at [facility] [address], [place], [place] [address]    Report Status PENDING  Incomplete     Scheduled Meds: . amLODipine  10 mg Oral Daily  . Chlorhexidine Gluconate Cloth  6 each Topical Daily  . dextromethorphan-guaiFENesin  1 tablet Oral BID  . docusate sodium  100 mg Oral BID  . enoxaparin (LOVENOX) injection  40 mg Subcutaneous Q24H  . insulin aspart  0-5 Units Subcutaneous QHS  . insulin aspart  0-9 Units Subcutaneous TID WC  . mupirocin ointment   Nasal BID   Continuous Infusions: . levETIRAcetam 500 mg (05/06/19 0826)  . sodium chloride Stopped (05/03/19 1045)  . vancomycin Stopped (05/05/19 1356)     LOS: 9 days   Cherene Altes, MD [facility] Office  [phone] Pager - Text Page per Amion  If 7PM-7AM, please contact night-coverage per Amion 05/06/2019, 9:04 AM

## 2019-05-06 NOTE — Consult Note (Addendum)
Pharmacy Antibiotic Note  Jeremy Parks is a 69 y.o. male admitted on 04/26/2019 with pneumonia.  Continues on Vancomycin Day # 4  Afebrile, WBC WNL Cultures with NGTD  Plan: Zosyn stopped Vancomycin 1500 mg iv Q 24 hours - Consider stopping?     Height: 5\' 8"  (172.7 cm) Weight: 180 lb 12.4 oz (82 kg) IBW/kg (Calculated) : 68.4  Temp (24hrs), Avg:98.3 F (36.8 C), Min:97.6 F (36.4 C), Max:99 F (37.2 C)  Recent Labs  Lab 05/01/19 0500 05/02/19 0527 05/03/19 0455 05/04/19 0400 05/06/19 0230  WBC 6.5 7.3 12.5* 8.9 14.0*  CREATININE 1.29* 1.18 1.17 1.03 0.89    Estimated Creatinine Clearance: 77.9 mL/min (by C-G formula based on SCr of 0.89 mg/dL).    No Known Allergies  Thank you Anette Guarneri, PharmD  05/06/2019 11:58 AM

## 2019-05-06 NOTE — Progress Notes (Signed)
Palliative:   Thank you for this consult. I have reviewed the chart and attempted to contact patient's listed contact for [place] discussion.   Number listed is for [place] was available to take my call and there was not a way to leave a message.   I will attempt to reach patient's DSS worker again on Monday when hopefully someone will be in the office.   Mariana Kaufman, AGNP-C Palliative Medicine  Please call Palliative Medicine team phone with any questions [phone]. For individual providers please see AMION.  No charge

## 2019-05-07 LAB — GLUCOSE, CAPILLARY
Glucose-Capillary: 141 mg/dL — ABNORMAL HIGH (ref 70–99)
Glucose-Capillary: 170 mg/dL — ABNORMAL HIGH (ref 70–99)
Glucose-Capillary: 139 mg/dL — ABNORMAL HIGH (ref 70–99)
Glucose-Capillary: 148 mg/dL — ABNORMAL HIGH (ref 70–99)

## 2019-05-07 MED ORDER — ZOLPIDEM TARTRATE 5 MG PO TABS: 5.0000 mg | ORAL_TABLET | Freq: Every evening | ORAL | Status: DC | PRN

## 2019-05-07 MED ORDER — ENSURE ENLIVE PO LIQD
237.0000 mL | Freq: Two times a day (BID) | ORAL | Status: DC
  Administered 2019-05-08 – 2019-05-09 (×3): 237 mL via ORAL

## 2019-05-07 MED ORDER — ADULT MULTIVITAMIN W/MINERALS CH
1.0000 | ORAL_TABLET | Freq: Every day | ORAL | Status: DC
  Administered 2019-05-07 – 2019-05-09 (×3): 1 via ORAL
  Filled 2019-05-07 (×3): qty 1

## 2019-05-07 NOTE — Progress Notes (Signed)
Pharmacy note - antibiotics  Today is Day 5 of vancomycin for pneumonia, MRSA PCR positive.  Asked Dr. Thereasa Solo about length of therapy and will stop after today's dose.  Orders entered.  Heide Guile, PharmD, BCPS-AQ ID Clinical Pharmacist

## 2019-05-07 NOTE — Progress Notes (Signed)
Jeremy Parks  [id number] DOB: 1950-05-07 DOA: 04/26/2019 PCP: Sinda Du, MD    Brief Narrative:  69yo SNF resident with a history of DM, seizure disorder, cerebral palsy, and HTN who was originally seen in the ED 12/18 with slurred speech which resolved.  Following this initial visit he required multiple repeat visits to the ED with waxing and waning confusion and lethargy.  On 12/22 his Covid test was positive.  He returned to the ED the night of 12/23 with worsening somnolence and was found to have a temperature of 101.3.  CXR noted patchy opacity at the left lung base.  He was admitted.  Significant Events: 12/18 ED visit with slurred speech 12/22 Covid test positive 12/23 admit via [place] - transfer [facility] 12/30 episode of desaturation w/ fever, hypotension, tachycardia   Devices: RUE PICC  COVID-19 specific Treatment: Remdesivir 12/24 > 12/28 Actemra 12/25 Decadron 12/24 > 05/05/2018  Antimicrobials:  Rocephin 12/24 Cefepime 12/25 Zosyn 12/26 > 12/30 Vancomycin 12/30 > 1/3  Subjective: Appears to be at his probable baseline.  Responds to simple questions.  Denies discomfort.  Appears comfortable.  No respiratory distress.  Assessment & Plan:  FUO - Sepsis  Volume resuscitated - sepsis resolved - probable recurrent aspiration event - pt completed 6 days of Zosyn, and developed fever near end of course of Zosyn - cont Vanc to complete a 5 day course (MRSA screen +) - repeat blood cx no growth x4 days thus far   COVID Pneumonia - acute hypoxic respiratory failure was dosed with Actemra and remdesivir and a 10 day course of decadron - has been weaned to RA - this does not appear to have been the primary issue driving his presentation - f/u CXR 12/30 w/o signif change in his pulm infiltrates   Aspiration pneumonia Competed a course of Zosyn - thin liquids per SLP recommendation - more alert now so hopeful will soon be able to advance diet - will re-consult SLP (last  saw 12/29)  Acute kidney injury Resolved with volume resuscitation  Hypokalemia  Corrected w/ supplementation   Acute urinary retention Foley catheter placed - added urecholine 12/29 and then foley able to be removed - no evidence of recurrence thus far   Proteus UTI Greater than 100,000 colonies in urine culture dated 12/24 - pan-sensisitve per culture data - has completed a course of abx tx   DM2 CBG controlled w/ pt now off steroids   Acute delirium - altered mental status Unclear baseline -mental status waxing and waning but does appear to be improving at this time - will respond to simple questions appropriately   Hypernatremia Corrected w/ free water admin   Transaminitis likely due to Covid plus/minus remdesivir dosing - LFTs have returned to normal  Normocytic anemia anemia panel consistent with anemia of chronic disease/poor nutrition  Seizure disorder Cont usual meds   HTN BP reasonably controlled   HLD Continue statin  Goals of Care His perceived quality of life appear quite low to me - he is refusing oral intake, and intermittently decompensating due to medical complications - I feel a NCB status, and perhaps limited interventions would be in his best interest - his situation is complex as he is not able to make his own decsions - I will ask Palliative Care to assist   DVT prophylaxis: Lovenox Code Status: FULL CODE Family Communication:  Disposition Plan: MedSurg bed - if mental status remains clear, he does not have fever over night,  and his diet is advanced by SLP he wil be cleared for d/c to SNF 1/4   Consultants:  none  Objective: Blood pressure (!) 144/80, pulse 89, temperature 97.8 F (36.6 C), temperature source Axillary, resp. rate 16, height 5\' 8"  (1.727 m), weight 82 kg, SpO2 92 %.  Intake/Output Summary (Last 24 hours) at 05/07/2019 0912 Last data filed at 05/06/2019 1819 Gross per 24 hour  Intake 720 ml  Output 600 ml  Net 120 ml    Filed Weights   04/26/19 1925  Weight: 82 kg    Examination: General: More alert today - no distress  Lungs: no focal crackles - poor air movement B bases - no wheezing   Cardiovascular: RRR - no M  Abdomen: NT/ND, soft, bs+ Extremities: No C/C/E B lower extremities   CBC: Recent Labs  Lab 05/01/19 0500 05/03/19 0455 05/04/19 0400 05/06/19 0230  WBC 6.5 12.5* 8.9 14.0*  NEUTROABS 4.9  --   --   --   HGB 9.5* 10.7* 9.3* 9.6*  HCT 30.4* 32.3* 28.2* 29.8*  MCV 91.3 88.3 89.2 88.4  PLT 248 300 269 [id number]   Basic Metabolic Panel: Recent Labs  Lab 05/01/19 0500 05/03/19 0455 05/04/19 0400 05/06/19 0230  NA 146* 138 141 145  K 3.2* 3.7 3.7 4.0  CL 115* 107 110 116*  CO2 24 20* 22 22  GLUCOSE 202* 160* 103* 206*  BUN 18 15 14 17   CREATININE 1.29* 1.17 1.03 0.89  CALCIUM 7.4* 7.5* 7.6* 8.2*  MG 2.4 2.0  --   --   PHOS 1.3*  --   --   --    GFR: Estimated Creatinine Clearance: 77.9 mL/min (by C-G formula based on SCr of 0.89 mg/dL).  Liver Function Tests: Recent Labs  Lab 05/01/19 0500 05/02/19 0527 05/03/19 0455 05/04/19 0400  AST 70* 50* 48* 36  ALT 49* 42 46* 38  ALKPHOS 50 48 58 57  BILITOT 1.0 1.3* 1.5* 1.2  PROT 6.2* 6.0* 6.4* 5.8*  ALBUMIN 2.5* 2.4* 2.5* 2.3*    HbA1C: Hgb A1c MFr Bld  Date/Time Value Ref Range Status  04/27/2019 06:09 AM 5.3 4.8 - 5.6 % Final    Comment:    (NOTE) Pre diabetes:          5.7%-6.4% Diabetes:              >6.4% Glycemic control for   <7.0% adults with diabetes   10/29/2018 04:53 AM 5.6 4.8 - 5.6 % Final    Comment:    (NOTE) Pre diabetes:          5.7%-6.4% Diabetes:              >6.4% Glycemic control for   <7.0% adults with diabetes     CBG: Recent Labs  Lab 05/06/19 0728 05/06/19 1115 05/06/19 1625 05/06/19 2020 05/07/19 0802  GLUCAP 190* 242* 196* 182* 141*    Recent Results (from the past 240 hour(s))  Culture, blood (routine x 2)     Status: None   Collection Time: 04/28/19 12:30 PM    Specimen: BLOOD  Result Value Ref Range Status   Specimen Description   Final    BLOOD RIGHT ANTECUBITAL Performed at [facility], [place] [address] [place], [place] [address]    Special Requests   Final    BOTTLES DRAWN AEROBIC ONLY Blood Culture adequate volume Performed at [facility] [address] [place], [place] [address]  Culture   Final    NO GROWTH 5 DAYS Performed at [facility], [place] [address] [place], [place] [address]    Report Status 05/03/2019 FINAL  Final  Culture, blood (routine x 2)     Status: None   Collection Time: 04/28/19 12:35 PM   Specimen: BLOOD  Result Value Ref Range Status   Specimen Description   Final    BLOOD RIGHT HAND Performed at [facility] [address], [place], [place] [address]    Special Requests   Final    BOTTLES DRAWN AEROBIC ONLY Blood Culture adequate volume Performed at [facility] [address], [place], [place] [address]    Culture   Final    NO GROWTH 5 DAYS Performed at [facility], [place] [address] [place], [place] [address]    Report Status 05/03/2019 FINAL  Final  MRSA PCR Screening     Status: Abnormal   Collection Time: 05/03/19 11:10 AM   Specimen: Nasopharyngeal  Result Value Ref Range Status   MRSA by PCR POSITIVE (A) NEGATIVE Final    Comment:        The GeneXpert MRSA Assay (FDA approved for NASAL specimens only), is one component of a comprehensive MRSA colonization surveillance program. It is not intended to diagnose MRSA infection nor to guide or monitor treatment for MRSA infections. RESULT CALLED TO, READ BACK BY AND VERIFIED WITH: TONING AT 1533 ON 05/03/2019 BY MOSLEY,J Performed at [facility], [place] [address], [place], [place] [address]   Culture, blood (routine x 2)     Status: None (Preliminary result)   Collection Time: 05/03/19 12:30 PM   Specimen: BLOOD  Result Value Ref  Range Status   Specimen Description   Final    BLOOD LEFT HAND Performed at [facility] [address], [place], [place] [address]    Special Requests   Final    BOTTLES DRAWN AEROBIC ONLY Blood Culture adequate volume Performed at [facility] [address], [place], [place] [address]    Culture   Final    NO GROWTH 4 DAYS Performed at [facility], [place] [address], [place], [place] [address]    Report Status PENDING  Incomplete  Culture, blood (routine x 2)     Status: None (Preliminary result)   Collection Time: 05/03/19 12:43 PM   Specimen: BLOOD  Result Value Ref Range Status   Specimen Description   Final    BLOOD LEFT ARM Performed at [facility] [address], [place], [place] [address]    Special Requests   Final    BOTTLES DRAWN AEROBIC ONLY Blood Culture adequate volume Performed at [facility] [address] [place], [place] [address]    Culture   Final    NO GROWTH 4 DAYS Performed at [facility], [place] [address], [place],  [address]    Report Status PENDING  Incomplete     Scheduled Meds:  amLODipine  10 mg Oral Daily   carbamazepine  300 mg Oral BID   Chlorhexidine Gluconate Cloth  6 each Topical Daily   docusate sodium  100 mg Oral BID   enoxaparin (LOVENOX) injection  40 mg Subcutaneous Q24H   insulin aspart  0-5 Units Subcutaneous QHS   insulin aspart  0-9 Units Subcutaneous TID WC   insulin detemir  10 Units Subcutaneous Daily   mupirocin  ointment   Nasal BID   Continuous Infusions:  vancomycin Stopped (05/06/19 2002)     LOS: 10 days   Cherene Altes, MD [facility] Office  [phone] Pager - Text Page per Amion  If 7PM-7AM, please contact night-coverage per Amion 05/07/2019, 9:12 AM

## 2019-05-07 NOTE — Progress Notes (Addendum)
[place] Report from [facility].  2010 Shift assessment and VS taken. VS WDL. Pt on room air. No fluids currently infusing. Right PICC double lumen. Call bell in reach. Bed alarmed.  0430 Bed bath, new condom cath placed, new gown and linens. Pt has one BM.  0700 Report given to [employer]

## 2019-05-07 NOTE — Progress Notes (Signed)
Initial Nutrition Assessment  INTERVENTION:   -Ensure Enlive po BID, each supplement provides 350 kcal and 20 grams of protein -Multivitamin with minerals daily  NUTRITION DIAGNOSIS:   Increased nutrient needs related to acute illness(COVID-19 infection) as evidenced by estimated needs.  GOAL:   Patient will meet greater than or equal to 90% of their needs  MONITOR:   PO intake, Supplement acceptance, Labs, Weight trends, I & O's  REASON FOR ASSESSMENT:   Malnutrition Screening Tool    ASSESSMENT:   69yo SNF resident with a history of DM, seizure disorder, cerebral palsy, and HTN who was originally seen in the ED 12/18 with slurred speech which resolved.  Following this initial visit he required multiple repeat visits to the ED with waxing and waning confusion and lethargy.  On 12/22 his Covid test was positive.  He returned to the ED the night of 12/23 with worsening somnolence and was found to have a temperature of 101.3.  CXR noted patchy opacity at the left lung base.  12/22: COVID+ 12/23: admitted for COVID-19 pneumonia  **RD working remotely**  Patient with altered mental status which is improving but only able to answer simple questions.  Pt has been refusing most intakes this admission. PO intakes have ranged 0-25%.  Per SLP note 12/29, recommended pt continue full liquids given lethargy and mental status. SLP to f/u. Will order Ensure supplements for additional kcals and protein.  Palliative care has been consulted, needs to discuss [place] with [facility] as pt is a ward of state.  Per weight records, pt weighed 169 lbs in June 2020. Admission weight: 180 lbs.  I/Os: -2.3L since admit UOP: 400 ml x 24 hrs  Medications reviewed. Labs reviewed: CBGs: 141-170  NUTRITION - FOCUSED PHYSICAL EXAM:  Working remotely.  Diet Order:   Diet Order            Diet full liquid Room service appropriate? Yes; Fluid consistency: Thin  Diet effective now               EDUCATION NEEDS:   No education needs have been identified at this time  Skin:  Skin Assessment: Reviewed RN Assessment  Last BM:  1/3 -type 6  Height:   Ht Readings from Last 1 Encounters:  04/26/19 5\' 8"  (1.727 m)    Weight:   Wt Readings from Last 1 Encounters:  04/26/19 82 kg    Ideal Body Weight:  70 kg  BMI:  Body mass index is 27.49 kg/m.  Estimated Nutritional Needs:   Kcal:  2100-2300  Protein:  100-115g  Fluid:  2L/day  Clayton Bibles, MS, RD, LDN Inpatient Clinical Dietitian Pager: [phone] After Hours Pager: [phone]

## 2019-05-08 LAB — GLUCOSE, CAPILLARY
Glucose-Capillary: 121 mg/dL — ABNORMAL HIGH (ref 70–99)
Glucose-Capillary: 185 mg/dL — ABNORMAL HIGH (ref 70–99)
Glucose-Capillary: 125 mg/dL — ABNORMAL HIGH (ref 70–99)
Glucose-Capillary: 100 mg/dL — ABNORMAL HIGH (ref 70–99)

## 2019-05-08 LAB — CULTURE, BLOOD (ROUTINE X 2)
Culture: NO GROWTH
Culture: NO GROWTH
Special Requests: ADEQUATE
Special Requests: ADEQUATE

## 2019-05-08 NOTE — Progress Notes (Signed)
No charge-  Called patient's DSS guardian- Jeremy Parks- left message requesting return call.   Mariana Kaufman, AGNP-C Palliative Medicine  Please call Palliative Medicine team phone with any questions [phone]. For individual providers please see AMION.

## 2019-05-08 NOTE — Progress Notes (Signed)
  Speech Language Pathology Treatment: Dysphagia  Patient Details Name: Jeremy Parks MRN: [id number] DOB: 12/21/1950 Today's Date: 05/08/2019 Time: 4098-1191 SLP Time Calculation (min) (ACUTE ONLY): 17 min  Assessment / Plan / Recommendation Clinical Impression  Pt is much more alert and interactive since last seen by SLP. He answered simple questions with some tendency to perseverate on responses.  He accepted and masticated portions of a breakfast bar with no further oral holding/residue, but intermittent coughing throughout.  Pudding and sips of thin liquid were managed with no overt s/s of aspiration.  He demonstrates multiple swallows with each bolus, inconsistent motor planning with mouth opening/oral manipulation, but he is functional and likely at baseline.  For now, advance to dysphagia 1, thin liquids with f/u recommendations for diet to be re-assessed and advanced at bedside once he is D/Cd to a SNF. No further acute care SLP needs are identified - our service will sign off.   HPI HPI: 69 year old male with past medical history of diabetes mellitus, seizure disorder, cerebral palsy and hypertension who resides in a local nursing home who had been seen in the emergency room on 12/18 for slurred speech which had resolved by the time the patient was in the emergency room itself.  He was sent back with no other findings.  However, he has since been to the emergency room several days for confusion or somnolence.  On 12/22 he had a Covid test which came back positive.      SLP Plan  All goals met       Recommendations  Diet recommendations: Dysphagia 1 (puree);Thin liquid Liquids provided via: Cup;Straw Medication Administration: Crushed with puree Supervision: Full supervision/cueing for compensatory strategies;Staff to assist with self feeding Compensations: Slow rate;Small sips/bites Postural Changes and/or Swallow Maneuvers: Seated upright 90 degrees;Upright 30-60 min after meal               Oral Care Recommendations: Oral care BID Follow up Recommendations: Skilled Nursing facility SLP Visit Diagnosis: Dysphagia, unspecified (R13.10) Plan: All goals met       GO                Jeremy Parks 05/08/2019, 10:31 AM  Estill Bamberg L. Tivis Ringer, [place] Office number [phone]

## 2019-05-08 NOTE — Progress Notes (Signed)
Jeremy Parks  [id number] DOB: 1951/02/14 DOA: 04/26/2019 PCP: Sinda Du, MD    Brief Narrative:  69yo SNF resident with a history of DM, seizure disorder, cerebral palsy, and HTN who was originally seen in the ED 12/18 with slurred speech which resolved.  Following this initial visit he required multiple repeat visits to the ED with waxing and waning confusion and lethargy.  On 12/22 his Covid test was positive.  He returned to the ED the night of 12/23 with worsening somnolence and was found to have a temperature of 101.3.  CXR noted patchy opacity at the left lung base.  He was admitted.  Significant Events: 12/18 ED visit with slurred speech 12/22 Covid test positive 12/23 admit via [facility] - transfer to [facility] 12/30 episode of desaturation w/ fever, hypotension, tachycardia   Devices: RUE PICC  COVID-19 specific Treatment: Remdesivir 12/24 > 12/28 Actemra 12/25 Decadron 12/24 > 05/05/2018  Antimicrobials:  Rocephin 12/24 Cefepime 12/25 Zosyn 12/26 > 12/30 Vancomycin 12/30 > 1/3  Subjective: Remains stable today.  No evidence of respiratory distress or uncontrolled pain.  Is alert and answers my questions though slowly.  Assessment & Plan:  FUO - Sepsis  Volume resuscitated - sepsis resolved - probable recurrent aspiration event - pt completed 6 days of Zosyn, and developed fever near end of course of Zosyn - cont Vanc to complete a 5 day course (MRSA screen +) - repeat blood cx no growth x4 days thus far   COVID Pneumonia - acute hypoxic respiratory failure was dosed with Actemra and remdesivir and a 10 day course of decadron - has been weaned to RA - this does not appear to have been the primary issue driving his presentation - f/u CXR 12/30 w/o signif change in his pulm infiltrates   Aspiration pneumonia Competed a course of Zosyn - thin liquids per SLP recommendation - more alert now and did well w/ SLP so diet advanced today to D1 w/ thin liquids   Acute  kidney injury Resolved with volume resuscitation  Hypokalemia  Corrected w/ supplementation   Acute urinary retention Foley catheter placed - added urecholine 12/29 and then foley able to be removed - no evidence of recurrence thus far   Proteus UTI Greater than 100,000 colonies in urine culture dated 12/24 - pan-sensisitve per culture data - has completed a course of abx tx   DM2 CBG controlled w/ pt now off steroids   Acute delirium - altered mental status Unclear baseline -mental status waxing and waning but does appear to be improving at this time - will respond to simple questions appropriately   Hypernatremia Corrected w/ free water admin   Transaminitis likely due to Covid plus/minus remdesivir dosing - LFTs have returned to normal  Normocytic anemia anemia panel consistent with anemia of chronic disease/poor nutrition  Seizure disorder Cont usual meds   HTN BP reasonably controlled   HLD Continue statin  Goals of Care His perceived quality of life appear quite low to me - he is refusing oral intake, and intermittently decompensating due to medical complications - I feel a NCB status, and perhaps limited interventions would be in his best interest - his situation is complex as he is not able to make his own decsions - I will ask Palliative Care to assist   DVT prophylaxis: Lovenox Code Status: FULL CODE Family Communication:  Disposition Plan: MedSurg bed - medically cleared for return to SNF as soon as bed procured  Consultants:  none  Objective: Blood pressure (!) 170/98, pulse 84, temperature 97.7 F (36.5 C), temperature source Axillary, resp. rate 17, height 5\' 8"  (1.727 m), weight 82 kg, SpO2 100 %.  Intake/Output Summary (Last 24 hours) at 05/08/2019 1728 Last data filed at 05/08/2019 1200 Gross per 24 hour  Intake 240 ml  Output 900 ml  Net -660 ml   Filed Weights   04/26/19 1925  Weight: 82 kg    Examination: General: NAD Lungs: no focal  crackles - poor air movement B bases Cardiovascular: RRR Abdomen: NT/ND, soft, bs+ Extremities: No C/C/E B LE  CBC: Recent Labs  Lab 05/03/19 0455 05/04/19 0400 05/06/19 0230  WBC 12.5* 8.9 14.0*  HGB 10.7* 9.3* 9.6*  HCT 32.3* 28.2* 29.8*  MCV 88.3 89.2 88.4  PLT 300 269 [id number]   Basic Metabolic Panel: Recent Labs  Lab 05/03/19 0455 05/04/19 0400 05/06/19 0230  NA 138 141 145  K 3.7 3.7 4.0  CL 107 110 116*  CO2 20* 22 22  GLUCOSE 160* 103* 206*  BUN 15 14 17   CREATININE 1.17 1.03 0.89  CALCIUM 7.5* 7.6* 8.2*  MG 2.0  --   --    GFR: Estimated Creatinine Clearance: 77.9 mL/min (by C-G formula based on SCr of 0.89 mg/dL).  Liver Function Tests: Recent Labs  Lab 05/02/19 0527 05/03/19 0455 05/04/19 0400  AST 50* 48* 36  ALT 42 46* 38  ALKPHOS 48 58 57  BILITOT 1.3* 1.5* 1.2  PROT 6.0* 6.4* 5.8*  ALBUMIN 2.4* 2.5* 2.3*    HbA1C: Hgb A1c MFr Bld  Date/Time Value Ref Range Status  04/27/2019 06:09 AM 5.3 4.8 - 5.6 % Final    Comment:    (NOTE) Pre diabetes:          5.7%-6.4% Diabetes:              >6.4% Glycemic control for   <7.0% adults with diabetes   10/29/2018 04:53 AM 5.6 4.8 - 5.6 % Final    Comment:    (NOTE) Pre diabetes:          5.7%-6.4% Diabetes:              >6.4% Glycemic control for   <7.0% adults with diabetes     CBG: Recent Labs  Lab 05/07/19 1124 05/07/19 1705 05/07/19 1926 05/08/19 0842 05/08/19 1303  GLUCAP 170* 139* 148* 121* 185*    Recent Results (from the past 240 hour(s))  MRSA PCR Screening     Status: Abnormal   Collection Time: 05/03/19 11:10 AM   Specimen: Nasopharyngeal  Result Value Ref Range Status   MRSA by PCR POSITIVE (A) NEGATIVE Final    Comment:        The GeneXpert MRSA Assay (FDA approved for NASAL specimens only), is one component of a comprehensive MRSA colonization surveillance program. It is not intended to diagnose MRSA infection nor to guide or monitor treatment for MRSA  infections. RESULT CALLED TO, READ BACK BY AND VERIFIED WITH: TONING AT 1533 ON 05/03/2019 BY MOSLEY,J Performed at [facility], [place] [address], [place], [place] [address]   Culture, blood (routine x 2)     Status: None   Collection Time: 05/03/19 12:30 PM   Specimen: BLOOD  Result Value Ref Range Status   Specimen Description   Final    BLOOD LEFT HAND Performed at [facility] [address], [place], [place] [address]    Special Requests  Final    BOTTLES DRAWN AEROBIC ONLY Blood Culture adequate volume Performed at [facility] [address], [place], [place] [address]    Culture   Final    NO GROWTH 5 DAYS Performed at [facility], [place] [address] [place], [place] [address]    Report Status 05/08/2019 FINAL  Final  Culture, blood (routine x 2)     Status: None   Collection Time: 05/03/19 12:43 PM   Specimen: BLOOD  Result Value Ref Range Status   Specimen Description   Final    BLOOD LEFT ARM Performed at [facility] [address] [place], [place] [address]    Special Requests   Final    BOTTLES DRAWN AEROBIC ONLY Blood Culture adequate volume Performed at [facility] [address], [place], [place] [address]    Culture   Final    NO GROWTH 5 DAYS Performed at [facility], [place] [address], [place], [place] [address]    Report Status 05/08/2019 FINAL  Final     Scheduled Meds: . amLODipine  10 mg Oral Daily  . carbamazepine  300 mg Oral BID  . Chlorhexidine Gluconate Cloth  6 each Topical Daily  . docusate sodium  100 mg Oral BID  . enoxaparin (LOVENOX) injection  40 mg Subcutaneous Q24H  . feeding supplement (ENSURE ENLIVE)  237 mL Oral BID BM  . insulin aspart  0-5 Units Subcutaneous QHS  . insulin aspart  0-9 Units Subcutaneous TID WC  . insulin detemir  10 Units Subcutaneous Daily  . multivitamin with minerals  1 tablet Oral Daily  .  mupirocin ointment   Nasal BID      LOS: 11 days   Cherene Altes, MD [facility] Office  [phone] Pager - Text Page per Amion  If 7PM-7AM, please contact night-coverage per Amion 05/08/2019, 5:28 PM

## 2019-05-09 DIAGNOSIS — G809 Cerebral palsy, unspecified: Secondary | ICD-10-CM | POA: Diagnosis not present

## 2019-05-09 DIAGNOSIS — M6281 Muscle weakness (generalized): Secondary | ICD-10-CM | POA: Diagnosis not present

## 2019-05-09 DIAGNOSIS — I10 Essential (primary) hypertension: Secondary | ICD-10-CM | POA: Diagnosis not present

## 2019-05-09 DIAGNOSIS — J9601 Acute respiratory failure with hypoxia: Secondary | ICD-10-CM | POA: Diagnosis not present

## 2019-05-09 DIAGNOSIS — R569 Unspecified convulsions: Secondary | ICD-10-CM | POA: Diagnosis not present

## 2019-05-09 DIAGNOSIS — N390 Urinary tract infection, site not specified: Secondary | ICD-10-CM

## 2019-05-09 DIAGNOSIS — Z515 Encounter for palliative care: Secondary | ICD-10-CM | POA: Diagnosis not present

## 2019-05-09 DIAGNOSIS — Z7401 Bed confinement status: Secondary | ICD-10-CM | POA: Diagnosis not present

## 2019-05-09 DIAGNOSIS — R402411 Glasgow coma scale score 13-15, in the field [EMT or ambulance]: Secondary | ICD-10-CM | POA: Diagnosis not present

## 2019-05-09 DIAGNOSIS — J1281 Pneumonia due to SARS-associated coronavirus: Secondary | ICD-10-CM | POA: Diagnosis not present

## 2019-05-09 DIAGNOSIS — D649 Anemia, unspecified: Secondary | ICD-10-CM | POA: Diagnosis not present

## 2019-05-09 DIAGNOSIS — J9600 Acute respiratory failure, unspecified whether with hypoxia or hypercapnia: Secondary | ICD-10-CM | POA: Diagnosis not present

## 2019-05-09 DIAGNOSIS — A419 Sepsis, unspecified organism: Secondary | ICD-10-CM | POA: Diagnosis not present

## 2019-05-09 DIAGNOSIS — R41841 Cognitive communication deficit: Secondary | ICD-10-CM | POA: Diagnosis not present

## 2019-05-09 DIAGNOSIS — E119 Type 2 diabetes mellitus without complications: Secondary | ICD-10-CM | POA: Diagnosis not present

## 2019-05-09 DIAGNOSIS — E785 Hyperlipidemia, unspecified: Secondary | ICD-10-CM | POA: Diagnosis not present

## 2019-05-09 DIAGNOSIS — U071 COVID-19: Secondary | ICD-10-CM | POA: Diagnosis not present

## 2019-05-09 DIAGNOSIS — J690 Pneumonitis due to inhalation of food and vomit: Secondary | ICD-10-CM | POA: Diagnosis not present

## 2019-05-09 DIAGNOSIS — R319 Hematuria, unspecified: Secondary | ICD-10-CM

## 2019-05-09 DIAGNOSIS — N179 Acute kidney failure, unspecified: Secondary | ICD-10-CM | POA: Diagnosis not present

## 2019-05-09 DIAGNOSIS — E870 Hyperosmolality and hypernatremia: Secondary | ICD-10-CM | POA: Diagnosis not present

## 2019-05-09 DIAGNOSIS — Z7189 Other specified counseling: Secondary | ICD-10-CM | POA: Diagnosis not present

## 2019-05-09 DIAGNOSIS — R262 Difficulty in walking, not elsewhere classified: Secondary | ICD-10-CM | POA: Diagnosis not present

## 2019-05-09 DIAGNOSIS — E876 Hypokalemia: Secondary | ICD-10-CM | POA: Diagnosis not present

## 2019-05-09 DIAGNOSIS — R410 Disorientation, unspecified: Secondary | ICD-10-CM

## 2019-05-09 DIAGNOSIS — M2550 Pain in unspecified joint: Secondary | ICD-10-CM | POA: Diagnosis not present

## 2019-05-09 DIAGNOSIS — R1312 Dysphagia, oropharyngeal phase: Secondary | ICD-10-CM | POA: Diagnosis not present

## 2019-05-09 DIAGNOSIS — R4182 Altered mental status, unspecified: Secondary | ICD-10-CM | POA: Diagnosis not present

## 2019-05-09 LAB — GLUCOSE, CAPILLARY
Glucose-Capillary: 99 mg/dL (ref 70–99)
Glucose-Capillary: 136 mg/dL — ABNORMAL HIGH (ref 70–99)
Glucose-Capillary: 157 mg/dL — ABNORMAL HIGH (ref 70–99)

## 2019-05-09 MED ORDER — ACETAMINOPHEN 325 MG PO TABS: 650.0000 mg | ORAL_TABLET | Freq: Four times a day (QID) | ORAL | Status: DC | PRN

## 2019-05-09 MED ORDER — BASAGLAR KWIKPEN 100 UNIT/ML ~~LOC~~ SOPN: 10.0000 [IU] | PEN_INJECTOR | Freq: Every day | SUBCUTANEOUS | 1 refills | Status: DC

## 2019-05-09 NOTE — Progress Notes (Signed)
Offered to help patient eat his breakfast, which he refused at this time. Will follow up closer to 1000 and see if he would like to eat then.

## 2019-05-09 NOTE — Discharge Instructions (Signed)
Date of Positive COVID Test: 04/25/2019  Date Quarantine Ends: 05/16/2019    COVID-19 COVID-19 is a respiratory infection that is caused by a virus called severe acute respiratory syndrome coronavirus 2 (SARS-CoV-2). The disease is also known as coronavirus disease or novel coronavirus. In some people, the virus may not cause any symptoms. In others, it may cause a serious infection. The infection can get worse quickly and can lead to complications, such as:  Pneumonia, or infection of the lungs.  Acute respiratory distress syndrome or ARDS. This is a condition in which fluid build-up in the lungs prevents the lungs from filling with air and passing oxygen into the blood.  Acute respiratory failure. This is a condition in which there is not enough oxygen passing from the lungs to the body or when carbon dioxide is not passing from the lungs out of the body.  Sepsis or septic shock. This is a serious bodily reaction to an infection.  Blood clotting problems.  Secondary infections due to bacteria or fungus.  Organ failure. This is when your body's organs stop working. The virus that causes COVID-19 is contagious. This means that it can spread from person to person through droplets from coughs and sneezes (respiratory secretions). What are the causes? This illness is caused by a virus. You may catch the virus by:  Breathing in droplets from an infected person. Droplets can be spread by a person breathing, speaking, singing, coughing, or sneezing.  Touching something, like a table or a doorknob, that was exposed to the virus (contaminated) and then touching your mouth, nose, or eyes. What increases the risk? Risk for infection You are more likely to be infected with this virus if you:  Are within 6 feet (2 meters) of a person with COVID-19.  Provide care for or live with a person who is infected with COVID-19.  Spend time in crowded indoor spaces or live in shared housing. Risk for  serious illness You are more likely to become seriously ill from the virus if you:  Are 68 years of age or older. The higher your age, the more you are at risk for serious illness.  Live in a nursing home or long-term care facility.  Have cancer.  Have a long-term (chronic) disease such as: ? Chronic lung disease, including chronic obstructive pulmonary disease or asthma. ? A long-term disease that lowers your body's ability to fight infection (immunocompromised). ? Heart disease, including heart failure, a condition in which the arteries that lead to the heart become narrow or blocked (coronary artery disease), a disease which makes the heart muscle thick, weak, or stiff (cardiomyopathy). ? Diabetes. ? Chronic kidney disease. ? Sickle cell disease, a condition in which red blood cells have an abnormal "sickle" shape. ? Liver disease.  Are obese. What are the signs or symptoms? Symptoms of this condition can range from mild to severe. Symptoms may appear any time from 2 to 14 days after being exposed to the virus. They include:  A fever or chills.  A cough.  Difficulty breathing.  Headaches, body aches, or muscle aches.  Runny or stuffy (congested) nose.  A sore throat.  New loss of taste or smell. Some people may also have stomach problems, such as nausea, vomiting, or diarrhea. Other people may not have any symptoms of COVID-19. How is this diagnosed? This condition may be diagnosed based on:  Your signs and symptoms, especially if: ? You live in an area with a COVID-19 outbreak. ? You  recently traveled to or from an area where the virus is common. ? You provide care for or live with a person who was diagnosed with COVID-19. ? You were exposed to a person who was diagnosed with COVID-19.  A physical exam.  Lab tests, which may include: ? Taking a sample of fluid from the back of your nose and throat (nasopharyngeal fluid), your nose, or your throat using a  swab. ? A sample of mucus from your lungs (sputum). ? Blood tests.  Imaging tests, which may include, X-rays, CT scan, or ultrasound. How is this treated? At present, there is no medicine to treat COVID-19. Medicines that treat other diseases are being used on a trial basis to see if they are effective against COVID-19. Your health care provider will talk with you about ways to treat your symptoms. For most people, the infection is mild and can be managed at home with rest, fluids, and over-the-counter medicines. Treatment for a serious infection usually takes places in a hospital intensive care unit (ICU). It may include one or more of the following treatments. These treatments are given until your symptoms improve.  Receiving fluids and medicines through an IV.  Supplemental oxygen. Extra oxygen is given through a tube in the nose, a face mask, or a hood.  Positioning you to lie on your stomach (prone position). This makes it easier for oxygen to get into the lungs.  Continuous positive airway pressure (CPAP) or bi-level positive airway pressure (BPAP) machine. This treatment uses mild air pressure to keep the airways open. A tube that is connected to a motor delivers oxygen to the body.  Ventilator. This treatment moves air into and out of the lungs by using a tube that is placed in your windpipe.  Tracheostomy. This is a procedure to create a hole in the neck so that a breathing tube can be inserted.  Extracorporeal membrane oxygenation (ECMO). This procedure gives the lungs a chance to recover by taking over the functions of the heart and lungs. It supplies oxygen to the body and removes carbon dioxide. Follow these instructions at home: Lifestyle  If you are sick, stay home except to get medical care. Your health care provider will tell you how long to stay home. Call your health care provider before you go for medical care.  Rest at home as told by your health care provider.  Do  not use any products that contain nicotine or tobacco, such as cigarettes, e-cigarettes, and chewing tobacco. If you need help quitting, ask your health care provider.  Return to your normal activities as told by your health care provider. Ask your health care provider what activities are safe for you. General instructions  Take over-the-counter and prescription medicines only as told by your health care provider.  Drink enough fluid to keep your urine pale yellow.  Keep all follow-up visits as told by your health care provider. This is important. How is this prevented?  There is no vaccine to help prevent COVID-19 infection. However, there are steps you can take to protect yourself and others from this virus. To protect yourself:   Do not travel to areas where COVID-19 is a risk. The areas where COVID-19 is reported change often. To identify high-risk areas and travel restrictions, check the CDC travel website: FatFares.com.br  If you live in, or must travel to, an area where COVID-19 is a risk, take precautions to avoid infection. ? Stay away from people who are sick. ? Wash  your hands often with soap and water for 20 seconds. If soap and water are not available, use an alcohol-based hand sanitizer. ? Avoid touching your mouth, face, eyes, or nose. ? Avoid going out in public, follow guidance from your state and local health authorities. ? If you must go out in public, wear a cloth face covering or face mask. Make sure your mask covers your nose and mouth. ? Avoid crowded indoor spaces. Stay at least 6 feet (2 meters) away from others. ? Disinfect objects and surfaces that are frequently touched every day. This may include:  Counters and tables.  Doorknobs and light switches.  Sinks and faucets.  Electronics, such as phones, remote controls, keyboards, computers, and tablets. To protect others: If you have symptoms of COVID-19, take steps to prevent the virus from  spreading to others.  If you think you have a COVID-19 infection, contact your health care provider right away. Tell your health care team that you think you may have a COVID-19 infection.  Stay home. Leave your house only to seek medical care. Do not use public transport.  Do not travel while you are sick.  Wash your hands often with soap and water for 20 seconds. If soap and water are not available, use alcohol-based hand sanitizer.  Stay away from other members of your household. Let healthy household members care for children and pets, if possible. If you have to care for children or pets, wash your hands often and wear a mask. If possible, stay in your own room, separate from others. Use a different bathroom.  Make sure that all people in your household wash their hands well and often.  Cough or sneeze into a tissue or your sleeve or elbow. Do not cough or sneeze into your hand or into the air.  Wear a cloth face covering or face mask. Make sure your mask covers your nose and mouth. Where to find more information  Centers for Disease Control and Prevention: PurpleGadgets.be  World Health Organization: https://www.castaneda.info/ Contact a health care provider if:  You live in or have traveled to an area where COVID-19 is a risk and you have symptoms of the infection.  You have had contact with someone who has COVID-19 and you have symptoms of the infection. Get help right away if:  You have trouble breathing.  You have pain or pressure in your chest.  You have confusion.  You have bluish lips and fingernails.  You have difficulty waking from sleep.  You have symptoms that get worse. These symptoms may represent a serious problem that is an emergency. Do not wait to see if the symptoms will go away. Get medical help right away. Call your local emergency services (911 in the [place] Do not drive yourself to the hospital. Let the  emergency medical personnel know if you think you have COVID-19. Summary  COVID-19 is a respiratory infection that is caused by a virus. It is also known as coronavirus disease or novel coronavirus. It can cause serious infections, such as pneumonia, acute respiratory distress syndrome, acute respiratory failure, or sepsis.  The virus that causes COVID-19 is contagious. This means that it can spread from person to person through droplets from breathing, speaking, singing, coughing, or sneezing.  You are more likely to develop a serious illness if you are 25 years of age or older, have a weak immune system, live in a nursing home, or have chronic disease.  There is no medicine to treat COVID-19.  Your health care provider will talk with you about ways to treat your symptoms.  Take steps to protect yourself and others from infection. Wash your hands often and disinfect objects and surfaces that are frequently touched every day. Stay away from people who are sick and wear a mask if you are sick. This information is not intended to replace advice given to you by your health care provider. Make sure you discuss any questions you have with your health care provider. Document Revised: 02/17/2019 Document Reviewed: 05/26/2018 Elsevier Patient Education  2020 [place].   COVID-19: How to Protect Yourself and Others Know how it spreads  There is currently no vaccine to prevent coronavirus disease 2019 (COVID-19).  The best way to prevent illness is to avoid being exposed to this virus.  The virus is thought to spread mainly from person-to-person. ? Between people who are in close contact with one another (within about 6 feet). ? Through respiratory droplets produced when an infected person coughs, sneezes or talks. ? These droplets can land in the mouths or noses of people who are nearby or possibly be inhaled into the lungs. ? COVID-19 may be spread by people who are not showing  symptoms. Everyone should Clean your hands often  Wash your hands often with soap and water for at least 20 seconds especially after you have been in a public place, or after blowing your nose, coughing, or sneezing.  If soap and water are not readily available, use a hand sanitizer that contains at least 60% alcohol. Cover all surfaces of your hands and rub them together until they feel dry.  Avoid touching your eyes, nose, and mouth with unwashed hands. Avoid close contact  Limit contact with others as much as possible.  Avoid close contact with people who are sick.  Put distance between yourself and other people. ? Remember that some people without symptoms may be able to spread virus. ? This is especially important for people who are at higher risk of getting very GainPain.com.cy Cover your mouth and nose with a mask when around others  You could spread COVID-19 to others even if you do not feel sick.  Everyone should wear a mask in public settings and when around people not living in their household, especially when social distancing is difficult to maintain. ? Masks should not be placed on young children under age 15, anyone who has trouble breathing, or is unconscious, incapacitated or otherwise unable to remove the mask without assistance.  The mask is meant to protect other people in case you are infected.  Do NOT use a facemask meant for a Dietitian.  Continue to keep about 6 feet between yourself and others. The mask is not a substitute for social distancing. Cover coughs and sneezes  Always cover your mouth and nose with a tissue when you cough or sneeze or use the inside of your elbow.  Throw used tissues in the trash.  Immediately wash your hands with soap and water for at least 20 seconds. If soap and water are not readily available, clean your hands with a hand sanitizer that contains  at least 60% alcohol. Clean and disinfect  Clean AND disinfect frequently touched surfaces daily. This includes tables, doorknobs, light switches, countertops, handles, desks, phones, keyboards, toilets, faucets, and sinks. RackRewards.fr  If surfaces are dirty, clean them: Use detergent or soap and water prior to disinfection.  Then, use a household disinfectant. You can see a list of EPA-registered household  disinfectants here. michellinders.com 01/04/2019 This information is not intended to replace advice given to you by your health care provider. Make sure you discuss any questions you have with your health care provider. Document Revised: 01/12/2019 Document Reviewed: 11/10/2018 Elsevier Patient Education  [place].

## 2019-05-09 NOTE — Progress Notes (Signed)
Pt did well overnight. Remains on room air with oxygen saturation >92%. Dry, non-productive cough. Pt with poor appetite and slow to swallow foods/medication. Did have low grade fever with T max overnight of 99.6.

## 2019-05-09 NOTE — Discharge Summary (Signed)
DISCHARGE SUMMARY  Jeremy Parks  MR#: [id number]  DOB:[birth year]  Date of Admission: 04/26/2019 Date of Discharge: 05/09/2019  Attending Physician:Reagann Dolce Hennie Duos, MD  Patient's [id number], Percell Miller, MD  Consults: none   Disposition: D/C to SNF  Date of Positive COVID Test: 04/25/2019  Date Quarantine Ends: 05/16/2019  COVID-19 specific Treatment: Remdesivir 12/24 > 12/28 Actemra 12/25 Decadron 12/24 > 05/05/2018  Antimicrobials:  Rocephin 12/24 Cefepime 12/25 Zosyn 12/26 > 12/30 Vancomycin 12/30 > 1/3  Diet: D1 w/ thin liquids   Follow-up Appts:  Contact information for follow-up providers    Sinda Du, MD Follow up in 1 week(s).   Specialty: Pulmonary Disease Contact information: [place] [address] [phone]            Contact information for after-discharge care    [facility] .   Service: Skilled Nursing Contact information: [address] [place] [address] [phone]                  Tests Needing Follow-up: -assess BP control  -encourage increased oral intake  -consider Palliative Care consult for goals of care   Discharge Diagnoses: Fever of unknown origin Sepsis of unknown origin   COVID Pneumonia Acute hypoxic respiratory failure Aspiration pneumonia Acute kidney injury Hypokalemia  Acute urinary retention Proteus UTI DM2 Acute delirium - altered mental status Hypernatremia Transaminitis Normocytic anemia Seizure disorder HTN HLD Goals of Care discussion underway   Initial presentation: 69yo SNF resident with a history of DM, seizure disorder, cerebral palsy, and HTN who was originally seen in the ED 12/18 with slurred speech which resolved. Following this initial visit he required multiple repeat visits to the ED with waxing and waning confusion and lethargy. On 12/22 his Covid test was positive. He returned to the ED the night  of 12/23 with worsening somnolence and was found to have a temperature of 101.3. CXR noted patchy opacity at the left lung base. He was admitted.  Hospital Course: 12/18 ED visit with slurred speech 12/22 Covid test positive 12/23 admit via [facility] - transfer to [facility] 12/30 episode of desaturation w/ fever, hypotension, tachycardia  1/5 ready for D/C to SNF   FUO - Sepsis  Volume resuscitated - sepsis resolved - probable recurrent aspiration events occured - pt completed 6 days of Zosyn, and developed fever near end of course of Zosyn - continued Vanc to complete a 5 day course (MRSA screen +) - repeat blood cx no growth x5 days - afebrile at time of d/c   COVID Pneumonia - acute hypoxic respiratory failure was dosed with Actemra and remdesivir and a 10 day course of decadron - has been weaned to RA - this does not appear to have been the primary issue driving his presentation - f/u CXR 12/30 w/o signif change in his pulm infiltrates   Aspiration pneumonia Competed a course of Zosyn - thin liquids only initially offered per SLP recommendation - more alert in later portion of admit and did well w/ SLP so diet advanced to D1 w/ thin liquids   Acute kidney injury Resolved with volume resuscitation  Hypokalemia  Corrected w/ supplementation   Acute urinary retention Foley catheter placed - added urecholine 12/29 and then foley able to be removed - no evidence of recurrence thus far   Proteus UTI Greater than 100,000 colonies in urine culture dated 12/24 - pan-sensisitve per culture data - has completed a course of  abx tx   DM2 CBG controlled at time of d/c   Acute delirium - altered mental status Unclear baseline -mental status waxing and waning but is consistently improving at time of d/c - will respond to simple questions appropriately   Hypernatremia Corrected w/ free water admin   Transaminitis likely due to Covid plus/minus remdesivir dosing - LFTs have returned to  normal  Normocytic anemia anemia panel consistent with anemia of chronic disease/poor nutrition  Seizure disorder Cont usual meds   HTN BP reasonably controlled but further titration of meds may be indicated in follow-up visits  HLD Continue statin  Goals of Care His perceived quality of life appear quite low to me - he at times refused oral intake, and intermittently decompensating due to medical complications - I feel a NCB status, and perhaps limited interventions would be in his best interest - his situation is complex as he is not able to make his own decsions - Palliative Care consult in the facility should be considered   Allergies as of 05/09/2019   No Known Allergies     Medication List    STOP taking these medications   fluticasone 50 MCG/ACT nasal spray Commonly known as: FLONASE     TAKE these medications   acetaminophen 325 MG tablet Commonly known as: TYLENOL Take 2 tablets (650 mg total) by mouth every 6 (six) hours as needed for mild pain, fever or headache. What changed:   medication strength  how much to take  when to take this  reasons to take this   amLODipine 10 MG tablet Commonly known as: NORVASC Take 1 tablet (10 mg total) by mouth daily.   atorvastatin 40 MG tablet Commonly known as: LIPITOR Take 40 mg by mouth at bedtime.   Basaglar KwikPen 100 UNIT/ML Sopn Inject 0.1 mLs (10 Units total) into the skin at bedtime. What changed:   medication strength  how much to take   carbamazepine 200 MG tablet Commonly known as: TEGretol Take 1.5 tablets (300 mg total) by mouth 2 (two) times daily.   chlorhexidine 0.12 % solution Commonly known as: PERIDEX Use as directed 15 mLs in the mouth or throat daily. **Swish 15 mls for 30 seconds then spit out. USE DAILY**   docusate sodium 100 MG capsule Commonly known as: COLACE Take 100 mg by mouth 2 (two) times daily.   nystatin powder Commonly known as: MYCOSTATIN/NYSTOP Apply  topically 4 (four) times daily. Apply topically to affected area of groin twice daily as needed for rash.       Day of Discharge BP (!) 156/84   Pulse 93   Temp 99.7 F (37.6 C) (Axillary)   Resp 18   Ht 5\' 8"  (1.727 m)   Wt 82 kg   SpO2 100%   BMI 27.49 kg/m   Physical Exam: General: No acute respiratory distress Lungs: Clear to auscultation bilaterally without wheezes or crackles Cardiovascular: Regular rate and rhythm without murmur gallop or rub normal S1 and S2 Abdomen: Nontender, nondistended, soft, bowel sounds positive, no rebound, no ascites, no appreciable mass Extremities: No significant cyanosis, clubbing, or edema bilateral lower extremities  Basic Metabolic Panel: Recent Labs  Lab 05/03/19 0455 05/04/19 0400 05/06/19 0230  NA 138 141 145  K 3.7 3.7 4.0  CL 107 110 116*  CO2 20* 22 22  GLUCOSE 160* 103* 206*  BUN 15 14 17   CREATININE 1.17 1.03 0.89  CALCIUM 7.5* 7.6* 8.2*  MG 2.0  --   --  Liver Function Tests: Recent Labs  Lab 05/03/19 0455 05/04/19 0400  AST 48* 36  ALT 46* 38  ALKPHOS 58 57  BILITOT 1.5* 1.2  PROT 6.4* 5.8*  ALBUMIN 2.5* 2.3*    CBC: Recent Labs  Lab 05/03/19 0455 05/04/19 0400 05/06/19 0230  WBC 12.5* 8.9 14.0*  HGB 10.7* 9.3* 9.6*  HCT 32.3* 28.2* 29.8*  MCV 88.3 89.2 88.4  PLT 300 269 321    BNP (last 3 results) Recent Labs    04/28/19 1230  BNP 69.2    CBG: Recent Labs  Lab 05/08/19 1303 05/08/19 1731 05/08/19 2055 05/09/19 0728 05/09/19 1143  GLUCAP 185* 125* 100* 99 136*    Recent Results (from the past 240 hour(s))  MRSA PCR Screening     Status: Abnormal   Collection Time: 05/03/19 11:10 AM   Specimen: Nasopharyngeal  Result Value Ref Range Status   MRSA by PCR POSITIVE (A) NEGATIVE Final    Comment:        The GeneXpert MRSA Assay (FDA approved for NASAL specimens only), is one component of a comprehensive MRSA colonization surveillance program. It is not intended to diagnose  MRSA infection nor to guide or monitor treatment for MRSA infections. RESULT CALLED TO, READ BACK BY AND VERIFIED WITH: TONING AT 1533 ON 05/03/2019 BY MOSLEY,J Performed at [facility], [place] [address], [place], [place] [address]   Culture, blood (routine x 2)     Status: None   Collection Time: 05/03/19 12:30 PM   Specimen: BLOOD  Result Value Ref Range Status   Specimen Description   Final    BLOOD LEFT HAND Performed at [facility] [address] [place], [place] [address]    Special Requests   Final    BOTTLES DRAWN AEROBIC ONLY Blood Culture adequate volume Performed at [facility] [address], [place], [place] [address]    Culture   Final    NO GROWTH 5 DAYS Performed at [facility], [place] [address] [place], [place] [address]    Report Status 05/08/2019 FINAL  Final  Culture, blood (routine x 2)     Status: None   Collection Time: 05/03/19 12:43 PM   Specimen: BLOOD  Result Value Ref Range Status   Specimen Description   Final    BLOOD LEFT ARM Performed at [facility] [address], [place], [place] [address]    Special Requests   Final    BOTTLES DRAWN AEROBIC ONLY Blood Culture adequate volume Performed at [facility] [address], [place], [place] [address]    Culture   Final    NO GROWTH 5 DAYS Performed at [facility], [place] [address] [place], [place] [address]    Report Status 05/08/2019 FINAL  Final     Time spent in discharge (includes decision making & examination of pt): 35 minutes  05/09/2019, 2:08 PM   Cherene Altes, MD [facility] Office  [phone]

## 2019-05-09 NOTE — TOC Initial Note (Signed)
Transition of Care [facility]) - Initial/Assessment Note    Patient Details  Name: Jeremy Parks MRN: [id number] Date of Birth: 1950-09-29  Transition of Care [facility]) CM/SW Contact:    Shade Flood, LCSW Phone Number: 05/09/2019, 9:49 AM  Clinical Narrative:                  Pt medically stable for dc per MD. Pt admitted from [facility]. Recommendation is for SNF at this time. Spoke with pt's guardian, Ulice Bold, at [place] to update. Tiara is in agreement with SNF referrals to facilities accepting Covid positive patients.  Will refer out and continue to assist with dc planning needs.  Expected Discharge Plan: Skilled Nursing Facility Barriers to Discharge: SNF Pending bed offer   Patient Goals and CMS Choice     Choice offered to / list presented to : [place] / Guardian  Expected Discharge Plan and Services Expected Discharge Plan: [place] In-house Referral: Clinical Social Work   Post Acute Care Choice: [place] Living arrangements for the past 2 months: [place] Expected Discharge Date: 05/09/19                                    Prior Living Arrangements/Services Living arrangements for the past 2 months: [place] Lives with:: Facility Resident Patient language and need for interpreter reviewed:: Yes Do you feel safe going back to the place where you live?: Yes      Need for Family Participation in Patient Care: No (Comment) Care giver support system in place?: Yes (comment)   Criminal Activity/Legal Involvement Pertinent to Current Situation/Hospitalization: No - Comment as needed  Activities of Daily Living Home Assistive Devices/Equipment: None ADL Screening (condition at time of admission) Patient's cognitive ability adequate to safely complete daily activities?: No Is the patient deaf or have difficulty hearing?: Yes Does the patient have difficulty seeing, even when wearing  glasses/contacts?: No Does the patient have difficulty concentrating, remembering, or making decisions?: Yes Patient able to express need for assistance with ADLs?: No Does the patient have difficulty dressing or bathing?: Yes Independently performs ADLs?: No Communication: Dependent Is this a change from baseline?: Change from baseline, expected to last <3 days Dressing (OT): Dependent Is this a change from baseline?: Change from baseline, expected to last <3days Grooming: Dependent Is this a change from baseline?: Change from baseline, expected to last <3 days Feeding: Needs assistance Is this a change from baseline?: Change from baseline, expected to last <3 days Bathing: Dependent Is this a change from baseline?: Pre-admission baseline Toileting: Independent In/Out Bed: Needs assistance Is this a change from baseline?: Pre-admission baseline Walks in Home: Dependent Is this a change from baseline?: Pre-admission baseline Does the patient have difficulty walking or climbing stairs?: Yes Weakness of Legs: Both Weakness of Arms/Hands: Both  Permission Sought/Granted Permission sought to share information with : Chartered certified accountant granted to share information with : Yes, Verbal Permission Granted     Permission granted to share info w AGENCY: SNFs        Emotional Assessment       Orientation: : Oriented to Self, Oriented to Place Alcohol / Substance Use: Not Applicable Psych Involvement: No (comment)  Admission diagnosis:  Delirium [R41.0] Urinary tract infection with hematuria, site unspecified [N39.0, R31.9] Acute hypoxemic respiratory failure due to COVID-19 ([place]) [U07.1, J96.01] COVID-19 virus  infection [U07.1] Pneumonia due to COVID-19 virus [U07.1, J12.82] Patient Active Problem List   Diagnosis Date Noted  . Hypernatremia 04/30/2019  . Aspiration pneumonia ([place]) 04/30/2019  . Acute urinary retention 04/28/2019  . Pneumonia due to  COVID-19 virus 04/27/2019  . Normocytic anemia 04/27/2019  . Hyperglycemia 04/27/2019  . Hyperlipidemia 04/27/2019  . Acute hypoxemic respiratory failure due to COVID-19 ([place]) 04/27/2019  . AKI (acute kidney injury) ([place]) 04/27/2019  . Essential hypertension 10/30/2018  . Hypertensive urgency 10/28/2018  . Cerebral palsy ([place])   . Diabetes mellitus without complication ([place])   . Seizures ([place])    PCP:  Sinda Du, MD Pharmacy:   [facility] [place] [address] [place] [address] Phone: [phone] Fax: [phone]     Social Determinants of Health ([place]) Interventions    Readmission Risk Interventions Readmission Risk Prevention Plan 05/09/2019 10/31/2018  Post Dischage Appt - Not Complete  Appt Comments - Dr Luan Pulling says he plans to arrange virtual visit with pt and will schedule it himself with [facility] staff  Medication Screening - Complete  Transportation Screening Complete Complete  Home Care Screening Not Complete -  Home Care Screening Not Completed Comments Pt to go to SNF rehab -  Medication Review (RN CM) Complete -  Some recent data might be hidden

## 2019-05-09 NOTE — TOC Transition Note (Addendum)
Transition of Care [facility]) - CM/SW Discharge Note   Patient Details  Name: Jeremy Parks MRN: [id number] Date of Birth: 01-May-1951  Transition of Care [facility]) CM/SW Contact:  Shade Flood, LCSW Phone Number: 05/09/2019, 12:54 PM   Clinical Narrative:     Pt is accepted at [facility] and will be able to admit there today. Updated guardian who is in agreement. Updated MD.   Will send dc clinical electronically. RN can call report to [phone] ask for [place] after 1400.  EMS forms will be printed to the unit. Will call PTAR when pt ready.    Barriers to Discharge: SNF Pending bed offer   Patient Goals and CMS Choice     Choice offered to / list presented to : [place] / [place]  Discharge Placement                       Discharge Plan and Services In-house Referral: Clinical Social Work   Post Acute Care Choice: [place]                               Social Determinants of Health (SDOH) Interventions     Readmission Risk Interventions Readmission Risk Prevention Plan 05/09/2019 10/31/2018  Post Dischage Appt - Not Complete  Appt Comments - Dr Luan Pulling says he plans to arrange virtual visit with pt and will schedule it himself with [facility] staff  Medication Screening - Complete  Transportation Screening Complete Complete  Home Care Screening Not Complete -  Home Care Screening Not Completed Comments Pt to go to SNF rehab -  Medication Review (RN CM) Complete -  Some recent data might be hidden

## 2019-05-09 NOTE — Care Management Important Message (Signed)
Important Message  Patient Details  Name: DEMETRIO GRESSMAN MRN: [id number] Date of Birth: 1951/01/29   Medicare Important Message Given:  Yes - Important Message mailed due to current National Emergency  Verbal consent obtained due to current National Emergency  Relationship to patient: Daniel Name: Ulice Bold Call Date: 05/09/19  Time: 1245 Phone: [phone] Outcome: No Answer/Busy Important Message mailed to: Patient address on file    Delorse Lek 05/09/2019, 12:45 PM

## 2019-05-09 NOTE — Progress Notes (Signed)
[facility] arrived and picked up pt at 2005. All documents and belongings sent with pt to [facility].

## 2019-05-09 NOTE — Progress Notes (Signed)
Palliative Medicine RN Note: Rec'd call from pt's [place] [phone], ext [phone]). PMT NP Mariana Kaufman has gone off-service, so our NP Ihor Dow will review Mr Jeremy Parks case and return her call.  Marjie Skiff Joy Haegele, RN, BSN, [facility] Palliative Medicine Team 05/09/2019 9:31 AM Office [phone]

## 2019-05-09 NOTE — Progress Notes (Signed)
Report called to Donette Larry at [facility]. All questions/concerns addressed. Unable to get in contact w/Tiara Harrington Challenger, pt guardian at this time. Voicemail with callback number left. Nothing further to note.

## 2019-05-09 NOTE — Progress Notes (Signed)
Ulice Bold returned phone call to this RN. Pt update given as well as discussion of discharge and transport to SNF today.All questions/concerns addressed.

## 2019-05-09 NOTE — NC FL2 (Signed)
[place] LEVEL OF CARE SCREENING TOOL     IDENTIFICATION  Patient Name: Jeremy Parks Birthdate: 1950-06-03 Sex: male Admission Date (Current Location): 04/26/2019  [facility] and [place] Number:  Herbalist and Address:  [facility], [place] [address], [place], [place] [address]      Provider Number: [id number]  Attending Physician Name and Address:  Cherene Altes, MD  Relative Name and Phone Number:       Current Level of Care: Hospital Recommended Level of Care: [place] Prior Approval Number:    Date Approved/Denied:   PASRR Number: [id number]  Discharge Plan: SNF    Current Diagnoses: Patient Active Problem List   Diagnosis Date Noted  . Hypernatremia 04/30/2019  . Aspiration pneumonia ([place]) 04/30/2019  . Acute urinary retention 04/28/2019  . Pneumonia due to COVID-19 virus 04/27/2019  . Normocytic anemia 04/27/2019  . Hyperglycemia 04/27/2019  . Hyperlipidemia 04/27/2019  . Acute hypoxemic respiratory failure due to COVID-19 ([place]) 04/27/2019  . AKI (acute kidney injury) ([place]) 04/27/2019  . Essential hypertension 10/30/2018  . Hypertensive urgency 10/28/2018  . Cerebral palsy ([place])   . Diabetes mellitus without complication ([place])   . Seizures ([place])     Orientation RESPIRATION BLADDER Height & Weight     Self, Place  Normal Incontinent Weight: 180 lb 12.4 oz (82 kg) Height:  5\' 8"  (172.7 cm)  BEHAVIORAL SYMPTOMS/MOOD NEUROLOGICAL BOWEL NUTRITION STATUS    Convulsions/Seizures Incontinent Diet(Dysphagia 1 (Puree), thin liquids, medications crushed with puree, staff to assist with feeding, small bites/sips, sit upright 30 minutes after eating)  AMBULATORY STATUS COMMUNICATION OF NEEDS Skin   Total Care Verbally Normal                       Personal Care Assistance Level of Assistance  Total care       Total Care Assistance: Maximum assistance   Functional Limitations Info   Sight, Hearing, Speech Sight Info: Impaired Hearing Info: Adequate Speech Info: Adequate    SPECIAL CARE FACTORS FREQUENCY  PT (By licensed PT), OT (By licensed OT)     PT Frequency: 3 times week OT Frequency: 2 times week            Contractures Contractures Info: Not present    Additional Factors Info  Code Status, Allergies Code Status Info: Full Allergies Info: NKA           Current Medications (05/09/2019):  This is the current hospital active medication list Current Facility-Administered Medications  Medication Dose Route Frequency Provider Last Rate Last Admin  . acetaminophen (TYLENOL) suppository 650 mg  650 mg Rectal Q4H PRN Cherene Altes, MD   650 mg at 05/03/19 0932  . acetaminophen (TYLENOL) tablet 650 mg  650 mg Oral Q6H PRN Cherene Altes, MD   650 mg at 05/02/19 1346  . amLODipine (NORVASC) tablet 10 mg  10 mg Oral Daily Cherene Altes, MD   10 mg at 05/08/19 1030  . carbamazepine (TEGRETOL) tablet 300 mg  300 mg Oral BID Cherene Altes, MD   300 mg at 05/08/19 2202  . Chlorhexidine Gluconate Cloth 2 % PADS 6 each  6 each Topical Daily Annita Brod, MD   6 each at 05/08/19 1033  . docusate sodium (COLACE) capsule 100 mg  100 mg Oral BID Adefeso, Oladapo, DO   100 mg at 05/08/19 2204  . enoxaparin (LOVENOX) injection 40  mg  40 mg Subcutaneous Q24H Cherene Altes, MD   40 mg at 05/08/19 1030  . feeding supplement (ENSURE ENLIVE) (ENSURE ENLIVE) liquid 237 mL  237 mL Oral BID BM Cherene Altes, MD   237 mL at 05/08/19 1050  . guaiFENesin-dextromethorphan (ROBITUSSIN DM) 100-10 MG/5ML syrup 10 mL  10 mL Oral Q4H PRN Adefeso, Oladapo, DO      . insulin aspart (novoLOG) injection 0-5 Units  0-5 Units Subcutaneous QHS Manuella Ghazi, Pratik D, DO   2 Units at 05/05/19 2040  . insulin aspart (novoLOG) injection 0-9 Units  0-9 Units Subcutaneous TID WC Shah, Pratik D, DO   2 Units at 05/08/19 1322  . insulin detemir (LEVEMIR) injection 10 Units  10  Units Subcutaneous Daily Cherene Altes, MD   10 Units at 05/08/19 1030  . metoprolol tartrate (LOPRESSOR) injection 5 mg  5 mg Intravenous Q6H PRN Cherene Altes, MD   5 mg at 05/06/19 1237  . multivitamin with minerals tablet 1 tablet  1 tablet Oral Daily Cherene Altes, MD   1 tablet at 05/08/19 1031  . mupirocin ointment (BACTROBAN) 2 %   Nasal BID Cherene Altes, MD   Given at 05/08/19 2203  . ondansetron (ZOFRAN) injection 4 mg  4 mg Intravenous Q6H PRN Elodia Florence., MD   4 mg at 05/03/19 0900  . zolpidem (AMBIEN) tablet 5 mg  5 mg Oral QHS PRN Cherene Altes, MD         Discharge Medications: Please see discharge summary for a list of discharge medications.  Relevant Imaging Results:  Relevant Lab Results:   Additional Information SSN: [place], LCSW

## 2019-05-09 NOTE — Consult Note (Signed)
Consultation Note Date: 05/09/19  Patient Name: Jeremy Parks  DOB: [birth year]  MRN: [id number]  Age / Sex: 69 y.o., male  PCP: Jeremy Du, MD Referring Physician: Cherene Altes, MD  Reason for Consultation: Establishing goals of care  HPI/Patient Profile: 69 y.o. male  with past medical history of cerebral palsy, seizures, DM admitted on 04/26/2019 with altered mental status. Originally seen in ED on 12/18 with slurred speech which resolved. Following that visit, patient had multiple repeat ED visits for waxing and waning confusion/lethargy. On 12/22, Covid test positive. On 12/23, patient with worsening somnolence and fever. CXR noted patchy opacity at left lung base. Hospital admission for sepsis, Covid pneumonia, possible aspiration pneumonia, AKI, acute delirium, and electrolyte imbalances. Palliative medicine consultation for goals of care.   Clinical Assessment and Goals of Care:  I have reviewed medical records, discussed with care team, and spoke with patient's DSS legal guardian Jeremy Parks) via telephone.    I introduced Palliative Medicine as specialized medical care for people living with serious illness. It focuses on providing relief from the symptoms and stress of a serious illness. The goal is to improve quality of life for both the patient and the family.   We discussed a brief life review of the patient. Prior to admission, living at [facility]. Baseline, he requires total assist with ambulation but requires minimal assist with grooming and feeding. He has been ward of the state since 2011. Jeremy Parks's brother lives at the facility but they unfortunately do not get along.   Discussed course of hospitalization including diagnoses, interventions, plan of care. Patient is stable and will be discharging soon to SNF.   I attempted to elicit values and goals of care. Advanced directives,  concepts specific to code status, artifical feeding and hydration were discussed. Educated on medical recommendation for consideration of DNR/limitations to care with underlying chronic conditions, acute hospitalization, and declining health. Explained Dr. Garen Parks recommendation for DNR as well and reviewed his notes with guardian. Jeremy Parks shares that this will be considered but would have to be reviewed and approved by her director. Explained that this can be completed with outpatient palliative provider too, since he likely will discharge soon.   Requested legal guardianship documentation for Jeremy Parks's chart. Jeremy Parks plans to fax documentation.   Questions and concerns were addressed. PMT contact information given.    SUMMARY OF RECOMMENDATIONS    Full code/Full scope  Patient stable and may discharge to SNF today.  Legal guardianship documentation requested for EMR. Jeremy Parks (DSS legal guardian) will fax PMT.  May benefit from outpatient palliative referral at SNF.  Code Status/Advance Care Planning:  Full code--educated on consideration of DNR code status and limitations to care. This will have to be discussed and approved by DSS director.   Symptom Management:   Per attending  Palliative Prophylaxis:   Aspiration, Delirium Protocol, Oral Care and Turn Reposition  Psycho-social/Spiritual:   Desire for further Chaplaincy support: yes  Additional Recommendations: Caregiving  Support/Resources  Prognosis:  Unable to determine  Discharge Planning: [place] for rehab with Palliative care service follow-up      Primary Diagnoses: Present on Admission: . Pneumonia due to COVID-19 virus . Normocytic anemia . Hyperglycemia . Cerebral palsy ([place]) . Essential hypertension . Hyperlipidemia . Acute hypoxemic respiratory failure due to COVID-19 ([place]) . AKI (acute kidney injury) ([place]) . Aspiration pneumonia ([place])   I have reviewed the medical record,  interviewed the patient and family, and examined the patient. The following aspects are pertinent.  Past Medical History:  Diagnosis Date  . Cerebral palsy ([place])   . Diabetes mellitus without complication ([place])   . Gingivitis   . Lipoma of abdominal wall   . Seizures ([place])    Social History   Socioeconomic History  . Marital status: Single    Spouse name: Not on file  . Number of children: Not on file  . Years of education: Not on file  . Highest education level: Not on file  Occupational History  . Not on file  Tobacco Use  . Smoking status: Never Smoker  . Smokeless tobacco: Never Used  Substance and Sexual Activity  . Alcohol use: No  . Drug use: No  . Sexual activity: Never  Other Topics Concern  . Not on file  Social History Narrative  . Not on file   Social Determinants of Health   Financial Resource Strain:   . Difficulty of Paying Living Expenses: Not on file  Food Insecurity:   . Worried About Charity fundraiser in the Last Year: Not on file  . Ran Out of Food in the Last Year: Not on file  Transportation Needs:   . Lack of Transportation (Medical): Not on file  . Lack of Transportation (Non-Medical): Not on file  Physical Activity:   . Days of Exercise per Week: Not on file  . Minutes of Exercise per Session: Not on file  Stress:   . Feeling of Stress : Not on file  Social Connections:   . Frequency of Communication with Friends and Family: Not on file  . Frequency of Social Gatherings with Friends and Family: Not on file  . Attends Religious Services: Not on file  . Active Member of Clubs or Organizations: Not on file  . Attends Archivist Meetings: Not on file  . Marital Status: Not on file   Family History  Family history unknown: Yes   Scheduled Meds: . amLODipine  10 mg Oral Daily  . carbamazepine  300 mg Oral BID  . Chlorhexidine Gluconate Cloth  6 each Topical Daily  . docusate sodium  100 mg Oral BID  . enoxaparin (LOVENOX)  injection  40 mg Subcutaneous Q24H  . feeding supplement (ENSURE ENLIVE)  237 mL Oral BID BM  . insulin aspart  0-5 Units Subcutaneous QHS  . insulin aspart  0-9 Units Subcutaneous TID WC  . insulin detemir  10 Units Subcutaneous Daily  . multivitamin with minerals  1 tablet Oral Daily  . mupirocin ointment   Nasal BID   Continuous Infusions: PRN Meds:.acetaminophen, acetaminophen, guaiFENesin-dextromethorphan, metoprolol tartrate, ondansetron (ZOFRAN) IV, zolpidem Medications Prior to Admission:  Prior to Admission medications   Medication Sig Start Date End Date Taking? Authorizing Provider  acetaminophen (TYLENOL) 500 MG tablet Take 500 mg by mouth every 8 (eight) hours.   Yes [provider]  amLODipine (NORVASC) 10 MG tablet Take 1 tablet (10 mg total) by mouth daily. 10/31/18  Yes Jeremy Du, MD  atorvastatin (LIPITOR) 40 MG tablet Take 40 mg by mouth at bedtime.    Yes [provider]  carbamazepine (TEGRETOL) 200 MG tablet Take 1.5 tablets (300 mg total) by mouth 2 (two) times daily. 10/31/16  Yes Forde Dandy, MD  chlorhexidine (PERIDEX) 0.12 % solution Use as directed 15 mLs in the mouth or throat daily. **Swish 15 mls for 30 seconds then spit out. USE DAILY**   Yes [provider]  docusate sodium (COLACE) 100 MG capsule Take 100 mg by mouth 2 (two) times daily.   Yes [provider]  fluticasone (FLONASE) 50 MCG/ACT nasal spray Place 2 sprays into both nostrils daily.   Yes [provider]  Insulin Glargine (BASAGLAR KWIKPEN [place]) Inject 20 Units into the skin at bedtime.   Yes [provider]  nystatin (MYCOSTATIN/NYSTOP) powder Apply topically 4 (four) times daily. Apply topically to affected area of groin twice daily as needed for rash.   Yes [provider]   No Known Allergies Review of Systems  Physical Exam  Vital Signs: BP (!) 156/84   Pulse 93   Temp 99.7 F (37.6 C) (Axillary)   Resp 18   Ht 5\' 8"   (1.727 m)   Wt 82 kg   SpO2 100%   BMI 27.49 kg/m  Pain Scale: 0-10 POSS *See Group Information*: 1-Acceptable,Awake and alert Pain Score: 0-No pain   SpO2: SpO2: 100 % O2 Device:SpO2: 100 % O2 Flow Rate: .O2 Flow Rate (L/min): 2 L/min  IO: Intake/output summary:   Intake/Output Summary (Last 24 hours) at 05/09/2019 1055 Last data filed at 05/09/2019 0600 Gross per 24 hour  Intake 240 ml  Output 1025 ml  Net -785 ml    LBM: Last BM Date: 05/07/19 Baseline Weight: Weight: 82 kg Most recent weight: Weight: 82 kg     Palliative Assessment/Data: PPS 50%     Time In: 1120 Time Out:1200 Time Total: 40 Greater than 50%  of this time was spent counseling and coordinating care related to the above assessment and plan.  The above conversation was completed via telephone due to visitor restrictions during COVID-19 pandemic. Thorough chart review and discussion with multidisciplinary team was completed as part of assessment. No physical examination was performed.   Signed by:  Ihor Dow, DNP, FNP-C Palliative Medicine Team  Phone: [phone] Fax: [phone]   Please contact Palliative Medicine Team phone at [phone] for questions and concerns.  For individual provider: See Shea Evans

## 2019-05-10 DIAGNOSIS — G809 Cerebral palsy, unspecified: Secondary | ICD-10-CM | POA: Diagnosis not present

## 2019-05-10 DIAGNOSIS — U071 COVID-19: Secondary | ICD-10-CM | POA: Diagnosis not present

## 2019-05-10 DIAGNOSIS — E119 Type 2 diabetes mellitus without complications: Secondary | ICD-10-CM | POA: Diagnosis not present

## 2019-05-10 DIAGNOSIS — J1281 Pneumonia due to SARS-associated coronavirus: Secondary | ICD-10-CM | POA: Diagnosis not present

## 2019-05-11 ENCOUNTER — Other Ambulatory Visit: Payer: Self-pay | Admitting: *Deleted

## 2019-05-11 DIAGNOSIS — R319 Hematuria, unspecified: Secondary | ICD-10-CM

## 2019-05-11 DIAGNOSIS — N390 Urinary tract infection, site not specified: Secondary | ICD-10-CM

## 2019-05-11 DIAGNOSIS — Z515 Encounter for palliative care: Secondary | ICD-10-CM

## 2019-05-11 DIAGNOSIS — Z7189 Other specified counseling: Secondary | ICD-10-CM

## 2019-05-11 DIAGNOSIS — R410 Disorientation, unspecified: Secondary | ICD-10-CM

## 2019-05-11 NOTE — Patient Outreach (Signed)
Screened for potential [facility] Care Management needs as a benefit of  [employer] Medicare.  [name] is currently receiving skilled therapy at [facility].  91 am- Writer attended telephonic interdisciplinary team meeting to assess for disposition needs and transition plan for resident.   Member is from [facility]. He has a guardian with [facility].   Writer will continue to follow while member resides at [facility].  Jeremy Rolling, MSN-Ed, Jeremy Parks,Jeremy Parks [place] Acute Care Coordinator [phone] [facility]) [phone]  (Toll free office)

## 2019-05-25 ENCOUNTER — Other Ambulatory Visit: Payer: Self-pay | Admitting: *Deleted

## 2019-05-25 NOTE — Patient Outreach (Signed)
Screened for potential [facility] Care Management needs as a benefit of  [employer] Medicare.  Member is currently receiving skilled therapy at [facility] SNF.   Writer attended telephonic interdisciplinary team meeting to assess for disposition needs and transition plan for resident.   Facility dc planner reports transition plan is for long term care. Medicaid is pending.   Will continue to follow while receiving skilled care at SNF.   Marthenia Rolling, MSN-Ed, RN,BSN [place] Acute Care Coordinator [phone] [facility]) [phone]  (Toll free office)

## 2019-06-02 ENCOUNTER — Other Ambulatory Visit: Payer: Self-pay | Admitting: *Deleted

## 2019-06-02 NOTE — Patient Outreach (Signed)
Member screened for potential [facility] Care Management needs as a benefit of [place] [employer].  Verified with [facility] RN that Mr. Erhard transitioned to long term care at [facility].  No identifiable [facility] Care Management needs at this time.    Marthenia Rolling, MSN-Ed, RN,BSN [place] Acute Care Coordinator [phone] [facility]) [phone]  (Toll free office)

## 2019-06-06 DIAGNOSIS — F79 Unspecified intellectual disabilities: Secondary | ICD-10-CM | POA: Diagnosis not present

## 2019-06-08 DIAGNOSIS — E876 Hypokalemia: Secondary | ICD-10-CM | POA: Diagnosis not present

## 2019-06-08 DIAGNOSIS — N390 Urinary tract infection, site not specified: Secondary | ICD-10-CM | POA: Diagnosis not present

## 2019-06-08 DIAGNOSIS — J690 Pneumonitis due to inhalation of food and vomit: Secondary | ICD-10-CM | POA: Diagnosis not present

## 2019-06-08 DIAGNOSIS — G809 Cerebral palsy, unspecified: Secondary | ICD-10-CM | POA: Diagnosis not present

## 2019-06-29 DIAGNOSIS — A419 Sepsis, unspecified organism: Secondary | ICD-10-CM | POA: Diagnosis not present

## 2019-06-29 DIAGNOSIS — U071 COVID-19: Secondary | ICD-10-CM | POA: Diagnosis not present

## 2019-06-29 DIAGNOSIS — G809 Cerebral palsy, unspecified: Secondary | ICD-10-CM | POA: Diagnosis not present

## 2019-06-29 DIAGNOSIS — N390 Urinary tract infection, site not specified: Secondary | ICD-10-CM | POA: Diagnosis not present

## 2019-07-04 DIAGNOSIS — F79 Unspecified intellectual disabilities: Secondary | ICD-10-CM | POA: Diagnosis not present

## 2019-07-10 DIAGNOSIS — J1281 Pneumonia due to SARS-associated coronavirus: Secondary | ICD-10-CM | POA: Diagnosis not present

## 2019-07-10 DIAGNOSIS — N390 Urinary tract infection, site not specified: Secondary | ICD-10-CM | POA: Diagnosis not present

## 2019-07-10 DIAGNOSIS — E119 Type 2 diabetes mellitus without complications: Secondary | ICD-10-CM | POA: Diagnosis not present

## 2019-07-10 DIAGNOSIS — G809 Cerebral palsy, unspecified: Secondary | ICD-10-CM | POA: Diagnosis not present

## 2019-07-10 DIAGNOSIS — U071 COVID-19: Secondary | ICD-10-CM | POA: Diagnosis not present

## 2019-08-10 DIAGNOSIS — N390 Urinary tract infection, site not specified: Secondary | ICD-10-CM | POA: Diagnosis not present

## 2019-08-10 DIAGNOSIS — E119 Type 2 diabetes mellitus without complications: Secondary | ICD-10-CM | POA: Diagnosis not present

## 2019-08-10 DIAGNOSIS — U071 COVID-19: Secondary | ICD-10-CM | POA: Diagnosis not present

## 2019-08-10 DIAGNOSIS — G809 Cerebral palsy, unspecified: Secondary | ICD-10-CM | POA: Diagnosis not present

## 2020-03-21 ENCOUNTER — Other Ambulatory Visit (HOSPITAL_COMMUNITY): Payer: Medicare Other

## 2020-03-26 NOTE — H&P (Signed)
Surgical History & Physical  Patient Name: Jeremy Parks DOB: April 19, 1951  Surgery: Cataract extraction with intraocular lens implant phacoemulsification; Both Eyes  Surgeon: Baruch Goldmann MD Surgery Date:  04/15/2020 Pre-Op Date:  03/26/2020  HPI: A 35 Yr. old male patient is referred by Dr Barbie Banner for cataract eval pt lives in a Health care facility, with dementia, muscle weakness. Pt cooperation is not good, can hardly speak, Hx taken mainly from records. This is negatively affecting the patient's quality of life.  Medical History: Cataracts Diabetes Generalized muscle weakness, lack of coordination,... High Blood Pressure LDL  Review of Systems Negative Allergic/Immunologic Negative Cardiovascular Negative Constitutional Negative Ear, Nose, Mouth & Throat Negative Endocrine Negative Eyes Negative Gastrointestinal Negative Genitourinary Negative Hemotologic/Lymphatic Negative Integumentary Negative Musculoskeletal Negative Neurological Negative Psychiatry Negative Respiratory  Social   Never smoked   Medication Aspirin, Atorvastatin, Lantus, Paridex solution, Carbamazepine, Docusate, Docu,   Sx/Procedures  None  Drug Allergies   NKDA  History & Physical: Heent:  Cataracts, Both eyes NECK: supple without bruits LUNGS: lungs clear to auscultation CV: regular rate and rhythm Abdomen: soft and non-tender   Impression & Plan: Assessment: 1.  COMBINED FORMS AGE RELATED CATARACT; Right Eye (H25.811) 2.  CATARACT HYPERMATURE (MORGAGNIAN) AGE RELATED; Left Eye (H25.22)  Plan: 1.  Cataract accounts for the patient's decreased vision. This visual impairment is not correctable with a tolerable change in glasses or contact lenses. Cataract surgery with an implantation of a new lens should significantly improve the visual and functional status of the patient. Discussed all risks, benefits, alternatives, and potential complications. Discussed the procedures and recovery.  Patient desires to have surgery. A-scan ordered and performed today for intra-ocular lens calculations. The surgery will be performed in order to improve vision for driving, reading, and for eye examinations. Recommend phacoemulsification with intra-ocular lens. Recommend Dextenza for post-operative pain and inflammation. Right Eye. first. Dilates well - shugarcaine by protocol. [facility]. Poor cooperation, general anesthesia. 2.  Cataract accounts for the patient's decreased vision. This visual impairment is not correctable with a tolerable change in glasses or contact lenses. Cataract surgery with an implantation of a new lens should significantly improve the visual and functional status of the patient. Discussed all risks, benefits, alternatives, and potential complications. Discussed the procedures and recovery. Patient desires to have surgery. A-scan ordered and performed today for intra-ocular lens calculations. The surgery will be performed in order to improve vision for driving, reading, and for eye examinations. Recommend phacoemulsification with intra-ocular lens. Recommend Dextenza for post-operative pain and inflammation. Left Eye. Dilates well - shugarcaine by protocol. [facility].

## 2020-04-05 NOTE — Patient Instructions (Signed)
Jeremy Parks  04/05/2020     @PREFPERIOPPHARMACY @   Your procedure is scheduled on  04/15/2020.  Report to [facility] at  1000  A.M.  Call this number if you have problems the morning of surgery:  [phone]   Remember:  Do not eat or drink after midnight.                        Take these medicines the morning of surgery with A SIP OF WATER amlodipine, tegretol. Take 5 units of your glargine the night before your surgery. DO NOT take any medications for diabetes the morning of your procedure.    Do not wear jewelry, make-up or nail polish.  Do not wear lotions, powders, or perfumes. Please wear deodorant and brush your teeth.  Do not shave 48 hours prior to surgery.  Men may shave face and neck.  Do not bring valuables to the hospital.  [facility] is not responsible for any belongings or valuables.  Contacts, dentures or bridgework may not be worn into surgery.  Leave your suitcase in the car.  After surgery it may be brought to your room.  For patients admitted to the hospital, discharge time will be determined by your treatment team.  Patients discharged the day of surgery will not be allowed to drive home.   Name and phone number of your driver:   [facility] Staff Special instructions:  DO NOT smoke the morning of your procedure.  Please read over the following fact sheets that you were given. Anesthesia Post-op Instructions and Care and Recovery After Surgery       Cataract Surgery, Care After This sheet gives you information about how to care for yourself after your procedure. Your health care provider may also give you more specific instructions. If you have problems or questions, contact your health care provider. What can I expect after the procedure? After the procedure, it is common to have:  Itching.  Discomfort.  Fluid discharge.  Sensitivity to light and to touch.  Bruising in or around the eye.  Mild blurred vision. Follow these  instructions at home: Eye care   Do not touch or rub your eyes.  Protect your eyes as told by your health care provider. You may be told to wear a protective eye shield or sunglasses.  Do not put a contact lens into the affected eye or eyes until your health care provider approves.  Keep the area around your eye clean and dry: ? Avoid swimming. ? Do not allow water to hit you directly in the face while showering. ? Keep soap and shampoo out of your eyes.  Check your eye every day for signs of infection. Watch for: ? Redness, swelling, or pain. ? Fluid, blood, or pus. ? Warmth. ? A bad smell. ? Vision that is getting worse. ? Sensitivity that is getting worse. Activity  Do not drive for 24 hours if you were given a sedative during your procedure.  Avoid strenuous activities, such as playing contact sports, for as long as told by your health care provider.  Do not drive or use heavy machinery until your health care provider approves.  Do not bend or lift heavy objects. Bending increases pressure in the eye. You can walk, climb stairs, and do light household chores.  Ask your health care provider when you can return to work. If you work in a dusty environment, you  may be advised to wear protective eyewear for a period of time. General instructions  Take or apply over-the-counter and prescription medicines only as told by your health care provider. This includes eye drops.  Keep all follow-up visits as told by your health care provider. This is important. Contact a health care provider if:  You have increased bruising around your eye.  You have pain that is not helped with medicine.  You have a fever.  You have redness, swelling, or pain in your eye.  You have fluid, blood, or pus coming from your incision.  Your vision gets worse.  Your sensitivity to light gets worse. Get help right away if:  You have sudden loss of vision.  You see flashes of light or spots  (floaters).  You have severe eye pain.  You develop nausea or vomiting. Summary  After your procedure, it is common to have itching, discomfort, bruising, fluid discharge, or sensitivity to light.  Follow instructions from your health care provider about caring for your eye after the procedure.  Do not rub your eye after the procedure. You may need to wear eye protection or sunglasses. Do not wear contact lenses. Keep the area around your eye clean and dry.  Avoid activities that require a lot of effort. These include playing sports and lifting heavy objects.  Contact a health care provider if you have increased bruising, pain that does not go away, or a fever. Get help right away if you suddenly lose your vision, see flashes of light or spots, or have severe pain in the eye. This information is not intended to replace advice given to you by your health care provider. Make sure you discuss any questions you have with your health care provider. Document Revised: 02/14/2019 Document Reviewed: 10/18/2017 Elsevier Patient Education  2020 [place] Anesthesia, Adult, Care After This sheet gives you information about how to care for yourself after your procedure. Your health care provider may also give you more specific instructions. If you have problems or questions, contact your health care provider. What can I expect after the procedure? After the procedure, the following side effects are common:  Pain or discomfort at the IV site.  Nausea.  Vomiting.  Sore throat.  Trouble concentrating.  Feeling cold or chills.  Weak or tired.  Sleepiness and fatigue.  Soreness and body aches. These side effects can affect parts of the body that were not involved in surgery. Follow these instructions at home:  For at least 24 hours after the procedure:  Have a responsible adult stay with you. It is important to have someone help care for you until you are awake and  alert.  Rest as needed.  Do not: ? Participate in activities in which you could fall or become injured. ? Drive. ? Use heavy machinery. ? Drink alcohol. ? Take sleeping pills or medicines that cause drowsiness. ? Make important decisions or sign legal documents. ? Take care of children on your own. Eating and drinking  Follow any instructions from your health care provider about eating or drinking restrictions.  When you feel hungry, start by eating small amounts of foods that are soft and easy to digest (bland), such as toast. Gradually return to your regular diet.  Drink enough fluid to keep your urine pale yellow.  If you vomit, rehydrate by drinking water, juice, or clear broth. General instructions  If you have sleep apnea, surgery and certain medicines can increase your risk for breathing  problems. Follow instructions from your health care provider about wearing your sleep device: ? Anytime you are sleeping, including during daytime naps. ? While taking prescription pain medicines, sleeping medicines, or medicines that make you drowsy.  Return to your normal activities as told by your health care provider. Ask your health care provider what activities are safe for you.  Take over-the-counter and prescription medicines only as told by your health care provider.  If you smoke, do not smoke without supervision.  Keep all follow-up visits as told by your health care provider. This is important. Contact a health care provider if:  You have nausea or vomiting that does not get better with medicine.  You cannot eat or drink without vomiting.  You have pain that does not get better with medicine.  You are unable to pass urine.  You develop a skin rash.  You have a fever.  You have redness around your IV site that gets worse. Get help right away if:  You have difficulty breathing.  You have chest pain.  You have blood in your urine or stool, or you vomit  blood. Summary  After the procedure, it is common to have a sore throat or nausea. It is also common to feel tired.  Have a responsible adult stay with you for the first 24 hours after general anesthesia. It is important to have someone help care for you until you are awake and alert.  When you feel hungry, start by eating small amounts of foods that are soft and easy to digest (bland), such as toast. Gradually return to your regular diet.  Drink enough fluid to keep your urine pale yellow.  Return to your normal activities as told by your health care provider. Ask your health care provider what activities are safe for you. This information is not intended to replace advice given to you by your health care provider. Make sure you discuss any questions you have with your health care provider. Document Revised: 04/23/2017 Document Reviewed: 12/04/2016 Elsevier Patient Education  [place].

## 2020-04-11 ENCOUNTER — Encounter (HOSPITAL_COMMUNITY): Payer: Self-pay

## 2020-04-11 ENCOUNTER — Other Ambulatory Visit: Payer: Self-pay

## 2020-04-11 ENCOUNTER — Encounter (HOSPITAL_COMMUNITY)
Admission: RE | Admit: 2020-04-11 | Discharge: 2020-04-11 | Disposition: A | Discharge status: Home / Self Care | Payer: Medicare Other | Source: Ambulatory Visit | Attending: Ophthalmology | Admitting: Ophthalmology

## 2020-04-11 DIAGNOSIS — Z01812 Encounter for preprocedural laboratory examination: Secondary | ICD-10-CM | POA: Insufficient documentation

## 2020-04-11 LAB — BASIC METABOLIC PANEL
Anion gap: 8 (ref 5–15)
BUN: 8 mg/dL (ref 8–23)
CO2: 25 mmol/L (ref 22–32)
Calcium: 9.1 mg/dL (ref 8.9–10.3)
Chloride: 95 mmol/L — ABNORMAL LOW (ref 98–111)
Creatinine, Ser: 0.77 mg/dL (ref 0.61–1.24)
GFR, Estimated: >60 mL/min (ref >60)
Glucose, Bld: 115 mg/dL — ABNORMAL HIGH (ref 70–99)
Potassium: 4.2 mmol/L (ref 3.5–5.1)
Sodium: 128 mmol/L — ABNORMAL LOW (ref 135–145)

## 2020-04-11 LAB — HEMOGLOBIN A1C
Hgb A1c MFr Bld: 5.4 % (ref 4.8–5.6)
Mean Plasma Glucose: 108.28 mg/dL

## 2020-04-11 NOTE — Progress Notes (Signed)
Copy of B met with low sodium 128 faxed to Dr Sherrie Sport.

## 2020-04-15 ENCOUNTER — Encounter (HOSPITAL_COMMUNITY)
Admission: RE | Disposition: A | Discharge status: Home / Self Care | Payer: Self-pay | Source: Home / Self Care | Attending: Ophthalmology

## 2020-04-15 ENCOUNTER — Ambulatory Visit (HOSPITAL_COMMUNITY): Payer: Medicare Other | Admitting: Anesthesiology

## 2020-04-15 ENCOUNTER — Encounter (HOSPITAL_COMMUNITY): Payer: Self-pay | Admitting: Ophthalmology

## 2020-04-15 ENCOUNTER — Ambulatory Visit (HOSPITAL_COMMUNITY)
Admission: RE | Admit: 2020-04-15 | Discharge: 2020-04-15 | Disposition: A | Discharge status: Home / Self Care | Payer: Medicare Other | Attending: Ophthalmology | Admitting: Ophthalmology

## 2020-04-15 DIAGNOSIS — H25811 Combined forms of age-related cataract, right eye: Secondary | ICD-10-CM | POA: Diagnosis present

## 2020-04-15 DIAGNOSIS — Z20822 Contact with and (suspected) exposure to covid-19: Secondary | ICD-10-CM | POA: Insufficient documentation

## 2020-04-15 DIAGNOSIS — H2522 Age-related cataract, morgagnian type, left eye: Secondary | ICD-10-CM | POA: Diagnosis not present

## 2020-04-15 HISTORY — PX: CATARACT EXTRACTION W/PHACO: SHX586

## 2020-04-15 LAB — SARS CORONAVIRUS 2 BY RT PCR (HOSPITAL ORDER, PERFORMED IN ~~LOC~~ HOSPITAL LAB): SARS Coronavirus 2: NEGATIVE

## 2020-04-15 SURGERY — PHACOEMULSIFICATION, CATARACT, WITH IOL INSERTION
Anesthesia: General | Site: Eye | Laterality: Bilateral

## 2020-04-15 MED ORDER — TRYPAN BLUE 0.06 % OP SOLN
OPHTHALMIC | Status: DC | PRN
  Administered 2020-04-15 (×2): 0.5 mL via INTRAOCULAR

## 2020-04-15 MED ORDER — PROPOFOL 10 MG/ML IV BOLUS
INTRAVENOUS | Status: DC | PRN
  Administered 2020-04-15: 80 mg via INTRAVENOUS

## 2020-04-15 MED ORDER — EPINEPHRINE PF 1 MG/ML IJ SOLN
INTRAOCULAR | Status: DC | PRN
  Administered 2020-04-15 (×2): 500 mL

## 2020-04-15 MED ORDER — TETRACAINE HCL 0.5 % OP SOLN
1.0000 [drp] | OPHTHALMIC | Status: AC | PRN
  Administered 2020-04-15 (×3): 1 [drp] via OPHTHALMIC

## 2020-04-15 MED ORDER — TRYPAN BLUE 0.06 % OP SOLN
OPHTHALMIC | Status: AC
  Filled 2020-04-15: qty 0.5

## 2020-04-15 MED ORDER — PHENYLEPHRINE HCL 2.5 % OP SOLN
1.0000 [drp] | OPHTHALMIC | Status: AC | PRN
  Administered 2020-04-15 (×3): 1 [drp] via OPHTHALMIC

## 2020-04-15 MED ORDER — BSS IO SOLN
INTRAOCULAR | Status: DC | PRN
  Administered 2020-04-15 (×2): 15 mL via INTRAOCULAR

## 2020-04-15 MED ORDER — LACTATED RINGERS IV SOLN: INTRAVENOUS | Status: DC

## 2020-04-15 MED ORDER — PHENYLEPHRINE-KETOROLAC 1-0.3 % IO SOLN
INTRAOCULAR | Status: AC
  Filled 2020-04-15: qty 4

## 2020-04-15 MED ORDER — EPHEDRINE SULFATE 50 MG/ML IJ SOLN
INTRAMUSCULAR | Status: DC | PRN
  Administered 2020-04-15: 10 mg via INTRAVENOUS

## 2020-04-15 MED ORDER — PHENYLEPHRINE HCL 2.5 % OP SOLN: 1.0000 [drp] | OPHTHALMIC | Status: DC | PRN

## 2020-04-15 MED ORDER — ONDANSETRON HCL 4 MG/2ML IJ SOLN
INTRAMUSCULAR | Status: AC
  Filled 2020-04-15: qty 2

## 2020-04-15 MED ORDER — PROVISC 10 MG/ML IO SOLN
INTRAOCULAR | Status: DC | PRN
  Administered 2020-04-15 (×2): 0.85 mL via INTRAOCULAR

## 2020-04-15 MED ORDER — CYCLOPENTOLATE-PHENYLEPHRINE 0.2-1 % OP SOLN
1.0000 [drp] | OPHTHALMIC | Status: AC | PRN
  Administered 2020-04-15 (×3): 1 [drp] via OPHTHALMIC

## 2020-04-15 MED ORDER — STERILE WATER FOR IRRIGATION IR SOLN
Status: DC | PRN
  Administered 2020-04-15 (×2): 250 mL

## 2020-04-15 MED ORDER — SODIUM HYALURONATE 23 MG/ML IO SOLN
INTRAOCULAR | Status: DC | PRN
  Administered 2020-04-15 (×2): 0.6 mL via INTRAOCULAR

## 2020-04-15 MED ORDER — LIDOCAINE HCL 3.5 % OP GEL
1.0000 "application " | Freq: Once | OPHTHALMIC | Status: AC
  Administered 2020-04-15: 1 via OPHTHALMIC

## 2020-04-15 MED ORDER — POVIDONE-IODINE 5 % OP SOLN
OPHTHALMIC | Status: DC | PRN
  Administered 2020-04-15 (×2): 1 via OPHTHALMIC

## 2020-04-15 MED ORDER — CYCLOPENTOLATE-PHENYLEPHRINE 0.2-1 % OP SOLN: 1.0000 [drp] | OPHTHALMIC | Status: DC | PRN

## 2020-04-15 MED ORDER — PROPOFOL 10 MG/ML IV BOLUS
INTRAVENOUS | Status: AC
  Filled 2020-04-15: qty 20

## 2020-04-15 MED ORDER — EPHEDRINE 5 MG/ML INJ
INTRAVENOUS | Status: AC
  Filled 2020-04-15: qty 10

## 2020-04-15 MED ORDER — MIDAZOLAM HCL 2 MG/2ML IJ SOLN
INTRAMUSCULAR | Status: AC
  Filled 2020-04-15: qty 2

## 2020-04-15 MED ORDER — NEOMYCIN-POLYMYXIN-DEXAMETH 3.5-10000-0.1 OP SUSP
OPHTHALMIC | Status: DC | PRN
  Administered 2020-04-15 (×2): 1 [drp] via OPHTHALMIC

## 2020-04-15 MED ORDER — LIDOCAINE HCL (PF) 1 % IJ SOLN
INTRAOCULAR | Status: DC | PRN
  Administered 2020-04-15 (×2): 1 mL via OPHTHALMIC

## 2020-04-15 MED ORDER — TETRACAINE HCL 0.5 % OP SOLN: 1.0000 [drp] | OPHTHALMIC | Status: DC | PRN

## 2020-04-15 MED ORDER — LACTATED RINGERS IV SOLN: INTRAVENOUS | Status: DC | PRN

## 2020-04-15 MED ORDER — ONDANSETRON HCL 4 MG/2ML IJ SOLN
INTRAMUSCULAR | Status: DC | PRN
  Administered 2020-04-15: 4 mg via INTRAVENOUS

## 2020-04-15 SURGICAL SUPPLY — 18 items
CLOTH BEACON ORANGE TIMEOUT ST (SAFETY) ×2 IMPLANT
DEVICE MILOOP (MISCELLANEOUS) IMPLANT
EYE SHIELD UNIVERSAL CLEAR (GAUZE/BANDAGES/DRESSINGS) ×2 IMPLANT
GLOVE BIOGEL PI IND STRL 7.0 (GLOVE) IMPLANT
GLOVE BIOGEL PI INDICATOR 7.0 (GLOVE) ×5
MILOOP DEVICE (MISCELLANEOUS)
NDL HYPO 18GX1.5 BLUNT FILL (NEEDLE) IMPLANT
NEEDLE HYPO 18GX1.5 BLUNT FILL (NEEDLE) ×4 IMPLANT
PAD ARMBOARD 7.5X6 YLW CONV (MISCELLANEOUS) ×2 IMPLANT
RING MALYGIN (MISCELLANEOUS) IMPLANT
RING MALYGIN 7.0 (MISCELLANEOUS) IMPLANT
SYR TB 1ML LL NO SAFETY (SYRINGE) ×2 IMPLANT
TAPE SURG TRANSPORE 1 IN (GAUZE/BANDAGES/DRESSINGS) IMPLANT
TAPE SURGICAL TRANSPORE 1 IN (GAUZE/BANDAGES/DRESSINGS) ×4
Tecnis 1 Piece intraoclular Lens (Intraocular Lens) ×1 IMPLANT
Tecnis 1 Piece intraocular Lens (Intraocular Lens) ×1 IMPLANT
VISCOELASTIC ADDITIONAL (OPHTHALMIC RELATED) IMPLANT
WATER STERILE IRR 250ML POUR (IV SOLUTION) ×2 IMPLANT

## 2020-04-15 NOTE — Op Note (Signed)
Date of procedure: 04/15/20  Pre-operative diagnosis: Mature, Visually significant age-related cataract, Left Eye (H25.22)  Post-operative diagnosis: Mature Visually significant age-related cataract, Left Eye  Procedure: Complex Removal of cataract via phacoemulsification and insertion of intra-ocular lens AMO DCB00 +22.0D into the capsular bag of the Left Eye  Attending surgeon: Gerda Diss. Laquenta Whitsell, MD, MA  Anesthesia: General, Topical Akten  Complications: None  Estimated Blood Loss: <74mL (minimal)  Specimens: None  Implants: As above  Indications:  Mature Visually significant age-related cataract, Left Eye  Procedure:   The patient's eye was marked in the pre-op area.  General anesthesia was continued after being inducted for the prior (right) eye.  A timeout was performed confirming the patient, procedure to be performed, and all other relevant information.   The patient's face was prepped and draped in the usual fashion for intra-ocular surgery.  A lid speculum was placed into the operative eye and the surgical microscope moved into place and focused.  A lack of red reflex due to a mature cataract was confirmed.  An inferotemporal paracentesis was created using a 20 gauge paracentesis blade.  Vision blue was injected into the anterior chamber.  Shugarcaine was injected into the anterior chamber.  Viscoelastic was injected into the anterior chamber.  A temporal clear-corneal main wound incision was created using a 2.40mm microkeratome.  A continuous curvilinear capsulorrhexis was initiated using an irrigating cystitome and completed using capsulorrhexis forceps.  Hydrodissection and hydrodeliniation were performed.  Viscoelastic was injected into the anterior chamber.  A phacoemulsification handpiece and a chopper as a second instrument were used to remove the nucleus and epinucleus. The irrigation/aspiration handpiece was used to remove any remaining cortical material.   The capsular  bag was reinflated with viscoelastic, checked, and found to be intact. The intraocular lens was inserted into the capsular bag and dialed into place using a kuglen hook.  The irrigation/aspiration handpiece was used to remove any remaining viscoelastic.  The clear corneal wound and paracentesis wounds were then hydrated and checked with Weck-Cels to be watertight.  The lid-speculum and drape was removed, and the patient's face was cleaned with a wet and dry 4x4.  Maxitrol was instilled in the eye before a clear shield was taped over the eye. The patient was taken to the post-operative care unit in good condition, having tolerated the procedure well.  Post-Op Instructions: The patient will follow up at [facility] for a same day post-operative evaluation and will receive all other orders and instructions.

## 2020-04-15 NOTE — Anesthesia Preprocedure Evaluation (Signed)
Anesthesia Evaluation  Patient identified by MRN, date of birth, ID band Patient awake    Reviewed: Allergy & Precautions, H&P , NPO status , Patient's Chart, lab work & pertinent test results, reviewed documented beta blocker date and time   Airway Mallampati: II  TM Distance: >3 FB Neck ROM: full    Dental no notable dental hx. (+) Missing   Pulmonary neg pulmonary ROS,    Pulmonary exam normal breath sounds clear to auscultation       Cardiovascular Exercise Tolerance: Good hypertension,  Rhythm:regular Rate:Normal     Neuro/Psych Seizures -,  PSYCHIATRIC DISORDERS  Neuromuscular disease    GI/Hepatic negative GI ROS, Neg liver ROS,   Endo/Other  negative endocrine ROSdiabetes  Renal/GU negative Renal ROS  negative genitourinary   Musculoskeletal negative musculoskeletal ROS (+)   Abdominal   Peds negative pediatric ROS (+)  Hematology  (+) Blood dyscrasia, anemia ,   Anesthesia Other Findings   Reproductive/Obstetrics negative OB ROS                             Anesthesia Physical Anesthesia Plan  ASA: III  Anesthesia Plan: MAC   Post-op Pain Management:    Induction:   PONV Risk Score and Plan:   Airway Management Planned:   Additional Equipment:   Intra-op Plan:   Post-operative Plan:   Informed Consent: I have reviewed the patients History and Physical, chart, labs and discussed the procedure including the risks, benefits and alternatives for the proposed anesthesia with the patient or authorized representative who has indicated his/her understanding and acceptance.     Dental Advisory Given  Plan Discussed with: CRNA  Anesthesia Plan Comments:         Anesthesia Quick Evaluation

## 2020-04-15 NOTE — Interval H&P Note (Signed)
History and Physical Interval Note:  04/15/2020 12:33 PM  Jeremy Parks  has presented today for surgery, with the diagnosis of Nuclear sclerotic cataract - Bilateral.  The various methods of treatment have been discussed with the patient and family. After consideration of risks, benefits and other options for treatment, the patient has consented to  Procedure(s) with comments: CATARACT EXTRACTION PHACO AND INTRAOCULAR LENS PLACEMENT ([place]) (Bilateral) - bilateral, pt will need RAPID covid test AM of procedure - lives at [facility] - guardian - Marinda Elk @ [phone] x [phone] as a surgical intervention.  The patient's history has been reviewed, patient examined, no change in status, stable for surgery.  I have reviewed the patient's chart and labs.  Questions were answered to the patient's satisfaction.     Baruch Goldmann

## 2020-04-15 NOTE — Anesthesia Procedure Notes (Signed)
Procedure Name: LMA Insertion Date/Time: 04/15/2020 1:35 PM Performed by: Karna Dupes, CRNA Pre-anesthesia Checklist: Patient identified, Emergency Drugs available, Suction available and Patient being monitored Patient Re-evaluated:Patient Re-evaluated prior to induction Oxygen Delivery Method: Circle system utilized Preoxygenation: Pre-oxygenation with 100% oxygen Induction Type: IV induction LMA: LMA inserted LMA Size: 4.0 Tube secured with: Tape Dental Injury: Teeth and Oropharynx as per pre-operative assessment

## 2020-04-15 NOTE — Op Note (Signed)
Date of procedure: 04/15/20  Pre-operative diagnosis:  Visually significant combined form age-related cataract, Right Eye (H25.811)  Post-operative diagnosis:  Visually significant combined form age-related cataract, Right Eye (H25.811)  Procedure: Removal of cataract via phacoemulsification and insertion of intra-ocular lens Wynetta Emery and [employer] DCB00  +18.5D into the capsular bag of the Right Eye  Attending surgeon: Gerda Diss. Holley Wirt, MD, MA  Anesthesia: General, Topical Akten  Complications: None  Estimated Blood Loss: <15mL (minimal)  Specimens: None  Implants: As above  Indications:  Visually significant age-related cataract, Right Eye  Procedure:  The patient was seen and identified in the pre-operative area. The operative eye was identified and dilated.  The operative eye was marked.  Topical anesthesia was administered to the operative eye.    The patient was then to the operative suite and placed in the supine position.  General anesthesia was administered.  A timeout was performed confirming the patient, procedure to be performed, and all other relevant information.   The patient's face was prepped and draped in the usual fashion for intra-ocular surgery.  A lid speculum was placed into the operative eye and the surgical microscope moved into place and focused.  A superotemporal paracentesis was created using a 20 gauge paracentesis blade.Vision blue was used to stain the anterior capsule.  Shugarcaine was injected into the anterior chamber.  Viscoelastic was injected into the anterior chamber.  A temporal clear-corneal main wound incision was created using a 2.77mm microkeratome.  A continuous curvilinear capsulorrhexis was initiated using an irrigating cystitome and completed using capsulorrhexis forceps.  Hydrodissection and hydrodeliniation were performed.  Viscoelastic was injected into the anterior chamber.  A phacoemulsification handpiece and a chopper as a second  instrument were used to remove the nucleus and epinucleus. The irrigation/aspiration handpiece was used to remove any remaining cortical material.   The capsular bag was reinflated with viscoelastic, checked, and found to be intact.  The intraocular lens was inserted into the capsular bag.  The irrigation/aspiration handpiece was used to remove any remaining viscoelastic.  The clear corneal wound and paracentesis wounds were then hydrated and checked with Weck-Cels to be watertight.  The lid-speculum was removed.  The drape was removed.  The patient's face was cleaned with a wet and dry 4x4.   Maxitrol was instilled in the eye. A clear shield was taped over the eye.   Post-Op Instructions: The patient will follow up at [facility] for a same day post-operative evaluation and will receive all other orders and instructions.

## 2020-04-15 NOTE — Transfer of Care (Signed)
Immediate Anesthesia Transfer of Care Note  Patient: Jeremy Parks  Procedure(s) Performed: CATARACT EXTRACTION PHACO AND INTRAOCULAR LENS PLACEMENT ([facility]) (Bilateral Eye)  Patient Location: PACU  Anesthesia Type:General  Level of Consciousness: awake  Airway & Oxygen Therapy: Patient Spontanous Breathing  Post-op Assessment: Report given to RN and Post -op Vital signs reviewed and stable  Post vital signs: Reviewed and stable  Last Vitals:  Vitals Value Taken Time  BP    Temp    Pulse    Resp    SpO2      Last Pain:  Vitals:   04/15/20 1131  TempSrc: Oral  PainSc: 0-No pain      Patients Stated Pain Goal: 5 (20/10/07 1219)  Complications: No complications documented.

## 2020-04-15 NOTE — Anesthesia Postprocedure Evaluation (Signed)
Anesthesia Post Note  Patient: Jeremy Parks  Procedure(s) Performed: CATARACT EXTRACTION PHACO AND INTRAOCULAR LENS PLACEMENT ([place]) (Bilateral Eye)  Patient location during evaluation: PACU Anesthesia Type: General Level of consciousness: sedated Pain management: pain level controlled Vital Signs Assessment: post-procedure vital signs reviewed and stable Respiratory status: spontaneous breathing Cardiovascular status: blood pressure returned to baseline Postop Assessment: no headache Anesthetic complications: no Comments: Back to cognitive baseline   No complications documented.   Last Vitals:  Vitals:   04/15/20 1508 04/15/20 1516  BP: 137/73 (!) 144/75  Pulse: 81 82  Resp: 13 12  Temp:  36.5 C  SpO2: 99% 100%    Last Pain:  Vitals:   04/15/20 1516  TempSrc: Axillary  PainSc: 0-No pain                 Louann Sjogren

## 2020-04-15 NOTE — Discharge Instructions (Addendum)
Please discharge patient when stable, will follow up today with Dr. Marisa Hua at the [facility] office immediately following discharge.  Leave shield in place until visit.  All paperwork with discharge instructions will be given at the office.  [facility] Address:  [place], [place] [address]    General Anesthesia, Adult, Care After This sheet gives you information about how to care for yourself after your procedure. Your health care provider may also give you more specific instructions. If you have problems or questions, contact your health care provider. What can I expect after the procedure? After the procedure, the following side effects are common:  Pain or discomfort at the IV site.  Nausea.  Vomiting.  Sore throat.  Trouble concentrating.  Feeling cold or chills.  Weak or tired.  Sleepiness and fatigue.  Soreness and body aches. These side effects can affect parts of the body that were not involved in surgery. Follow these instructions at home:  For at least 24 hours after the procedure:  Have a responsible adult stay with you. It is important to have someone help care for you until you are awake and alert.  Rest as needed.  Do not: ? Participate in activities in which you could fall or become injured. ? Drive. ? Use heavy machinery. ? Drink alcohol. ? Take sleeping pills or medicines that cause drowsiness. ? Make important decisions or sign legal documents. ? Take care of children on your own. Eating and drinking  Follow any instructions from your health care provider about eating or drinking restrictions.  When you feel hungry, start by eating small amounts of foods that are soft and easy to digest (bland), such as toast. Gradually return to your regular diet.  Drink enough fluid to keep your urine pale yellow.  If you vomit, rehydrate by drinking water, juice, or clear broth. General instructions  If  you have sleep apnea, surgery and certain medicines can increase your risk for breathing problems. Follow instructions from your health care provider about wearing your sleep device: ? Anytime you are sleeping, including during daytime naps. ? While taking prescription pain medicines, sleeping medicines, or medicines that make you drowsy.  Return to your normal activities as told by your health care provider. Ask your health care provider what activities are safe for you.  Take over-the-counter and prescription medicines only as told by your health care provider.  If you smoke, do not smoke without supervision.  Keep all follow-up visits as told by your health care provider. This is important. Contact a health care provider if:  You have nausea or vomiting that does not get better with medicine.  You cannot eat or drink without vomiting.  You have pain that does not get better with medicine.  You are unable to pass urine.  You develop a skin rash.  You have a fever.  You have redness around your IV site that gets worse. Get help right away if:  You have difficulty breathing.  You have chest pain.  You have blood in your urine or stool, or you vomit blood. Summary  After the procedure, it is common to have a sore throat or nausea. It is also common to feel tired.  Have a responsible adult stay with you for the first 24 hours after general anesthesia. It is important to have someone help care for you until you are awake and alert.  When you feel hungry, start by eating  small amounts of foods that are soft and easy to digest (bland), such as toast. Gradually return to your regular diet.  Drink enough fluid to keep your urine pale yellow.  Return to your normal activities as told by your health care provider. Ask your health care provider what activities are safe for you. This information is not intended to replace advice given to you by your health care provider. Make sure  you discuss any questions you have with your health care provider. Document Revised: 04/23/2017 Document Reviewed: 12/04/2016 Elsevier Patient Education  [place].

## 2020-04-16 ENCOUNTER — Encounter (HOSPITAL_COMMUNITY): Payer: Self-pay | Admitting: Ophthalmology

## 2020-05-01 DIAGNOSIS — Z23 Encounter for immunization: Secondary | ICD-10-CM | POA: Diagnosis not present

## 2020-05-02 ENCOUNTER — Inpatient Hospital Stay (HOSPITAL_COMMUNITY)
Admission: EM | Admit: 2020-05-02 | Discharge: 2020-05-04 | DRG: 689 | Disposition: A | Discharge status: Skilled Nursing Facility | Payer: Medicare Other | Source: Skilled Nursing Facility | Attending: Internal Medicine | Admitting: Internal Medicine

## 2020-05-02 ENCOUNTER — Other Ambulatory Visit: Payer: Self-pay

## 2020-05-02 ENCOUNTER — Encounter (HOSPITAL_COMMUNITY): Payer: Self-pay

## 2020-05-02 ENCOUNTER — Emergency Department (HOSPITAL_COMMUNITY): Payer: Medicare Other

## 2020-05-02 DIAGNOSIS — Z7982 Long term (current) use of aspirin: Secondary | ICD-10-CM

## 2020-05-02 DIAGNOSIS — N3000 Acute cystitis without hematuria: Secondary | ICD-10-CM | POA: Diagnosis present

## 2020-05-02 DIAGNOSIS — G808 Other cerebral palsy: Secondary | ICD-10-CM | POA: Diagnosis not present

## 2020-05-02 DIAGNOSIS — R4182 Altered mental status, unspecified: Secondary | ICD-10-CM

## 2020-05-02 DIAGNOSIS — R404 Transient alteration of awareness: Secondary | ICD-10-CM | POA: Diagnosis not present

## 2020-05-02 DIAGNOSIS — G40909 Epilepsy, unspecified, not intractable, without status epilepticus: Secondary | ICD-10-CM | POA: Diagnosis present

## 2020-05-02 DIAGNOSIS — G9341 Metabolic encephalopathy: Secondary | ICD-10-CM | POA: Diagnosis present

## 2020-05-02 DIAGNOSIS — I10 Essential (primary) hypertension: Secondary | ICD-10-CM | POA: Diagnosis present

## 2020-05-02 DIAGNOSIS — E119 Type 2 diabetes mellitus without complications: Secondary | ICD-10-CM | POA: Diagnosis present

## 2020-05-02 DIAGNOSIS — E785 Hyperlipidemia, unspecified: Secondary | ICD-10-CM | POA: Diagnosis present

## 2020-05-02 DIAGNOSIS — G809 Cerebral palsy, unspecified: Secondary | ICD-10-CM | POA: Diagnosis present

## 2020-05-02 DIAGNOSIS — Z79899 Other long term (current) drug therapy: Secondary | ICD-10-CM

## 2020-05-02 DIAGNOSIS — E871 Hypo-osmolality and hyponatremia: Secondary | ICD-10-CM | POA: Diagnosis present

## 2020-05-02 DIAGNOSIS — Z9841 Cataract extraction status, right eye: Secondary | ICD-10-CM | POA: Diagnosis not present

## 2020-05-02 DIAGNOSIS — Z9842 Cataract extraction status, left eye: Secondary | ICD-10-CM

## 2020-05-02 DIAGNOSIS — Z961 Presence of intraocular lens: Secondary | ICD-10-CM | POA: Diagnosis present

## 2020-05-02 DIAGNOSIS — Z20822 Contact with and (suspected) exposure to covid-19: Secondary | ICD-10-CM | POA: Diagnosis present

## 2020-05-02 DIAGNOSIS — R531 Weakness: Secondary | ICD-10-CM | POA: Diagnosis not present

## 2020-05-02 DIAGNOSIS — N390 Urinary tract infection, site not specified: Secondary | ICD-10-CM | POA: Diagnosis present

## 2020-05-02 DIAGNOSIS — R569 Unspecified convulsions: Secondary | ICD-10-CM

## 2020-05-02 LAB — CBC WITH DIFFERENTIAL/PLATELET
Abs Immature Granulocytes: 0.03 10*3/uL (ref 0.00–0.07)
Basophils Absolute: 0 10*3/uL (ref 0.0–0.1)
Basophils Relative: 0 %
Eosinophils Absolute: 0 10*3/uL (ref 0.0–0.5)
Eosinophils Relative: 0 %
HCT: 31.2 % — ABNORMAL LOW (ref 39.0–52.0)
Hemoglobin: 10.4 g/dL — ABNORMAL LOW (ref 13.0–17.0)
Immature Granulocytes: 1 %
Lymphocytes Relative: 22 %
Lymphs Abs: 1.1 10*3/uL (ref 0.7–4.0)
MCH: 28.8 pg (ref 26.0–34.0)
MCHC: 33.3 g/dL (ref 30.0–36.0)
MCV: 86.4 fL (ref 80.0–100.0)
Monocytes Absolute: 0.7 10*3/uL (ref 0.1–1.0)
Monocytes Relative: 14 %
Neutro Abs: 3.1 10*3/uL (ref 1.7–7.7)
Neutrophils Relative %: 63 %
Platelets: 169 10*3/uL (ref 150–400)
RBC: 3.61 MIL/uL — ABNORMAL LOW (ref 4.22–5.81)
RDW: 12.9 % (ref 11.5–15.5)
WBC: 5 10*3/uL (ref 4.0–10.5)
nRBC: 0 % (ref 0.0–0.2)

## 2020-05-02 LAB — URINALYSIS, ROUTINE W REFLEX MICROSCOPIC
Bilirubin Urine: NEGATIVE
Glucose, UA: NEGATIVE mg/dL
Hgb urine dipstick: NEGATIVE
Ketones, ur: NEGATIVE mg/dL
Nitrite: POSITIVE — AB
Protein, ur: >=300 mg/dL — AB
Specific Gravity, Urine: 1.016 (ref 1.005–1.030)
WBC, UA: >50 WBC/hpf — ABNORMAL HIGH (ref 0–5)
pH: 5 (ref 5.0–8.0)

## 2020-05-02 LAB — COMPREHENSIVE METABOLIC PANEL
ALT: 15 U/L (ref 0–44)
AST: 24 U/L (ref 15–41)
Albumin: 3.9 g/dL (ref 3.5–5.0)
Alkaline Phosphatase: 98 U/L (ref 38–126)
Anion gap: 10 (ref 5–15)
BUN: 12 mg/dL (ref 8–23)
CO2: 24 mmol/L (ref 22–32)
Calcium: 8.4 mg/dL — ABNORMAL LOW (ref 8.9–10.3)
Chloride: 93 mmol/L — ABNORMAL LOW (ref 98–111)
Creatinine, Ser: 0.87 mg/dL (ref 0.61–1.24)
GFR, Estimated: >60 mL/min (ref >60)
Glucose, Bld: 92 mg/dL (ref 70–99)
Potassium: 4.4 mmol/L (ref 3.5–5.1)
Sodium: 127 mmol/L — ABNORMAL LOW (ref 135–145)
Total Bilirubin: 0.4 mg/dL (ref 0.3–1.2)
Total Protein: 7.2 g/dL (ref 6.5–8.1)

## 2020-05-02 LAB — LACTIC ACID, PLASMA: Lactic Acid, Venous: 1.1 mmol/L (ref 0.5–1.9)

## 2020-05-02 LAB — RESP PANEL BY RT-PCR (FLU A&B, COVID) ARPGX2
Influenza A by PCR: NEGATIVE
Influenza B by PCR: NEGATIVE
SARS Coronavirus 2 by RT PCR: NEGATIVE

## 2020-05-02 LAB — PROTIME-INR
INR: 1.1 (ref 0.8–1.2)
Prothrombin Time: 13.3 seconds (ref 11.4–15.2)

## 2020-05-02 MED ORDER — IBUPROFEN 400 MG PO TABS: 600.0000 mg | ORAL_TABLET | Freq: Once | ORAL | Status: DC

## 2020-05-02 MED ORDER — DEXTROSE-NACL 5-0.9 % IV SOLN: INTRAVENOUS | Status: DC

## 2020-05-02 MED ORDER — SODIUM CHLORIDE 0.9 % IV BOLUS
1000.0000 mL | Freq: Once | INTRAVENOUS | Status: AC
  Administered 2020-05-02: 1000 mL via INTRAVENOUS

## 2020-05-02 MED ORDER — ONDANSETRON HCL 4 MG PO TABS: 4.0000 mg | ORAL_TABLET | Freq: Four times a day (QID) | ORAL | Status: DC | PRN

## 2020-05-02 MED ORDER — ACETAMINOPHEN 325 MG PO TABS: 650.0000 mg | ORAL_TABLET | Freq: Four times a day (QID) | ORAL | Status: DC | PRN

## 2020-05-02 MED ORDER — LORAZEPAM 2 MG/ML IJ SOLN: 1.0000 mg | INTRAMUSCULAR | Status: DC | PRN

## 2020-05-02 MED ORDER — ENOXAPARIN SODIUM 40 MG/0.4ML ~~LOC~~ SOLN
40.0000 mg | SUBCUTANEOUS | Status: DC
  Administered 2020-05-02 – 2020-05-03 (×2): 40 mg via SUBCUTANEOUS
  Filled 2020-05-02 (×3): qty 0.4

## 2020-05-02 MED ORDER — ACETAMINOPHEN 650 MG RE SUPP
650.0000 mg | Freq: Four times a day (QID) | RECTAL | Status: DC | PRN
  Administered 2020-05-02: 650 mg via RECTAL
  Filled 2020-05-02: qty 1

## 2020-05-02 MED ORDER — ONDANSETRON HCL 4 MG/2ML IJ SOLN: 4.0000 mg | Freq: Four times a day (QID) | INTRAMUSCULAR | Status: DC | PRN

## 2020-05-02 NOTE — ED Provider Notes (Signed)
[facility] EMERGENCY DEPARTMENT Provider Note   CSN: [id number] Arrival date & time: 05/02/20  1259     History Chief Complaint  Patient presents with  . Altered Mental Status    Jeremy Parks is a 69 y.o. male.  HPI   69 year old male with history of cerebral palsy, diabetes, gingivitis, seizures, who presents to the emergency department today for evaluation of altered mental status.  Per EMS report patient coming from the [facility].  He had a fever there of 103 and was lethargic.  He was given Tylenol as well as 2 g of Rocephin prior to arrival and sent here for further evaluation.  Level 5 caveat as patient is unable to provide any history.  NP called and spoke with Dr. Effie Shy, supervising physician and stated that pt had his covid booster shot yesterday.  1:43 PM Discussed case with staff at the [facility]. They state that the nurse who takes care of the patient is unavailable and will call me back with information  Past Medical History:  Diagnosis Date  . Cerebral palsy (HCC)   . Cerebral palsy (HCC)   . Diabetes mellitus without complication (HCC)   . Gingivitis   . Lipoma of abdominal wall   . Seizures [facility])     Patient Active Problem List   Diagnosis Date Noted  . Metabolic encephalopathy 05/02/2020  . UTI (urinary tract infection) 05/02/2020  . Palliative care by specialist   . Goals of care, counseling/discussion   . Urinary tract infection with hematuria   . Delirium   . Pneumonia due to COVID-19 virus 04/27/2019  . Normocytic anemia 04/27/2019  . Hyperglycemia 04/27/2019  . Hyperlipidemia 04/27/2019  . Essential hypertension 10/30/2018  . Hypertensive urgency 10/28/2018  . Cerebral palsy (HCC)   . Diabetes mellitus without complication (HCC)   . Seizures (HCC)     Past Surgical History:  Procedure Laterality Date  . CATARACT EXTRACTION W/PHACO Bilateral 04/15/2020   Procedure: CATARACT EXTRACTION PHACO AND INTRAOCULAR LENS PLACEMENT (IOC);   Surgeon: Fabio Pierce, MD;  Location: [facility];  Service: Ophthalmology;  Laterality: Bilateral;  Right eye-22.63 Left Eye-24.93  . seizure     last one over a year per [facility] staff       Family History  Family history unknown: Yes    Social History   Tobacco Use  . Smoking status: Never Smoker  . Smokeless tobacco: Never Used  Substance Use Topics  . Alcohol use: No  . Drug use: No    Home Medications Prior to Admission medications   Medication Sig Start Date End Date Taking? Authorizing Provider  amLODipine (NORVASC) 10 MG tablet Take 1 tablet (10 mg total) by mouth daily. 10/31/18   Kari Baars, MD  ascorbic acid (VITAMIN C) 500 MG tablet Take 500 mg by mouth 2 (two) times daily.    [provider]  aspirin 81 MG chewable tablet Chew 81 mg by mouth daily.    [provider]  atorvastatin (LIPITOR) 40 MG tablet Take 40 mg by mouth at bedtime.     [provider]  carbamazepine (TEGRETOL) 200 MG tablet Take 1.5 tablets (300 mg total) by mouth 2 (two) times daily. 10/31/16   Lavera Guise, MD  chlorhexidine (PERIDEX) 0.12 % solution Use as directed 15 mLs in the mouth or throat daily. **Swish 15 mls for 30 seconds then spit out. USE DAILY**    [provider]  docusate (COLACE) 50 MG/5ML liquid Take  100 mg by mouth 2 (two) times daily as needed for mild constipation.    [provider]  ferrous sulfate 325 (65 FE) MG tablet Take 325 mg by mouth 2 (two) times daily with a meal.    [provider]  polyethylene glycol (MIRALAX / GLYCOLAX) 17 g packet Take 17 g by mouth daily.    [provider]    Allergies    Patient has no known allergies.  Review of Systems   Review of Systems  Unable to perform ROS: Mental status change  Constitutional: Positive for fever.    Physical Exam Updated Vital Signs BP (!) 148/70   Pulse 90   Temp 99.3 F (37.4 C) (Rectal)   Resp 14   Ht 5\' 8"  (1.727 m)   Wt 62.6 kg    SpO2 99%   BMI 20.98 kg/m   Physical Exam Vitals and nursing note reviewed.  Constitutional:      Appearance: He is well-developed and well-nourished.  HENT:     Head: Normocephalic and atraumatic.  Eyes:     Conjunctiva/sclera: Conjunctivae normal.  Cardiovascular:     Rate and Rhythm: Normal rate and regular rhythm.     Heart sounds: Normal heart sounds. No murmur heard.   Pulmonary:     Effort: Pulmonary effort is normal. No respiratory distress.     Breath sounds: Rales (faint bibasilar rales) present.  Abdominal:     General: Bowel sounds are normal.     Palpations: Abdomen is soft.     Tenderness: There is no abdominal tenderness.  Musculoskeletal:        General: No edema.     Cervical back: Neck supple.  Skin:    General: Skin is warm and dry.  Neurological:     Mental Status: He is alert.     Comments: Eyes open to voice and noxious stimuli. Otherwise nonverbal and does not follow commands.   Psychiatric:        Mood and Affect: Mood and affect normal.     ED Results / Procedures / Treatments   Labs (all labs ordered are listed, but only abnormal results are displayed) Labs Reviewed  COMPREHENSIVE METABOLIC PANEL - Abnormal; Notable for the following components:      Result Value   Sodium 127 (*)    Chloride 93 (*)    Calcium 8.4 (*)    All other components within normal limits  CBC WITH DIFFERENTIAL/PLATELET - Abnormal; Notable for the following components:   RBC 3.61 (*)    Hemoglobin 10.4 (*)    HCT 31.2 (*)    All other components within normal limits  URINALYSIS, ROUTINE W REFLEX MICROSCOPIC - Abnormal; Notable for the following components:   APPearance CLOUDY (*)    Protein, ur >=300 (*)    Nitrite POSITIVE (*)    Leukocytes,Ua LARGE (*)    WBC, UA >50 (*)    Bacteria, UA FEW (*)    All other components within normal limits  CULTURE, BLOOD (ROUTINE X 2)  RESP PANEL BY RT-PCR (FLU A&B, COVID) ARPGX2  CULTURE, BLOOD (ROUTINE X 2)  URINE  CULTURE  LACTIC ACID, PLASMA  PROTIME-INR  HIV ANTIBODY (ROUTINE TESTING W REFLEX)  BASIC METABOLIC PANEL  CBC  POC SARS CORONAVIRUS 2 AG -  ED    EKG EKG Interpretation  Date/Time:  Thursday May 02 2020 13:13:39 EST Ventricular Rate:  84 PR Interval:    QRS Duration: 136 QT Interval:  353  QTC Calculation: 418 R Axis:   46 Text Interpretation: indeterminate rhythym Artifact Nonspecific intraventricular conduction delay Artifact in lead(s) I II III aVR aVL aVF V1 V2 V3 V4 V5 Serial tracing suggested Confirmed by Daleen Bo [phone]) on 05/02/2020 1:56:42 PM   Radiology DG Chest 1 View  Result Date: 05/02/2020 CLINICAL DATA:  Altered mental status.  Possible sepsis. EXAM: CHEST  1 VIEW COMPARISON:  Single-view of the chest 05/03/2019. FINDINGS: Lung volumes are low but the lungs are clear. Heart size is normal. No pneumothorax or pleural fluid. No acute or focal bony abnormality. IMPRESSION: No acute disease. Electronically Signed   By: Inge Rise M.D.   On: 05/02/2020 13:55   CT Head Wo Contrast  Result Date: 05/02/2020 CLINICAL DATA:  Fever and lethargy. EXAM: CT HEAD WITHOUT CONTRAST TECHNIQUE: Contiguous axial images were obtained from the base of the skull through the vertex without intravenous contrast. COMPARISON:  Head CT 04/26/2019. FINDINGS: Brain: No evidence of acute infarction, hemorrhage, hydrocephalus, extra-axial collection or mass lesion/mass effect. Mild chronic microvascular ischemic change noted. Vascular: No hyperdense vessel or unexpected calcification. Skull: Intact.  No focal lesion. Sinuses/Orbits: Status post cataract surgery. Otherwise unremarkable. Other: None. IMPRESSION: No acute abnormality. Electronically Signed   By: Inge Rise M.D.   On: 05/02/2020 14:21    Procedures Procedures (including critical care time)  Medications Ordered in ED Medications  enoxaparin (LOVENOX) injection 40 mg (has no administration in time range)   dextrose 5 %-0.9 % sodium chloride infusion (has no administration in time range)  acetaminophen (TYLENOL) tablet 650 mg (has no administration in time range)    Or  acetaminophen (TYLENOL) suppository 650 mg (has no administration in time range)  ondansetron (ZOFRAN) tablet 4 mg (has no administration in time range)    Or  ondansetron (ZOFRAN) injection 4 mg (has no administration in time range)  LORazepam (ATIVAN) injection 1-2 mg (has no administration in time range)  sodium chloride 0.9 % bolus 1,000 mL (0 mLs Intravenous Stopped 05/02/20 1815)    ED Course  I have reviewed the triage vital signs and the nursing notes.  Pertinent labs & imaging results that were available during my care of the patient were reviewed by me and considered in my medical decision making (see chart for details).  Clinical Course as of 05/02/20 1906  Thu May 02, 2020  1442 I received a call from Dr. Yehuda Savannah, [phone]; he is taking care of the patient at the [facility].  He sent the patient here with concern for possible Covid vaccine reaction versus infectious process, contributing to altered mental status, different from baseline.  Typically the patient can communicate, and can state his name. Dr. Malachy Moan states patient can return to the facility, if he is stabilized, and no ongoing medical problems are uncovered. [EW]    Clinical Course User Index [EW] Daleen Bo, MD   MDM Rules/Calculators/A&P                          69 year old male presenting from SNF for evaluation of fever and altered mental status. He received Tylenol and Rocephin prior to arrival. At baseline oriented to self only. CBC, CMP, lactic acid are reassuring.  Chest x-ray without acute findings.  CT head negative  UA shows nitrites, leukocytes, WBCs, RBCs and white blood cell clumps consistent with UTI.  Urine culture sent.  He received Rocephin prior to arrival.  He was given IV fluids here  in the ED as well as  antipyretics.  Will admit for further management.  Consult with Dr. Denton Brick with hospitalist service who accepts patient for admission for UTI with acute metabolic encephalopathy.  Final Clinical Impression(s) / ED Diagnoses Final diagnoses:  Acute cystitis without hematuria  Altered mental status, unspecified altered mental status type    Rx / DC Orders ED Discharge Orders    None       Rodney Booze, PA-C 05/02/20 1906    Daleen Bo, MD 05/03/20 1642

## 2020-05-02 NOTE — ED Provider Notes (Signed)
  Face-to-face evaluation   History: Patient presents for evaluation of altered mental status and fever after getting his Covid vaccine booster yesterday.  He is unable to give history.  At baseline patient has CP, and is oriented only to person.  Physical exam: Patient lying supine, opens eyes to voice, states his name is Jeremy Parks.  No respiratory distress.  Abdomen soft and nontender.  I evaluated the patient at 2:50 PM  Clinical Course as of 05/02/20 1449  Thu May 02, 2020  1442 I received a call from Dr. Valetta Mole, [phone]; he is taking care of the patient at the [facility].  He sent the patient here with concern for possible Covid vaccine reaction versus infectious process, contributing to altered mental status, different from baseline.  Typically the patient can communicate, and can state his name. Dr. Jillene Bucks states patient can return to the facility, if he is stabilized, and no ongoing medical problems are uncovered. [EW]    Clinical Course User Index [EW] Mancel Bale, MD    Medical screening examination/treatment/procedure(s) were conducted as a shared visit with non-physician practitioner(s) and myself.  I personally evaluated the patient during the encounter    Mancel Bale, MD 05/03/20 [phone]

## 2020-05-02 NOTE — H&P (Signed)
History and Physical    AVYAN Parks [id number] DOB: 03/17/1951 DOA: 05/02/2020  PCP: Neale Burly, MD   Jeremy Parks coming from: [facility] [place].  I have personally briefly reviewed Jeremy Parks's old medical records in [place]  Chief Complaint: AMS, fever  HPI: Jeremy Parks is Jeremy 69 y.o. male with medical history significant for cerebral palsy, diabetes mellitus, hypertension, seizures. Jeremy Parks was brought to the ED from [facility] reports of Jeremy fever of 103.  Jeremy Parks is also lethargic.  Jeremy Parks received his booster Covid shot yesterday.  At the time of my evaluation, Jeremy Parks is awake, appears lethargic, purposefully closing his eyes and not answering questions.  Jeremy Parks was febrile to 103 at the nursing home, he was given Tylenol and Jeremy dose of Rocephin and sent to the ED. Called Nursing home- No response.   ED Course: Febrile to 100.8, heart rate 80s to 90s, blood pressure systolic [id number].  O2 sats  >  98% on room air WBC 5.  UA with positive nitrites, large leukocytes few bacteria.  Lactic acid 1.1.  Portable chest x-ray without acute abnormality.  EKG shows sinus rhythm.  Head CT without acute abnormality.  Blood and urine cultures obtained.  1 L bolus given.  Rocephin given prior to arrival.  Hospitalist admit for metabolic encephalopathy and UTI.  Review of Systems: Limited by Jeremy Parks's mental status.  Past Medical History:  Diagnosis Date  . Cerebral palsy ([place])   . Cerebral palsy ([place])   . Diabetes mellitus without complication ([place])   . Gingivitis   . Lipoma of abdominal wall   . Seizures ([place])     Past Surgical History:  Procedure Laterality Date  . CATARACT EXTRACTION W/PHACO Bilateral 04/15/2020   Procedure: CATARACT EXTRACTION PHACO AND INTRAOCULAR LENS PLACEMENT (IOC);  Surgeon: Baruch Goldmann, MD;  Location: [facility];  Service: Ophthalmology;  Laterality: Bilateral;  Right eye-22.63 Left Eye-24.93  . seizure     last one over Jeremy year per [facility]  staff     reports that he has never smoked. He has never used smokeless tobacco. He reports that he does not drink alcohol and does not use drugs.  No Known Allergies  Family History  Family history unknown: Yes    Prior to Admission medications   Medication Sig Start Date End Date Taking? Authorizing Provider  amLODipine (NORVASC) 10 MG tablet Take 1 tablet (10 mg total) by mouth daily. 10/31/18   Sinda Du, MD  ascorbic acid (VITAMIN C) 500 MG tablet Take 500 mg by mouth 2 (two) times daily.    [provider]  aspirin 81 MG chewable tablet Chew 81 mg by mouth daily.    [provider]  atorvastatin (LIPITOR) 40 MG tablet Take 40 mg by mouth at bedtime.     [provider]  carbamazepine (TEGRETOL) 200 MG tablet Take 1.5 tablets (300 mg total) by mouth 2 (two) times daily. 10/31/16   Forde Dandy, MD  chlorhexidine (PERIDEX) 0.12 % solution Use as directed 15 mLs in the mouth or throat daily. **Swish 15 mls for 30 seconds then spit out. USE DAILY**    [provider]  docusate (COLACE) 50 MG/5ML liquid Take 100 mg by mouth 2 (two) times daily as needed for mild constipation.    [provider]  ferrous sulfate 325 (65 FE) MG tablet Take 325 mg by mouth 2 (two) times daily with Jeremy meal.    [provider]  polyethylene  glycol (MIRALAX / GLYCOLAX) 17 g packet Take 17 g by mouth daily.    [provider]    Physical Exam: Exam limited by Jeremy Parks's mental status Vitals:   05/02/20 1400 05/02/20 1500 05/02/20 1522 05/02/20 1530  BP: 118/63 132/63  138/67  Pulse: 80 83  82  Resp: 14 12  12   Temp:   (!) 100.8 F (38.2 C)   TempSrc:   Rectal   SpO2: 100% 100%  98%  Weight:      Height:        Constitutional: Awake, but not following directions, calm Vitals:   05/02/20 1400 05/02/20 1500 05/02/20 1522 05/02/20 1530  BP: 118/63 132/63  138/67  Pulse: 80 83  82  Resp: 14 12  12   Temp:   (!) 100.8 F (38.2 C)    TempSrc:   Rectal   SpO2: 100% 100%  98%  Weight:      Height:       Eyes: PERRL, lids and conjunctivae normal ENMT: Mucous membranes are moist. Posterior pharynx clear of any exudate or lesions.Normal dentition.  Neck: normal, supple, no masses, no thyromegaly Respiratory: clear to auscultation bilaterally, no wheezing, no crackles. Normal respiratory effort. No accessory muscle use.  Cardiovascular: Regular rate and rhythm, no murmurs / rubs / gallops. No extremity edema. 2+ pedal pulses. No carotid bruits.  Abdomen: no tenderness, no masses palpated. No hepatosplenomegaly. Bowel sounds positive.  Musculoskeletal: no clubbing / cyanosis.  Bilateral upper extremities appear contracted-flexion.  Lower extremities do not appear to have contractures. Skin: no rashes, lesions, ulcers. No induration Neurologic: Exam limited by Jeremy Parks's altered mental status and baseline cerebral palsy, mouth slightly deviated to right, contractures to upper extremities.  Moving lower extremities to touch. Psychiatric: Awake, lethargic, not responding verbally.  Labs on Admission: I have personally reviewed following labs and imaging studies  CBC: Recent Labs  Lab 05/02/20 1325  WBC 5.0  NEUTROABS 3.1  HGB 10.4*  HCT 31.2*  MCV 86.4  PLT 169   Basic Metabolic Panel: Recent Labs  Lab 05/02/20 1325  NA 127*  K 4.4  CL 93*  CO2 24  GLUCOSE 92  BUN 12  CREATININE 0.87  CALCIUM 8.4*   Liver Function Tests: Recent Labs  Lab 05/02/20 1325  AST 24  ALT 15  ALKPHOS 98  BILITOT 0.4  PROT 7.2  ALBUMIN 3.9   Coagulation Profile: Recent Labs  Lab 05/02/20 1325  INR 1.1   Urine analysis:    Component Value Date/Time   COLORURINE YELLOW 05/02/2020 1325   APPEARANCEUR CLOUDY (Jeremy) 05/02/2020 1325   LABSPEC 1.016 05/02/2020 1325   PHURINE 5.0 05/02/2020 1325   GLUCOSEU NEGATIVE 05/02/2020 1325   HGBUR NEGATIVE 05/02/2020 1325   BILIRUBINUR NEGATIVE 05/02/2020 1325   KETONESUR NEGATIVE  05/02/2020 1325   PROTEINUR >=300 (Jeremy) 05/02/2020 1325   UROBILINOGEN 0.2 07/25/2014 1828   NITRITE POSITIVE (Jeremy) 05/02/2020 1325   LEUKOCYTESUR LARGE (Jeremy) 05/02/2020 1325    Radiological Exams on Admission: DG Chest 1 View  Result Date: 05/02/2020 CLINICAL DATA:  Altered mental status.  Possible sepsis. EXAM: CHEST  1 VIEW COMPARISON:  Single-view of the chest 05/03/2019. FINDINGS: Lung volumes are low but the lungs are clear. Heart size is normal. No pneumothorax or pleural fluid. No acute or focal bony abnormality. IMPRESSION: No acute disease. Electronically Signed   By: 05/04/2020 M.D.   On: 05/02/2020 13:55   CT Head Wo Contrast  Result Date: 05/02/2020 CLINICAL  DATA:  Fever and lethargy. EXAM: CT HEAD WITHOUT CONTRAST TECHNIQUE: Contiguous axial images were obtained from the base of the skull through the vertex without intravenous contrast. COMPARISON:  Head CT 04/26/2019. FINDINGS: Brain: No evidence of acute infarction, hemorrhage, hydrocephalus, extra-axial collection or mass lesion/mass effect. Mild chronic microvascular ischemic change noted. Vascular: No hyperdense vessel or unexpected calcification. Skull: Intact.  No focal lesion. Sinuses/Orbits: Status post cataract surgery. Otherwise unremarkable. Other: None. IMPRESSION: No acute abnormality. Electronically Signed   By: Inge Rise M.D.   On: 05/02/2020 14:21    EKG: Independently reviewed.  Artifacts present.  But sinus rhythm, rate 82.  QTc 427.  No appreciable abnormalities.  Assessment/Plan Principal Problem:   Metabolic encephalopathy Active Problems:   UTI (urinary tract infection)   Cerebral palsy (HCC)   Diabetes mellitus without complication (HCC)   Seizures (HCC)   Essential hypertension  Metabolic encephalopathy with baseline cerebral palsy- likely secondary to UTI. Head CT without acute abnormality.  Covid booster dose may be contributing to the fever but not so much to encephalopathy. -IV  antibiotics, IV fluids. -Remain n.p.o. - D5 N/s 75cc/hr X 20hrs  Urinary tract infection-febrile to 100.8.  Otherwise stable vitals.  WBC 5.  Lactic acid 1.1.  Rules out for sepsis.  UA consistent with UTI with positive nitrite large leukocytes and few bacteria.  Urine cultures from 04/2019-pansensitive Proteus mirabilis resistant only to nitrofurantoin. I am unable to confirm that IV Rocephin was given at nursing home - IV rocephin 1 g daily  -Follow-up urine cultures and blood cultures.  Hyponatremia-subacute/chronic sodium 127.  3 weeks ago sodium was 128.  Otherwise baseline Jeremy year ago appears to be 138-145. -IV fluids normal saline  Diabetes mellitus, random glucose 92.  Recent hemoglobin A1c 5.4. Not on medications. -Daily fasting blood glucose -Will add D5 to fluids while n.p.o.  Seizures -As needed Ativan -Tegretol held for now, as soon as Jeremy Parks is able to take p.o. - seizure precautions  Hypertension-stable. -Hold Norvasc for now while NPO.   DVT prophylaxis: Lovenox Code Status: Full code Family Communication: None at bedside.  Called nursing home [facility], no response. Disposition Plan:  ~ 2 days, pending improvement in mental status while awaiting undergoing treatment for UTI. Consults called: none Admission status: Inpatient, telemetry I certify that at the point of admission it is my clinical judgment that the Jeremy Parks will require inpatient hospital care spanning beyond 2 midnights from the point of admission due to high intensity of service, high risk for further deterioration and high frequency of surveillance required. The following factors support the Jeremy Parks status of inpatient: Metabolic encephalopathy with UTI current IV antibiotics pending culture results.   Bethena Roys MD [facility]  05/02/2020, 6:33 PM

## 2020-05-02 NOTE — ED Triage Notes (Signed)
From [facility], reports of fever 103 and lethargic. Gave tylenol and 2g Rocephin PTA.

## 2020-05-02 NOTE — ED Notes (Signed)
Update provided to Eliberto Ivory, LPN from [facility], CB# [phone]. Made aware of pt admission status and listed admitting diagnosis.

## 2020-05-03 ENCOUNTER — Other Ambulatory Visit: Payer: Self-pay

## 2020-05-03 DIAGNOSIS — R569 Unspecified convulsions: Secondary | ICD-10-CM | POA: Diagnosis not present

## 2020-05-03 DIAGNOSIS — I10 Essential (primary) hypertension: Secondary | ICD-10-CM

## 2020-05-03 DIAGNOSIS — N3000 Acute cystitis without hematuria: Secondary | ICD-10-CM | POA: Diagnosis not present

## 2020-05-03 DIAGNOSIS — G9341 Metabolic encephalopathy: Secondary | ICD-10-CM

## 2020-05-03 DIAGNOSIS — E871 Hypo-osmolality and hyponatremia: Secondary | ICD-10-CM

## 2020-05-03 LAB — BASIC METABOLIC PANEL
Anion gap: 8 (ref 5–15)
BUN: 11 mg/dL (ref 8–23)
CO2: 24 mmol/L (ref 22–32)
Calcium: 8.6 mg/dL — ABNORMAL LOW (ref 8.9–10.3)
Chloride: 96 mmol/L — ABNORMAL LOW (ref 98–111)
Creatinine, Ser: 0.68 mg/dL (ref 0.61–1.24)
GFR, Estimated: >60 mL/min (ref >60)
Glucose, Bld: 100 mg/dL — ABNORMAL HIGH (ref 70–99)
Potassium: 4.1 mmol/L (ref 3.5–5.1)
Sodium: 128 mmol/L — ABNORMAL LOW (ref 135–145)

## 2020-05-03 LAB — CBC
HCT: 34.5 % — ABNORMAL LOW (ref 39.0–52.0)
Hemoglobin: 11.3 g/dL — ABNORMAL LOW (ref 13.0–17.0)
MCH: 28.8 pg (ref 26.0–34.0)
MCHC: 32.8 g/dL (ref 30.0–36.0)
MCV: 87.8 fL (ref 80.0–100.0)
Platelets: 151 10*3/uL (ref 150–400)
RBC: 3.93 MIL/uL — ABNORMAL LOW (ref 4.22–5.81)
RDW: 12.8 % (ref 11.5–15.5)
WBC: 4.8 10*3/uL (ref 4.0–10.5)
nRBC: 0 % (ref 0.0–0.2)

## 2020-05-03 LAB — HIV ANTIBODY (ROUTINE TESTING W REFLEX): HIV Screen 4th Generation wRfx: NONREACTIVE

## 2020-05-03 LAB — CBG MONITORING, ED: Glucose-Capillary: 86 mg/dL (ref 70–99)

## 2020-05-03 MED ORDER — ATORVASTATIN CALCIUM 40 MG PO TABS
40.0000 mg | ORAL_TABLET | Freq: Every day | ORAL | Status: DC
  Administered 2020-05-03 – 2020-05-04 (×2): 40 mg via ORAL
  Filled 2020-05-03 (×3): qty 1

## 2020-05-03 MED ORDER — AMLODIPINE BESYLATE 5 MG PO TABS
5.0000 mg | ORAL_TABLET | Freq: Every day | ORAL | Status: DC
  Administered 2020-05-03 – 2020-05-04 (×2): 5 mg via ORAL
  Filled 2020-05-03 (×2): qty 1

## 2020-05-03 MED ORDER — SODIUM CHLORIDE 0.9 % IV SOLN
1.0000 g | INTRAVENOUS | Status: DC
  Administered 2020-05-03 – 2020-05-04 (×2): 1 g via INTRAVENOUS
  Filled 2020-05-03 (×2): qty 10

## 2020-05-03 MED ORDER — ASPIRIN EC 81 MG PO TBEC
81.0000 mg | DELAYED_RELEASE_TABLET | Freq: Every day | ORAL | Status: DC
  Administered 2020-05-03 – 2020-05-04 (×2): 81 mg via ORAL
  Filled 2020-05-03 (×2): qty 1

## 2020-05-03 MED ORDER — CARBAMAZEPINE 200 MG PO TABS
300.0000 mg | ORAL_TABLET | Freq: Two times a day (BID) | ORAL | Status: DC
  Administered 2020-05-03 – 2020-05-04 (×3): 300 mg via ORAL
  Filled 2020-05-03: qty 2
  Filled 2020-05-03 (×5): qty 1.5
  Filled 2020-05-03 (×3): qty 2
  Filled 2020-05-03 (×2): qty 1.5

## 2020-05-03 NOTE — Progress Notes (Signed)
SLP Cancellation Note  Patient Details Name: Jeremy Parks MRN: [id number] DOB: 07/22/1950   Cancelled treatment:       Reason Eval/Treat Not Completed: SLP screened, no needs identified, will sign off. Pt was re-administered the [place] swallow screen and PASSED. Pt is tolerating regular diet, BSE not indicated at this time. Please re-order BSE if needed, thank you,  Kayleigh Broadwell H. Romie Levee, CCC-SLP Speech Language Pathologist    Georgetta Haber 05/03/2020, 1:12 PM

## 2020-05-03 NOTE — ED Notes (Signed)
ED TO INPATIENT HANDOFF REPORT  ED Nurse Name and Phone #:   S Name/Age/Gender Jeremy Parks 69 y.o. male Room/Bed: APA07/APA07  Code Status   Code Status: Full Code  Home/SNF/Other Nursing Home Patient oriented to: self, place, time and situation Is this baseline? Yes   Triage Complete: Triage complete  Chief Complaint Metabolic encephalopathy [G93.41]  Triage Note From [facility], reports of fever 103 and lethargic. Gave tylenol and 2g Rocephin PTA.    Allergies No Known Allergies  Level of Care/Admitting Diagnosis ED Disposition    ED Disposition Condition Comment   Admit  Hospital Area: [facility] [[id number]]  Level of Care: Telemetry [5]  Covid Evaluation: Confirmed COVID Negative  Diagnosis: Metabolic encephalopathy [348.31.ICD-9-CM]  Admitting Physician: Onnie Boer [[id number]]  Attending Physician: Onnie Boer [phone]  Estimated length of stay: past midnight tomorrow  Certification:: I certify this patient will need inpatient services for at least 2 midnights       B Medical/Surgery History Past Medical History:  Diagnosis Date  . Cerebral palsy (HCC)   . Cerebral palsy (HCC)   . Diabetes mellitus without complication (HCC)   . Gingivitis   . Lipoma of abdominal wall   . Seizures (HCC)    Past Surgical History:  Procedure Laterality Date  . CATARACT EXTRACTION W/PHACO Bilateral 04/15/2020   Procedure: CATARACT EXTRACTION PHACO AND INTRAOCULAR LENS PLACEMENT (IOC);  Surgeon: Fabio Pierce, MD;  Location: [facility];  Service: Ophthalmology;  Laterality: Bilateral;  Right eye-22.63 Left Eye-24.93  . seizure     last one over a year per [facility] staff     A IV Location/Drains/Wounds Patient Lines/Drains/Airways Status    Active Line/Drains/Airways    Name Placement date Placement time Site Days   Peripheral IV 05/02/20 Left Forearm 05/02/20  1430  Forearm  1   External Urinary Catheter 05/03/20  0553  --  less than 1    Incision (Closed) 04/15/20 Eye Right 04/15/20  1349  -- 18   Incision (Closed) 04/15/20 Eye Left 04/15/20  1349  -- 18          Intake/Output Last 24 hours  Intake/Output Summary (Last 24 hours) at 05/03/2020 1306 Last data filed at 05/03/2020 1231 Gross per 24 hour  Intake 2060.87 ml  Output --  Net 2060.87 ml    Labs/Imaging Results for orders placed or performed during the hospital encounter of 05/02/20 (from the past 48 hour(s))  Comprehensive metabolic panel     Status: Abnormal   Collection Time: 05/02/20  1:25 PM  Result Value Ref Range   Sodium 127 (L) 135 - 145 mmol/L   Potassium 4.4 3.5 - 5.1 mmol/L   Chloride 93 (L) 98 - 111 mmol/L   CO2 24 22 - 32 mmol/L   Glucose, Bld 92 70 - 99 mg/dL    Comment: Glucose reference range applies only to samples taken after fasting for at least 8 hours.   BUN 12 8 - 23 mg/dL   Creatinine, Ser 5.40 0.61 - 1.24 mg/dL   Calcium 8.4 (L) 8.9 - 10.3 mg/dL   Total Protein 7.2 6.5 - 8.1 g/dL   Albumin 3.9 3.5 - 5.0 g/dL   AST 24 15 - 41 U/L   ALT 15 0 - 44 U/L   Alkaline Phosphatase 98 38 - 126 U/L   Total Bilirubin 0.4 0.3 - 1.2 mg/dL   GFR, Estimated >98 >11 mL/min    Comment: (NOTE) Calculated using the CKD-EPI  Creatinine Equation (2021)    Anion gap 10 5 - 15    Comment: Performed at [facility], [address] [place], [place] [address]  Lactic acid, plasma     Status: None   Collection Time: 05/02/20  1:25 PM  Result Value Ref Range   Lactic Acid, Venous 1.1 0.5 - 1.9 mmol/L    Comment: Performed at [facility], [address] [place], [place] [address]  CBC with Differential     Status: Abnormal   Collection Time: 05/02/20  1:25 PM  Result Value Ref Range   WBC 5.0 4.0 - 10.5 K/uL   RBC 3.61 (L) 4.22 - 5.81 MIL/uL   Hemoglobin 10.4 (L) 13.0 - 17.0 g/dL   HCT 31.2 (L) 39.0 - 52.0 %   MCV 86.4 80.0 - 100.0 fL   MCH 28.8 26.0 - 34.0 pg   MCHC 33.3 30.0 - 36.0 g/dL   RDW 12.9 11.5 - 15.5 %   Platelets 169 150 -  400 K/uL   nRBC 0.0 0.0 - 0.2 %   Neutrophils Relative % 63 %   Neutro Abs 3.1 1.7 - 7.7 K/uL   Lymphocytes Relative 22 %   Lymphs Abs 1.1 0.7 - 4.0 K/uL   Monocytes Relative 14 %   Monocytes Absolute 0.7 0.1 - 1.0 K/uL   Eosinophils Relative 0 %   Eosinophils Absolute 0.0 0.0 - 0.5 K/uL   Basophils Relative 0 %   Basophils Absolute 0.0 0.0 - 0.1 K/uL   Immature Granulocytes 1 %   Abs Immature Granulocytes 0.03 0.00 - 0.07 K/uL    Comment: Performed at [facility], [address], [place], [place] [address]  Protime-INR     Status: None   Collection Time: 05/02/20  1:25 PM  Result Value Ref Range   Prothrombin Time 13.3 11.4 - 15.2 seconds   INR 1.1 0.8 - 1.2    Comment: (NOTE) INR goal varies based on device and disease states. Performed at [facility], [address], [place], [place] [address]   Urinalysis, Routine w reflex microscopic Urine, Catheterized     Status: Abnormal   Collection Time: 05/02/20  1:25 PM  Result Value Ref Range   Color, Urine YELLOW YELLOW   APPearance CLOUDY (A) CLEAR   Specific Gravity, Urine 1.016 1.005 - 1.030   pH 5.0 5.0 - 8.0   Glucose, UA NEGATIVE NEGATIVE mg/dL   Hgb urine dipstick NEGATIVE NEGATIVE   Bilirubin Urine NEGATIVE NEGATIVE   Ketones, ur NEGATIVE NEGATIVE mg/dL   Protein, ur >=300 (A) NEGATIVE mg/dL   Nitrite POSITIVE (A) NEGATIVE   Leukocytes,Ua LARGE (A) NEGATIVE   RBC / HPF 0-5 0 - 5 RBC/hpf   WBC, UA >50 (H) 0 - 5 WBC/hpf   Bacteria, UA FEW (A) NONE SEEN   Squamous Epithelial / LPF 0-5 0 - 5   WBC Clumps PRESENT    Mucus PRESENT     Comment: Performed at [facility], [address], [place],  [address]  Culture, blood (Routine x 2)     Status: None (Preliminary result)   Collection Time: 05/02/20  2:30 PM   Specimen: BLOOD LEFT FOREARM  Result Value Ref Range   Specimen Description BLOOD LEFT FOREARM    Special Requests      BOTTLES DRAWN AEROBIC AND ANAEROBIC Blood Culture results may not be optimal due to  an inadequate volume of blood received in culture bottles   Culture  NO GROWTH < 24 HOURS Performed at [facility], [address], [place], [place] [address]    Report Status PENDING   Resp Panel by RT-PCR (Flu A&B, Covid) Nasopharyngeal Swab     Status: None   Collection Time: 05/02/20  4:15 PM   Specimen: Nasopharyngeal Swab; Nasopharyngeal(NP) swabs in vial transport medium  Result Value Ref Range   SARS Coronavirus 2 by RT PCR NEGATIVE NEGATIVE    Comment: (NOTE) SARS-CoV-2 target nucleic acids are NOT DETECTED.  The SARS-CoV-2 RNA is generally detectable in upper respiratory specimens during the acute phase of infection. The lowest concentration of SARS-CoV-2 viral copies this assay can detect is 138 copies/mL. A negative result does not preclude SARS-Cov-2 infection and should not be used as the sole basis for treatment or other patient management decisions. A negative result may occur with  improper specimen collection/handling, submission of specimen other than nasopharyngeal swab, presence of viral mutation(s) within the areas targeted by this assay, and inadequate number of viral copies(<138 copies/mL). A negative result must be combined with clinical observations, patient history, and epidemiological information. The expected result is Negative.  Fact Sheet for Patients:  EntrepreneurPulse.com.au  Fact Sheet for Healthcare Providers:  IncredibleEmployment.be  This test is no t yet approved or cleared by the [place] FDA and  has been authorized for detection and/or diagnosis of SARS-CoV-2 by FDA under an Emergency Use Authorization (EUA). This EUA will remain  in effect (meaning this test can be used) for the duration of the COVID-19 declaration under Section 564(b)(1) of the Act, 21 U.S.C.section 360bbb-3(b)(1), unless the authorization is terminated  or revoked sooner.       Influenza A by PCR NEGATIVE NEGATIVE    Influenza B by PCR NEGATIVE NEGATIVE    Comment: (NOTE) The Xpert Xpress SARS-CoV-2/FLU/RSV plus assay is intended as an aid in the diagnosis of influenza from Nasopharyngeal swab specimens and should not be used as a sole basis for treatment. Nasal washings and aspirates are unacceptable for Xpert Xpress SARS-CoV-2/FLU/RSV testing.  Fact Sheet for Patients: EntrepreneurPulse.com.au  Fact Sheet for Healthcare Providers: IncredibleEmployment.be  This test is not yet approved or cleared by the [place] FDA and has been authorized for detection and/or diagnosis of SARS-CoV-2 by FDA under an Emergency Use Authorization (EUA). This EUA will remain in effect (meaning this test can be used) for the duration of the COVID-19 declaration under Section 564(b)(1) of the Act, 21 U.S.C. section 360bbb-3(b)(1), unless the authorization is terminated or revoked.  Performed at [facility], [address] [place], [place] [address]   Culture, blood (Routine x 2)     Status: None (Preliminary result)   Collection Time: 05/02/20  7:41 PM   Specimen: BLOOD  Result Value Ref Range   Specimen Description BLOOD BLOOD LEFT WRIST    Special Requests AEROBIC BOTTLE ONLY Blood Culture adequate volume    Culture      NO GROWTH < 24 HOURS Performed at [facility], [address] [place],  [address]    Report Status PENDING   CBG monitoring, ED     Status: None   Collection Time: 05/03/20  7:22 AM  Result Value Ref Range   Glucose-Capillary 86 70 - 99 mg/dL    Comment: Glucose reference range applies only to samples taken after fasting for at least 8 hours.  Basic metabolic panel     Status: Abnormal   Collection Time: 05/03/20  7:36 AM  Result Value Ref Range  Sodium 128 (L) 135 - 145 mmol/L   Potassium 4.1 3.5 - 5.1 mmol/L   Chloride 96 (L) 98 - 111 mmol/L   CO2 24 22 - 32 mmol/L   Glucose, Bld 100 (H) 70 - 99 mg/dL    Comment: Glucose reference  range applies only to samples taken after fasting for at least 8 hours.   BUN 11 8 - 23 mg/dL   Creatinine, Ser 0.68 0.61 - 1.24 mg/dL   Calcium 8.6 (L) 8.9 - 10.3 mg/dL   GFR, Estimated >60 >60 mL/min    Comment: (NOTE) Calculated using the CKD-EPI Creatinine Equation (2021)    Anion gap 8 5 - 15    Comment: Performed at [facility], [address] [place], [place] [address]  CBC     Status: Abnormal   Collection Time: 05/03/20  7:36 AM  Result Value Ref Range   WBC 4.8 4.0 - 10.5 K/uL   RBC 3.93 (L) 4.22 - 5.81 MIL/uL   Hemoglobin 11.3 (L) 13.0 - 17.0 g/dL   HCT 34.5 (L) 39.0 - 52.0 %   MCV 87.8 80.0 - 100.0 fL   MCH 28.8 26.0 - 34.0 pg   MCHC 32.8 30.0 - 36.0 g/dL   RDW 12.8 11.5 - 15.5 %   Platelets 151 150 - 400 K/uL   nRBC 0.0 0.0 - 0.2 %    Comment: Performed at [facility], [address], [place], [place] [address]   DG Chest 1 View  Result Date: 05/02/2020 CLINICAL DATA:  Altered mental status.  Possible sepsis. EXAM: CHEST  1 VIEW COMPARISON:  Single-view of the chest 05/03/2019. FINDINGS: Lung volumes are low but the lungs are clear. Heart size is normal. No pneumothorax or pleural fluid. No acute or focal bony abnormality. IMPRESSION: No acute disease. Electronically Signed   By: Inge Rise M.D.   On: 05/02/2020 13:55   CT Head Wo Contrast  Result Date: 05/02/2020 CLINICAL DATA:  Fever and lethargy. EXAM: CT HEAD WITHOUT CONTRAST TECHNIQUE: Contiguous axial images were obtained from the base of the skull through the vertex without intravenous contrast. COMPARISON:  Head CT 04/26/2019. FINDINGS: Brain: No evidence of acute infarction, hemorrhage, hydrocephalus, extra-axial collection or mass lesion/mass effect. Mild chronic microvascular ischemic change noted. Vascular: No hyperdense vessel or unexpected calcification. Skull: Intact.  No focal lesion. Sinuses/Orbits: Status post cataract surgery. Otherwise unremarkable. Other: None. IMPRESSION: No acute  abnormality. Electronically Signed   By: Inge Rise M.D.   On: 05/02/2020 14:21    Pending Labs Unresulted Labs (From admission, onward)          Start     Ordered   05/03/20 0500  HIV Antibody (routine testing w rflx)  Once,   R        05/03/20 0500   05/02/20 1629  Urine culture  Add-on,   AD        05/02/20 1628          Vitals/Pain Today's Vitals   05/03/20 1130 05/03/20 1156 05/03/20 1200 05/03/20 1230  BP: (!) 145/72 (!) 146/70 (!) 141/71 139/68  Pulse: 72  73 72  Resp: 10  11 12   Temp:      TempSrc:      SpO2: 100%  100% 100%  Weight:      Height:      PainSc:        Isolation Precautions No active isolations  Medications Medications  enoxaparin (LOVENOX) injection 40 mg (40 mg Subcutaneous  Given 05/02/20 2046)  dextrose 5 %-0.9 % sodium chloride infusion ( Intravenous Stopped 05/03/20 1208)  acetaminophen (TYLENOL) tablet 650 mg ( Oral See Alternative 05/02/20 2045)    Or  acetaminophen (TYLENOL) suppository 650 mg (650 mg Rectal Given 05/02/20 2045)  ondansetron (ZOFRAN) tablet 4 mg (has no administration in time range)    Or  ondansetron (ZOFRAN) injection 4 mg (has no administration in time range)  LORazepam (ATIVAN) injection 1-2 mg (has no administration in time range)  carbamazepine (TEGRETOL) tablet 300 mg (300 mg Oral Given 05/03/20 1205)  amLODipine (NORVASC) tablet 5 mg (5 mg Oral Given 05/03/20 1156)  atorvastatin (LIPITOR) tablet 40 mg (has no administration in time range)  aspirin EC tablet 81 mg (81 mg Oral Given 05/03/20 1204)  cefTRIAXone (ROCEPHIN) 1 g in sodium chloride 0.9 % 100 mL IVPB (0 g Intravenous Stopped 05/03/20 1231)  sodium chloride 0.9 % bolus 1,000 mL (0 mLs Intravenous Stopped 05/02/20 1815)    Mobility non-ambulatory High fall risk   Focused Assessments    R Recommendations: See Admitting Provider Note  Report given to:   Additional Notes:

## 2020-05-03 NOTE — Progress Notes (Signed)
PROGRESS NOTE  PAGE LANCON FXT:[id number] DOB: 1950-05-15 DOA: 05/02/2020 PCP: Toma Deiters, MD  Brief History:  69 year old male with a history of cerebral palsy, diabetes mellitus type 2, hypertension, hyperlipidemia, seizure disorder presenting with altered mental status and fever from the [facility].  Apparently, the patient was noted to have a fever up to 103.0 F, and he was noted to be lethargic.  At baseline, the patient is pleasantly confused and able to converse.  At the time of admission, additional history was unobtainable secondary to the patient's acute encephalopathy.  The patient was given 2 g of ceftriaxone IM and acetaminophen by the [facility] prior to his arrival to the emergency department.  There are no reports of tonic-clonic activity, vomiting, diarrhea, respiratory distress, or uncontrolled pain. In the emergency department, the patient was febrile up to 101.8 F, but he was hemodynamically stable with oxygen saturation 100% on room air.  BMP showed a sodium of 127 and serum creatinine 0.87.  LFTs were unremarkable.  WBC 5.0, hemoglobin 10.4, platelets 169,000.  Lactic acid 1.1.  CT of the brain was negative for acute findings.  Chest x-ray was negative for any focal infiltrates. The patient was admitted for altered mental status with fever and started on intravenous fluids and ceftriaxone. Incidentally, the patient also received his COVID-19 vaccine booster on 05/01/2020  Assessment/Plan: Acute metabolic encephalopathy -Secondary to UTI -Mental status slowly improving, but he remains slow to respond -CT brain negative for acute findings -Personally reviewed chest x-ray--poor inspiration, no consolidation -Personally reviewed EKG--sinus rhythm, no concerning ST-T wave change  UTI -UA >50WBC -Urine culture may be compromised as the patient was given ceftriaxone prior to arrival to the emergency department by his skilled nursing facility -Continue  empiric IV ceftriaxone pending urine culture data  Hyponatremia -This appears to be chronic with sodium usually ranging 120-130 -Likely secondary to the patient's Tegretol -Certainly, there is a degree of volume depletion -Continue IV fluids  Seizure disorder -Restart Tegretol  ??  Diabetes mellitus type 2 -04/11/2020 hemoglobin A1c 5.4 -Patient is not on any agents in the outpatient setting  Hyperlipidemia -Continue statin  Essential hypertension -Restart amlodipine      Status is: Inpatient  Remains inpatient appropriate because:IV treatments appropriate due to intensity of illness or inability to take PO   Dispo: The patient is from: SNF              Anticipated d/c is to: SNF              Anticipated d/c date is: 1 day              Patient currently is not medically stable to d/c.        Family Communication:  no Family at bedside  Consultants:  none  Code Status:  FULL  DVT Prophylaxis: [place] Lovenox   Procedures: As Listed in Progress Note Above  Antibiotics: Ceftriaxone 12/30>>>    Subjective: Patient is awake and alert but slow to respond.  He denies any headache, chest pain, shortness breath, coughing, abdominal pain, vomiting or diarrhea.  Remainder review of systems unobtainable secondary to patient's encephalopathy  Objective: Vitals:   05/03/20 0000 05/03/20 0100 05/03/20 0300 05/03/20 0553  BP: 109/60 110/74 112/70 140/88  Pulse: 70 64 65 79  Resp: 12 12 12 13   Temp:    99 F (37.2 C)  TempSrc:    Rectal  SpO2: 100%  99% 100% 100%  Weight:      Height:        Intake/Output Summary (Last 24 hours) at 05/03/2020 0700 Last data filed at 05/02/2020 2300 Gross per 24 hour  Intake 1159.23 ml  Output --  Net 1159.23 ml   Weight change:  Exam:   General:  Pt is alert, follows commands appropriately, not in acute distress  HEENT: No icterus, No thrush, No neck mass, [place]/AT  Cardiovascular: RRR, S1/S2, no rubs, no  gallops  Respiratory: Bibasilar rales but no wheezing.  Good air movement.  Abdomen: Soft/+BS, non tender, non distended, no guarding  Extremities: No edema, No lymphangitis, No petechiae, No rashes, no synovitis   Data Reviewed: I have personally reviewed following labs and imaging studies Basic Metabolic Panel: Recent Labs  Lab 05/02/20 1325  NA 127*  K 4.4  CL 93*  CO2 24  GLUCOSE 92  BUN 12  CREATININE 0.87  CALCIUM 8.4*   Liver Function Tests: Recent Labs  Lab 05/02/20 1325  AST 24  ALT 15  ALKPHOS 98  BILITOT 0.4  PROT 7.2  ALBUMIN 3.9   No results for input(s): LIPASE, AMYLASE in the last 168 hours. No results for input(s): AMMONIA in the last 168 hours. Coagulation Profile: Recent Labs  Lab 05/02/20 1325  INR 1.1   CBC: Recent Labs  Lab 05/02/20 1325  WBC 5.0  NEUTROABS 3.1  HGB 10.4*  HCT 31.2*  MCV 86.4  PLT 169   Cardiac Enzymes: No results for input(s): CKTOTAL, CKMB, CKMBINDEX, TROPONINI in the last 168 hours. BNP: Invalid input(s): POCBNP CBG: No results for input(s): GLUCAP in the last 168 hours. HbA1C: No results for input(s): HGBA1C in the last 72 hours. Urine analysis:    Component Value Date/Time   COLORURINE YELLOW 05/02/2020 1325   APPEARANCEUR CLOUDY (A) 05/02/2020 1325   LABSPEC 1.016 05/02/2020 1325   PHURINE 5.0 05/02/2020 1325   GLUCOSEU NEGATIVE 05/02/2020 1325   HGBUR NEGATIVE 05/02/2020 1325   [place] 05/02/2020 1325   [place] 05/02/2020 1325   PROTEINUR >=300 (A) 05/02/2020 1325   UROBILINOGEN 0.2 07/25/2014 1828   NITRITE POSITIVE (A) 05/02/2020 1325   LEUKOCYTESUR LARGE (A) 05/02/2020 1325   Sepsis Labs: @LABRCNTIP (procalcitonin:4,lacticidven:4) ) Recent Results (from the past 240 hour(s))  Culture, blood (Routine x 2)     Status: None (Preliminary result)   Collection Time: 05/02/20  2:30 PM   Specimen: BLOOD LEFT FOREARM  Result Value Ref Range Status   Specimen Description  BLOOD LEFT FOREARM  Final   Special Requests   Final    BOTTLES DRAWN AEROBIC AND ANAEROBIC Blood Culture results may not be optimal due to an inadequate volume of blood received in culture bottles   Culture   Final    NO GROWTH < 24 HOURS Performed at [facility], [address], [place], [place] [address]    Report Status PENDING  Incomplete  Resp Panel by RT-PCR (Flu A&B, Covid) Nasopharyngeal Swab     Status: None   Collection Time: 05/02/20  4:15 PM   Specimen: Nasopharyngeal Swab; Nasopharyngeal(NP) swabs in vial transport medium  Result Value Ref Range Status   SARS Coronavirus 2 by RT PCR NEGATIVE NEGATIVE Final    Comment: (NOTE) SARS-CoV-2 target nucleic acids are NOT DETECTED.  The SARS-CoV-2 RNA is generally detectable in upper respiratory specimens during the acute phase of infection. The lowest concentration of SARS-CoV-2 viral copies this assay can detect is 138 copies/mL. A  negative result does not preclude SARS-Cov-2 infection and should not be used as the sole basis for treatment or other patient management decisions. A negative result may occur with  improper specimen collection/handling, submission of specimen other than nasopharyngeal swab, presence of viral mutation(s) within the areas targeted by this assay, and inadequate number of viral copies(<138 copies/mL). A negative result must be combined with clinical observations, patient history, and epidemiological information. The expected result is Negative.  Fact Sheet for Patients:  EntrepreneurPulse.com.au  Fact Sheet for Healthcare Providers:  IncredibleEmployment.be  This test is no t yet approved or cleared by the [place] FDA and  has been authorized for detection and/or diagnosis of SARS-CoV-2 by FDA under an Emergency Use Authorization (EUA). This EUA will remain  in effect (meaning this test can be used) for the duration of the COVID-19 declaration under  Section 564(b)(1) of the Act, 21 U.S.C.section 360bbb-3(b)(1), unless the authorization is terminated  or revoked sooner.       Influenza A by PCR NEGATIVE NEGATIVE Final   Influenza B by PCR NEGATIVE NEGATIVE Final    Comment: (NOTE) The Xpert Xpress SARS-CoV-2/FLU/RSV plus assay is intended as an aid in the diagnosis of influenza from Nasopharyngeal swab specimens and should not be used as a sole basis for treatment. Nasal washings and aspirates are unacceptable for Xpert Xpress SARS-CoV-2/FLU/RSV testing.  Fact Sheet for Patients: EntrepreneurPulse.com.au  Fact Sheet for Healthcare Providers: IncredibleEmployment.be  This test is not yet approved or cleared by the [place] FDA and has been authorized for detection and/or diagnosis of SARS-CoV-2 by FDA under an Emergency Use Authorization (EUA). This EUA will remain in effect (meaning this test can be used) for the duration of the COVID-19 declaration under Section 564(b)(1) of the Act, 21 U.S.C. section 360bbb-3(b)(1), unless the authorization is terminated or revoked.  Performed at [facility], [address] [place], [place] [address]   Culture, blood (Routine x 2)     Status: None (Preliminary result)   Collection Time: 05/02/20  7:41 PM   Specimen: BLOOD  Result Value Ref Range Status   Specimen Description BLOOD BLOOD LEFT WRIST  Final   Special Requests AEROBIC BOTTLE ONLY Blood Culture adequate volume  Final   Culture   Final    NO GROWTH < 12 HOURS Performed at [facility], [address], [place], [place] [address]    Report Status PENDING  Incomplete     Scheduled Meds: . enoxaparin (LOVENOX) injection  40 mg Subcutaneous Q24H   Continuous Infusions: . dextrose 5 % and 0.9% NaCl 75 mL/hr at 05/03/20 [id number]    Procedures/Studies: DG Chest 1 View  Result Date: 05/02/2020 CLINICAL DATA:  Altered mental status.  Possible sepsis. EXAM: CHEST  1 VIEW COMPARISON:   Single-view of the chest 05/03/2019. FINDINGS: Lung volumes are low but the lungs are clear. Heart size is normal. No pneumothorax or pleural fluid. No acute or focal bony abnormality. IMPRESSION: No acute disease. Electronically Signed   By: Inge Rise M.D.   On: 05/02/2020 13:55   CT Head Wo Contrast  Result Date: 05/02/2020 CLINICAL DATA:  Fever and lethargy. EXAM: CT HEAD WITHOUT CONTRAST TECHNIQUE: Contiguous axial images were obtained from the base of the skull through the vertex without intravenous contrast. COMPARISON:  Head CT 04/26/2019. FINDINGS: Brain: No evidence of acute infarction, hemorrhage, hydrocephalus, extra-axial collection or mass lesion/mass effect. Mild chronic microvascular ischemic change noted. Vascular: No hyperdense vessel or unexpected calcification. Skull: Intact.  No  focal lesion. Sinuses/Orbits: Status post cataract surgery. Otherwise unremarkable. Other: None. IMPRESSION: No acute abnormality. Electronically Signed   By: Inge Rise M.D.   On: 05/02/2020 14:21    Orson Eva, DO  [facility]  If 7PM-7AM, please contact night-coverage [email or web address] Password [email or web address] 05/03/2020, 7:00 AM   LOS: 1 day

## 2020-05-03 NOTE — ED Notes (Signed)
Attempted report x1. 

## 2020-05-03 NOTE — ED Notes (Signed)
Pt reports he takes his medications in applesauce to help his swallowing. No applesauce currently available in ED. Nurse Secretary called dietary staff and they will be bringing applesauce soon. RN to administer PO medications when applesauce is available for safety of pt.

## 2020-05-04 DIAGNOSIS — N3000 Acute cystitis without hematuria: Secondary | ICD-10-CM | POA: Diagnosis not present

## 2020-05-04 DIAGNOSIS — G808 Other cerebral palsy: Secondary | ICD-10-CM | POA: Diagnosis not present

## 2020-05-04 DIAGNOSIS — I10 Essential (primary) hypertension: Secondary | ICD-10-CM | POA: Diagnosis not present

## 2020-05-04 DIAGNOSIS — G9341 Metabolic encephalopathy: Secondary | ICD-10-CM | POA: Diagnosis not present

## 2020-05-04 LAB — BASIC METABOLIC PANEL
Anion gap: 8 (ref 5–15)
BUN: 13 mg/dL (ref 8–23)
CO2: 24 mmol/L (ref 22–32)
Calcium: 8.6 mg/dL — ABNORMAL LOW (ref 8.9–10.3)
Chloride: 97 mmol/L — ABNORMAL LOW (ref 98–111)
Creatinine, Ser: 0.7 mg/dL (ref 0.61–1.24)
GFR, Estimated: >60 mL/min (ref >60)
Glucose, Bld: 99 mg/dL (ref 70–99)
Potassium: 3.8 mmol/L (ref 3.5–5.1)
Sodium: 129 mmol/L — ABNORMAL LOW (ref 135–145)

## 2020-05-04 LAB — CBC
HCT: 28.1 % — ABNORMAL LOW (ref 39.0–52.0)
Hemoglobin: 9.3 g/dL — ABNORMAL LOW (ref 13.0–17.0)
MCH: 28.7 pg (ref 26.0–34.0)
MCHC: 33.1 g/dL (ref 30.0–36.0)
MCV: 86.7 fL (ref 80.0–100.0)
Platelets: 146 10*3/uL — ABNORMAL LOW (ref 150–400)
RBC: 3.24 MIL/uL — ABNORMAL LOW (ref 4.22–5.81)
RDW: 12.8 % (ref 11.5–15.5)
WBC: 2.7 10*3/uL — ABNORMAL LOW (ref 4.0–10.5)
nRBC: 0 % (ref 0.0–0.2)

## 2020-05-04 LAB — GLUCOSE, CAPILLARY: Glucose-Capillary: 105 mg/dL — ABNORMAL HIGH (ref 70–99)

## 2020-05-04 LAB — MAGNESIUM: Magnesium: 1.9 mg/dL (ref 1.7–2.4)

## 2020-05-04 MED ORDER — SODIUM CHLORIDE 0.9 % IV SOLN
INTRAVENOUS | Status: DC | PRN
  Administered 2020-05-04: 500 mL via INTRAVENOUS

## 2020-05-04 MED ORDER — CEFDINIR 300 MG PO CAPS: 300.0000 mg | ORAL_CAPSULE | Freq: Two times a day (BID) | ORAL | Status: DC

## 2020-05-04 MED ORDER — CEFDINIR 300 MG PO CAPS: 300.0000 mg | ORAL_CAPSULE | Freq: Two times a day (BID) | ORAL | 0 refills | Status: AC

## 2020-05-04 NOTE — Progress Notes (Signed)
Report called to [facility] at [facility] in [place].  [facility] contacted over 4 hours ago and did not know when they could transport due to having so many calls

## 2020-05-04 NOTE — Discharge Summary (Addendum)
Physician Discharge Summary  Jeremy Parks NWG:[id number] DOB: 1950-08-20 DOA: 05/02/2020  PCP: Toma Deiters, MD  Admit date: 05/02/2020 Discharge date: 05/04/2020  Admitted From: SNF Disposition:  SNF  Recommendations for Outpatient Follow-up:  1. Follow up with PCP in 1-2 weeks 2. Please obtain BMP/CBC in one week 3. May need to consider switching tegretol to different agent if Na drops again  Discharge Condition: Stable CODE STATUS: FULL Diet recommendation: dysphagia 3 with thin liquids   Brief/Interim Summary: 70 year old male with a history of cerebral palsy, diabetes mellitus type 2, hypertension, hyperlipidemia, seizure disorder presenting with altered mental status and fever from the [facility].  Apparently, the patient was noted to have a fever up to 103.0 F, and he was noted to be lethargic.  At baseline, the patient is pleasantly confused and able to converse.  At the time of admission, additional history was unobtainable secondary to the patient's acute encephalopathy.  The patient was given 2 g of ceftriaxone IM and acetaminophen by the [facility] prior to his arrival to the emergency department.  There are no reports of tonic-clonic activity, vomiting, diarrhea, respiratory distress, or uncontrolled pain. In the emergency department, the patient was febrile up to 101.8 F, but he was hemodynamically stable with oxygen saturation 100% on room air.  BMP showed a sodium of 127 and serum creatinine 0.87.  LFTs were unremarkable.  WBC 5.0, hemoglobin 10.4, platelets 169,000.  Lactic acid 1.1.  CT of the brain was negative for acute findings.  Chest x-ray was negative for any focal infiltrates. The patient was admitted for altered mental status with fever and started on intravenous fluids and ceftriaxone. Incidentally, the patient also received his COVID-19 vaccine booster on 05/01/2020  Discharge Diagnoses:   Acute metabolic encephalopathy -Secondary to UTI -Mental  status back to baseline at time of d/c--patient conversant--aware it's "New Year's" and he's in hospital -Personally reviewed chest x-ray--poor inspiration, no consolidation -Personally reviewed EKG--sinus rhythm, no concerning ST-T wave change  UTI--[place]id[place] sp -UA >50WBC -Urine culture may be compromised as the patient was given ceftriaxone prior to arrival to the emergency department by his skilled nursing facility -Continue empiric IV ceftriaxone pending urine culture data -blood cultures neg at time of d/c -d/c with cefdinir x  3 more days  Hyponatremia -This appears to be chronic with sodium usually ranging 120-130 -Likely secondary to the patient's Tegretol -Certainly, there is a degree of volume depletion -Continue IV fluids>>improving -Na 129 on day of dc  Seizure disorder -Restart Tegretol but may need to consider switching to different agent if hyponatremia worsens  ??  Diabetes mellitus type 2 -04/11/2020 hemoglobin A1c 5.4 -Patient is not on any agents in the outpatient setting  Hyperlipidemia -Continue statin  Essential hypertension -Restart amlodipine  Discharge Instructions   Allergies as of 05/04/2020   No Known Allergies     Medication List    TAKE these medications   amLODipine 10 MG tablet Commonly known as: NORVASC Take 1 tablet (10 mg total) by mouth daily.   ascorbic acid 500 MG tablet Commonly known as: VITAMIN C Take 500 mg by mouth 2 (two) times daily.   aspirin 81 MG chewable tablet Chew 81 mg by mouth daily.   atorvastatin 40 MG tablet Commonly known as: LIPITOR Take 40 mg by mouth at bedtime.   carbamazepine 200 MG tablet Commonly known as: TEGretol Take 1.5 tablets (300 mg total) by mouth 2 (two) times daily.   cefdinir 300 MG capsule Commonly  known as: OMNICEF Take 1 capsule (300 mg total) by mouth every 12 (twelve) hours. X 3 days Start taking on: May 05, 2020   chlorhexidine 0.12 % solution Commonly known as:  PERIDEX Use as directed 15 mLs in the mouth or throat daily. **Swish 15 mls for 30 seconds then spit out. USE DAILY**   docusate 50 MG/5ML liquid Commonly known as: COLACE Take 100 mg by mouth 2 (two) times daily as needed for mild constipation.   ferrous sulfate 325 (65 FE) MG tablet Take 325 mg by mouth 2 (two) times daily with a meal.   polyethylene glycol 17 g packet Commonly known as: MIRALAX / GLYCOLAX Take 17 g by mouth daily.       No Known Allergies  Consultations:  none   Procedures/Studies: DG Chest 1 View  Result Date: 05/02/2020 CLINICAL DATA:  Altered mental status.  Possible sepsis. EXAM: CHEST  1 VIEW COMPARISON:  Single-view of the chest 05/03/2019. FINDINGS: Lung volumes are low but the lungs are clear. Heart size is normal. No pneumothorax or pleural fluid. No acute or focal bony abnormality. IMPRESSION: No acute disease. Electronically Signed   By: Inge Rise M.D.   On: 05/02/2020 13:55   CT Head Wo Contrast  Result Date: 05/02/2020 CLINICAL DATA:  Fever and lethargy. EXAM: CT HEAD WITHOUT CONTRAST TECHNIQUE: Contiguous axial images were obtained from the base of the skull through the vertex without intravenous contrast. COMPARISON:  Head CT 04/26/2019. FINDINGS: Brain: No evidence of acute infarction, hemorrhage, hydrocephalus, extra-axial collection or mass lesion/mass effect. Mild chronic microvascular ischemic change noted. Vascular: No hyperdense vessel or unexpected calcification. Skull: Intact.  No focal lesion. Sinuses/Orbits: Status post cataract surgery. Otherwise unremarkable. Other: None. IMPRESSION: No acute abnormality. Electronically Signed   By: Inge Rise M.D.   On: 05/02/2020 14:21         Discharge Exam: Vitals:   05/03/20 2111 05/04/20 0500  BP: 122/68 (!) 108/58  Pulse: 75 64  Resp: 16 16  Temp: 98.5 F (36.9 C) 97.7 F (36.5 C)  SpO2: 100% 100%   Vitals:   05/03/20 1402 05/03/20 1916 05/03/20 2111 05/04/20 0500   BP: 134/69 114/78 122/68 (!) 108/58  Pulse: 76 77 75 64  Resp: 17 18 16 16   Temp: 98.8 F (37.1 C) 98.6 F (37 C) 98.5 F (36.9 C) 97.7 F (36.5 C)  TempSrc: Oral Oral Oral Oral  SpO2: 100% 100% 100% 100%  Weight:      Height:        General: Pt is alert, awake, not in acute distress Cardiovascular: RRR, S1/S2 +, no rubs, no gallops Respiratory: fine bibasilar crackles. No wheeze Abdominal: Soft, NT, ND, bowel sounds + Extremities: no edema, no cyanosis   The results of significant diagnostics from this hospitalization (including imaging, microbiology, ancillary and laboratory) are listed below for reference.    Significant Diagnostic Studies: DG Chest 1 View  Result Date: 05/02/2020 CLINICAL DATA:  Altered mental status.  Possible sepsis. EXAM: CHEST  1 VIEW COMPARISON:  Single-view of the chest 05/03/2019. FINDINGS: Lung volumes are low but the lungs are clear. Heart size is normal. No pneumothorax or pleural fluid. No acute or focal bony abnormality. IMPRESSION: No acute disease. Electronically Signed   By: Inge Rise M.D.   On: 05/02/2020 13:55   CT Head Wo Contrast  Result Date: 05/02/2020 CLINICAL DATA:  Fever and lethargy. EXAM: CT HEAD WITHOUT CONTRAST TECHNIQUE: Contiguous axial images were obtained from the base  of the skull through the vertex without intravenous contrast. COMPARISON:  Head CT 04/26/2019. FINDINGS: Brain: No evidence of acute infarction, hemorrhage, hydrocephalus, extra-axial collection or mass lesion/mass effect. Mild chronic microvascular ischemic change noted. Vascular: No hyperdense vessel or unexpected calcification. Skull: Intact.  No focal lesion. Sinuses/Orbits: Status post cataract surgery. Otherwise unremarkable. Other: None. IMPRESSION: No acute abnormality. Electronically Signed   By: Inge Rise M.D.   On: 05/02/2020 14:21     Microbiology: Recent Results (from the past 240 hour(s))  Culture, blood (Routine x 2)     Status:  None (Preliminary result)   Collection Time: 05/02/20  2:30 PM   Specimen: BLOOD LEFT FOREARM  Result Value Ref Range Status   Specimen Description BLOOD LEFT FOREARM  Final   Special Requests   Final    BOTTLES DRAWN AEROBIC AND ANAEROBIC Blood Culture results may not be optimal due to an inadequate volume of blood received in culture bottles   Culture   Final    NO GROWTH 2 DAYS Performed at [facility], [address], [place], [place] [address]    Report Status PENDING  Incomplete  Resp Panel by RT-PCR (Flu A&B, Covid) Nasopharyngeal Swab     Status: None   Collection Time: 05/02/20  4:15 PM   Specimen: Nasopharyngeal Swab; Nasopharyngeal(NP) swabs in vial transport medium  Result Value Ref Range Status   SARS Coronavirus 2 by RT PCR NEGATIVE NEGATIVE Final    Comment: (NOTE) SARS-CoV-2 target nucleic acids are NOT DETECTED.  The SARS-CoV-2 RNA is generally detectable in upper respiratory specimens during the acute phase of infection. The lowest concentration of SARS-CoV-2 viral copies this assay can detect is 138 copies/mL. A negative result does not preclude SARS-Cov-2 infection and should not be used as the sole basis for treatment or other patient management decisions. A negative result may occur with  improper specimen collection/handling, submission of specimen other than nasopharyngeal swab, presence of viral mutation(s) within the areas targeted by this assay, and inadequate number of viral copies(<138 copies/mL). A negative result must be combined with clinical observations, patient history, and epidemiological information. The expected result is Negative.  Fact Sheet for Patients:  EntrepreneurPulse.com.au  Fact Sheet for Healthcare Providers:  IncredibleEmployment.be  This test is no t yet approved or cleared by the [place] FDA and  has been authorized for detection and/or diagnosis of SARS-CoV-2 by FDA under an  Emergency Use Authorization (EUA). This EUA will remain  in effect (meaning this test can be used) for the duration of the COVID-19 declaration under Section 564(b)(1) of the Act, 21 U.S.C.section 360bbb-3(b)(1), unless the authorization is terminated  or revoked sooner.       Influenza A by PCR NEGATIVE NEGATIVE Final   Influenza B by PCR NEGATIVE NEGATIVE Final    Comment: (NOTE) The Xpert Xpress SARS-CoV-2/FLU/RSV plus assay is intended as an aid in the diagnosis of influenza from Nasopharyngeal swab specimens and should not be used as a sole basis for treatment. Nasal washings and aspirates are unacceptable for Xpert Xpress SARS-CoV-2/FLU/RSV testing.  Fact Sheet for Patients: EntrepreneurPulse.com.au  Fact Sheet for Healthcare Providers: IncredibleEmployment.be  This test is not yet approved or cleared by the [place] FDA and has been authorized for detection and/or diagnosis of SARS-CoV-2 by FDA under an Emergency Use Authorization (EUA). This EUA will remain in effect (meaning this test can be used) for the duration of the COVID-19 declaration under Section 564(b)(1) of the Act, 21 U.S.C. section 360bbb-3(b)(1),  unless the authorization is terminated or revoked.  Performed at [facility], [address] [place], [place] [address]   Culture, blood (Routine x 2)     Status: None (Preliminary result)   Collection Time: 05/02/20  7:41 PM   Specimen: BLOOD  Result Value Ref Range Status   Specimen Description BLOOD BLOOD LEFT WRIST  Final   Special Requests AEROBIC BOTTLE ONLY Blood Culture adequate volume  Final   Culture   Final    NO GROWTH 2 DAYS Performed at [facility], [address], [place], [place] [address]    Report Status PENDING  Incomplete     Labs: Basic Metabolic Panel: Recent Labs  Lab 05/02/20 1325 05/03/20 0736 05/04/20 0708  NA 127* 128* 129*  K 4.4 4.1 3.8  CL 93* 96* 97*  CO2 24 24 24   GLUCOSE  92 100* 99  BUN 12 11 13   CREATININE 0.87 0.68 0.70  CALCIUM 8.4* 8.6* 8.6*  MG  --   --  1.9   Liver Function Tests: Recent Labs  Lab 05/02/20 1325  AST 24  ALT 15  ALKPHOS 98  BILITOT 0.4  PROT 7.2  ALBUMIN 3.9   No results for input(s): LIPASE, AMYLASE in the last 168 hours. No results for input(s): AMMONIA in the last 168 hours. CBC: Recent Labs  Lab 05/02/20 1325 05/03/20 0736 05/04/20 0708  WBC 5.0 4.8 2.7*  NEUTROABS 3.1  --   --   HGB 10.4* 11.3* 9.3*  HCT 31.2* 34.5* 28.1*  MCV 86.4 87.8 86.7  PLT 169 151 146*   Cardiac Enzymes: No results for input(s): CKTOTAL, CKMB, CKMBINDEX, TROPONINI in the last 168 hours. BNP: Invalid input(s): POCBNP CBG: Recent Labs  Lab 05/03/20 0722 05/04/20 0542  GLUCAP 86 105*    Time coordinating discharge:  36 minutes  Signed:  Orson Eva, DO [facility] Pager: [phone] 05/04/2020, 10:51 AM

## 2020-05-04 NOTE — [place] FL2 (Signed)
[place] MEDICAID FL2 LEVEL OF CARE SCREENING TOOL     IDENTIFICATION  Patient Name: Jeremy Parks Birthdate: Jan 31, 1951 Sex: male Admission Date (Current Location): 05/02/2020  [facility] and [place] Number:  [employer] and Address:  [facility],  [address], [place] [address]      Provider Number: [id number]  Attending Physician Name and Address:  Catarina Hartshorn, MD  Relative Name and Phone Number:  Lennox Grumbles    Current Level of Care: Hospital Recommended Level of Care: Skilled Nursing Facility Prior Approval Number:    Date Approved/Denied: 05/09/19 PASRR Number: [id number]  Discharge Plan: SNF    Current Diagnoses: Patient Active Problem List   Diagnosis Date Noted  . Acute metabolic encephalopathy 05/03/2020  . Hyponatremia 05/03/2020  . Metabolic encephalopathy 05/02/2020  . UTI (urinary tract infection) 05/02/2020  . Palliative care by specialist   . Goals of care, counseling/discussion   . Urinary tract infection with hematuria   . Delirium   . Pneumonia due to COVID-19 virus 04/27/2019  . Normocytic anemia 04/27/2019  . Hyperglycemia 04/27/2019  . Hyperlipidemia 04/27/2019  . Essential hypertension 10/30/2018  . Hypertensive urgency 10/28/2018  . Cerebral palsy (HCC)   . Diabetes mellitus without complication (HCC)   . Seizures (HCC)     Orientation RESPIRATION BLADDER Height & Weight     Self,Place  Normal Incontinent Weight: 138 lb (62.6 kg) Height:  5\' 8"  (172.7 cm)  BEHAVIORAL SYMPTOMS/MOOD NEUROLOGICAL BOWEL NUTRITION STATUS      Incontinent Diet (Room service appropriate? Yes; Fluid consistency: Thin)  AMBULATORY STATUS COMMUNICATION OF NEEDS Skin   Limited Assist Verbally Normal                       Personal Care Assistance Level of Assistance  Bathing,Feeding,Dressing Bathing Assistance: Limited assistance Feeding assistance: Independent Dressing Assistance: Limited assistance     Functional  Limitations Info  Sight,Hearing,Speech Sight Info: Adequate Hearing Info: Adequate Speech Info: Adequate    SPECIAL CARE FACTORS FREQUENCY                       Contractures Contractures Info: Not present    Additional Factors Info  Code Status,Allergies Code Status Info: Full Allergies Info: N/A           Current Medications (05/04/2020):  This is the current hospital active medication list Current Facility-Administered Medications  Medication Dose Route Frequency Provider Last Rate Last Admin  . 0.9 %  sodium chloride infusion   Intravenous PRN 07/02/2020, MD 10 mL/hr at 05/04/20 0936 500 mL at 05/04/20 0936  . acetaminophen (TYLENOL) tablet 650 mg  650 mg Oral Q6H PRN Emokpae, Ejiroghene E, MD       Or  . acetaminophen (TYLENOL) suppository 650 mg  650 mg Rectal Q6H PRN Emokpae, Ejiroghene E, MD   650 mg at 05/02/20 2045  . amLODipine (NORVASC) tablet 5 mg  5 mg Oral Daily Tat, David, MD   5 mg at 05/04/20 0940  . aspirin EC tablet 81 mg  81 mg Oral Daily Tat, 07/02/20, MD   81 mg at 05/04/20 0939  . atorvastatin (LIPITOR) tablet 40 mg  40 mg Oral Q supper Tat, 07/02/20, MD   40 mg at 05/03/20 1756  . carbamazepine (TEGRETOL) tablet 300 mg  300 mg Oral BID 05/05/20, MD   300 mg at 05/04/20 0939  . [START ON 05/05/2020] cefdinir (OMNICEF) capsule 300  mg  300 mg Oral Q12H Tat, David, MD      . cefTRIAXone (ROCEPHIN) 1 g in sodium chloride 0.9 % 100 mL IVPB  1 g Intravenous Q24H Orson Eva, MD 200 mL/hr at 05/04/20 0939 1 g at 05/04/20 0939  . enoxaparin (LOVENOX) injection 40 mg  40 mg Subcutaneous Q24H Emokpae, Ejiroghene E, MD   40 mg at 05/03/20 2111  . LORazepam (ATIVAN) injection 1-2 mg  1-2 mg Intravenous Q5 min PRN Emokpae, Ejiroghene E, MD      . ondansetron (ZOFRAN) tablet 4 mg  4 mg Oral Q6H PRN Emokpae, Ejiroghene E, MD       Or  . ondansetron (ZOFRAN) injection 4 mg  4 mg Intravenous Q6H PRN Emokpae, Ejiroghene E, MD         Discharge Medications: Please see  discharge summary for a list of discharge medications.  Relevant Imaging Results:  Relevant Lab Results:   Additional Information Pt SSN; [id number]  Natasha Bence, LCSW

## 2020-05-04 NOTE — TOC Transition Note (Addendum)
Transition of Care [facility]) - CM/SW Discharge Note   Patient Details  Name: EMRAN MOLZAHN MRN: [id number] Date of Birth: 06/03/1950  Transition of Care [facility]) CM/SW Contact:  Barry Brunner, LCSW Phone Number: 05/04/2020, 1:54 PM   Clinical Narrative:    CSW verified that patient could return to facility today. Revonda Standard with [facility] confirmed that patient could return to facility and that initial Covid test was sufficient for return. CSW contacted EMS. Med necessity completed by nurse. CSW faxed FL2 and D/C summary. TOC signing off.    Final next level of care: Skilled Nursing Facility Barriers to Discharge: Barriers Resolved   Patient Goals and CMS Choice Patient states their goals for this hospitalization and ongoing recovery are:: Return to SNF CMS Medicare.gov Compare Post Acute Care list provided to:: Patient Choice offered to / list presented to : Patient  Discharge Placement                Patient to be transferred to facility by: [facility] Name of family member notified: Lennox Grumbles Patient and family notified of of transfer: 06/03/20  Discharge Plan and Services                DME Arranged: N/A DME Agency: NA       HH Arranged: NA HH Agency: NA        Social Determinants of Health (SDOH) Interventions     Readmission Risk Interventions Readmission Risk Prevention Plan 05/04/2020 05/09/2019 10/31/2018  Post Dischage Appt - - Not Complete  Appt Comments - - Dr Juanetta Gosling says he plans to arrange virtual visit with pt and will schedule it himself with [facility] staff  Medication Screening - - Complete  Transportation Screening Complete Complete Complete  PCP or Specialist Appt within 5-7 Days Complete - -  Home Care Screening Complete Not Complete -  Home Care Screening Not Completed Comments - Pt to go to SNF rehab -  Medication Review (RN CM) Complete Complete -  Some recent data might be hidden

## 2020-05-05 LAB — URINE CULTURE: Culture: 70000 — AB

## 2020-05-08 LAB — CULTURE, BLOOD (ROUTINE X 2)
Culture: NO GROWTH
Culture: NO GROWTH
Special Requests: ADEQUATE

## 2022-12-03 DEATH — deceased
